# Patient Record
Sex: Female | Born: 1970 | Race: White | Hispanic: No | Marital: Married | Smoking: Former smoker
Health system: Southern US, Community
[De-identification: ages and names within clinical notes are randomized; demographics above are authoritative.]

## PROBLEM LIST (undated history)

## (undated) ENCOUNTER — Ambulatory Visit: Admission: EM | Source: Home / Self Care

## (undated) DIAGNOSIS — F419 Anxiety disorder, unspecified: Secondary | ICD-10-CM

## (undated) DIAGNOSIS — I340 Nonrheumatic mitral (valve) insufficiency: Secondary | ICD-10-CM

## (undated) DIAGNOSIS — T7840XA Allergy, unspecified, initial encounter: Secondary | ICD-10-CM

## (undated) DIAGNOSIS — R079 Chest pain, unspecified: Secondary | ICD-10-CM

## (undated) DIAGNOSIS — G629 Polyneuropathy, unspecified: Secondary | ICD-10-CM

## (undated) DIAGNOSIS — F329 Major depressive disorder, single episode, unspecified: Secondary | ICD-10-CM

## (undated) DIAGNOSIS — I071 Rheumatic tricuspid insufficiency: Secondary | ICD-10-CM

## (undated) DIAGNOSIS — I10 Essential (primary) hypertension: Secondary | ICD-10-CM

## (undated) DIAGNOSIS — J449 Chronic obstructive pulmonary disease, unspecified: Secondary | ICD-10-CM

## (undated) DIAGNOSIS — M1990 Unspecified osteoarthritis, unspecified site: Secondary | ICD-10-CM

## (undated) DIAGNOSIS — E785 Hyperlipidemia, unspecified: Secondary | ICD-10-CM

## (undated) DIAGNOSIS — Z87442 Personal history of urinary calculi: Secondary | ICD-10-CM

## (undated) DIAGNOSIS — N12 Tubulo-interstitial nephritis, not specified as acute or chronic: Secondary | ICD-10-CM

## (undated) DIAGNOSIS — K219 Gastro-esophageal reflux disease without esophagitis: Secondary | ICD-10-CM

## (undated) DIAGNOSIS — J45909 Unspecified asthma, uncomplicated: Secondary | ICD-10-CM

## (undated) DIAGNOSIS — F32A Depression, unspecified: Secondary | ICD-10-CM

## (undated) DIAGNOSIS — M810 Age-related osteoporosis without current pathological fracture: Secondary | ICD-10-CM

## (undated) DIAGNOSIS — F319 Bipolar disorder, unspecified: Secondary | ICD-10-CM

## (undated) DIAGNOSIS — G4700 Insomnia, unspecified: Secondary | ICD-10-CM

## (undated) HISTORY — DX: Unspecified osteoarthritis, unspecified site: M19.90

## (undated) HISTORY — DX: Anxiety disorder, unspecified: F41.9

## (undated) HISTORY — DX: Major depressive disorder, single episode, unspecified: F32.9

## (undated) HISTORY — DX: Chronic obstructive pulmonary disease, unspecified: J44.9

## (undated) HISTORY — PX: CERVICAL FUSION: SHX112

## (undated) HISTORY — PX: SPINE SURGERY: SHX786

## (undated) HISTORY — PX: TUBAL LIGATION: SHX77

## (undated) HISTORY — DX: Age-related osteoporosis without current pathological fracture: M81.0

## (undated) HISTORY — PX: APPENDECTOMY: SHX54

## (undated) HISTORY — DX: Hyperlipidemia, unspecified: E78.5

## (undated) HISTORY — PX: BACK SURGERY: SHX140

## (undated) HISTORY — DX: Polyneuropathy, unspecified: G62.9

## (undated) HISTORY — DX: Depression, unspecified: F32.A

## (undated) HISTORY — DX: Allergy, unspecified, initial encounter: T78.40XA

## (undated) HISTORY — DX: Insomnia, unspecified: G47.00

## (undated) HISTORY — DX: Bipolar disorder, unspecified: F31.9

## (undated) HISTORY — DX: Essential (primary) hypertension: I10

---
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
## 1998-10-28 HISTORY — PX: ABDOMINAL HYSTERECTOMY: SHX81

## 2005-10-21 ENCOUNTER — Emergency Department: Payer: Self-pay | Admitting: Emergency Medicine

## 2006-02-24 ENCOUNTER — Ambulatory Visit: Payer: Self-pay | Admitting: Ophthalmology

## 2007-02-01 ENCOUNTER — Inpatient Hospital Stay: Payer: Self-pay | Admitting: Unknown Physician Specialty

## 2007-12-07 ENCOUNTER — Emergency Department (HOSPITAL_COMMUNITY): Admission: EM | Admit: 2007-12-07 | Discharge: 2007-12-08 | Payer: Self-pay | Admitting: Family Medicine

## 2009-09-27 ENCOUNTER — Emergency Department (HOSPITAL_COMMUNITY): Admission: EM | Admit: 2009-09-27 | Discharge: 2009-09-27 | Payer: Self-pay | Admitting: Emergency Medicine

## 2010-01-16 ENCOUNTER — Ambulatory Visit (HOSPITAL_COMMUNITY): Admission: RE | Admit: 2010-01-16 | Discharge: 2010-01-16 | Payer: Self-pay | Admitting: Neurosurgery

## 2010-01-25 ENCOUNTER — Ambulatory Visit (HOSPITAL_COMMUNITY): Admission: RE | Admit: 2010-01-25 | Discharge: 2010-01-26 | Payer: Self-pay | Admitting: Neurosurgery

## 2010-03-14 ENCOUNTER — Encounter: Payer: Self-pay | Admitting: Neurosurgery

## 2010-03-28 ENCOUNTER — Encounter: Payer: Self-pay | Admitting: Neurosurgery

## 2010-04-27 ENCOUNTER — Encounter: Payer: Self-pay | Admitting: Neurosurgery

## 2010-06-12 ENCOUNTER — Ambulatory Visit (HOSPITAL_COMMUNITY): Admission: RE | Admit: 2010-06-12 | Discharge: 2010-06-12 | Payer: Self-pay | Admitting: Neurosurgery

## 2010-11-18 ENCOUNTER — Encounter: Payer: Self-pay | Admitting: Neurosurgery

## 2011-01-20 LAB — URINALYSIS, ROUTINE W REFLEX MICROSCOPIC
Glucose, UA: NEGATIVE mg/dL
Hgb urine dipstick: NEGATIVE
Ketones, ur: NEGATIVE mg/dL
Protein, ur: NEGATIVE mg/dL
pH: 6 (ref 5.0–8.0)

## 2011-01-20 LAB — BASIC METABOLIC PANEL
BUN: 14 mg/dL (ref 6–23)
CO2: 27 mEq/L (ref 19–32)
Calcium: 9.7 mg/dL (ref 8.4–10.5)
Chloride: 104 mEq/L (ref 96–112)
Creatinine, Ser: 0.87 mg/dL (ref 0.4–1.2)
Glucose, Bld: 84 mg/dL (ref 70–99)
Potassium: 4.3 mEq/L (ref 3.5–5.1)
Sodium: 138 mEq/L (ref 135–145)

## 2011-01-20 LAB — PROTIME-INR: Prothrombin Time: 12.8 seconds (ref 11.6–15.2)

## 2011-01-20 LAB — APTT: aPTT: 27 seconds (ref 24–37)

## 2011-01-20 LAB — CBC
RBC: 4.49 MIL/uL (ref 3.87–5.11)
RDW: 12.9 % (ref 11.5–15.5)
WBC: 8.2 10*3/uL (ref 4.0–10.5)

## 2011-01-20 LAB — SURGICAL PCR SCREEN: MRSA, PCR: NEGATIVE

## 2011-01-28 ENCOUNTER — Other Ambulatory Visit: Payer: Self-pay | Admitting: Neurosurgery

## 2011-01-28 DIAGNOSIS — M542 Cervicalgia: Secondary | ICD-10-CM

## 2011-02-01 ENCOUNTER — Ambulatory Visit
Admission: RE | Admit: 2011-02-01 | Discharge: 2011-02-01 | Disposition: A | Discharge status: Home / Self Care | Payer: Medicaid Other | Source: Ambulatory Visit | Attending: Neurosurgery | Admitting: Neurosurgery

## 2011-02-01 DIAGNOSIS — M542 Cervicalgia: Secondary | ICD-10-CM

## 2011-02-08 ENCOUNTER — Ambulatory Visit (HOSPITAL_COMMUNITY)
Admission: RE | Admit: 2011-02-08 | Discharge: 2011-02-08 | Disposition: A | Discharge status: Home / Self Care | Payer: Medicaid Other | Source: Ambulatory Visit | Attending: Neurosurgery | Admitting: Neurosurgery

## 2011-02-08 ENCOUNTER — Encounter (HOSPITAL_COMMUNITY)
Admission: RE | Admit: 2011-02-08 | Discharge: 2011-02-08 | Disposition: A | Discharge status: Home / Self Care | Payer: Medicaid Other | Source: Ambulatory Visit | Attending: Neurosurgery | Admitting: Neurosurgery

## 2011-02-08 ENCOUNTER — Other Ambulatory Visit (HOSPITAL_COMMUNITY): Payer: Self-pay | Admitting: Neurosurgery

## 2011-02-08 DIAGNOSIS — Z01810 Encounter for preprocedural cardiovascular examination: Secondary | ICD-10-CM | POA: Insufficient documentation

## 2011-02-08 DIAGNOSIS — Z01818 Encounter for other preprocedural examination: Secondary | ICD-10-CM | POA: Insufficient documentation

## 2011-02-08 DIAGNOSIS — M5030 Other cervical disc degeneration, unspecified cervical region: Secondary | ICD-10-CM

## 2011-02-08 DIAGNOSIS — Z01812 Encounter for preprocedural laboratory examination: Secondary | ICD-10-CM | POA: Insufficient documentation

## 2011-02-08 DIAGNOSIS — M479 Spondylosis, unspecified: Secondary | ICD-10-CM

## 2011-02-08 DIAGNOSIS — M542 Cervicalgia: Secondary | ICD-10-CM

## 2011-02-08 LAB — BASIC METABOLIC PANEL
BUN: 9 mg/dL (ref 6–23)
CO2: 28 mEq/L (ref 19–32)
Chloride: 102 mEq/L (ref 96–112)
Creatinine, Ser: 0.85 mg/dL (ref 0.4–1.2)
GFR calc non Af Amer: >60 mL/min (ref >60)
Glucose, Bld: 56 mg/dL — ABNORMAL LOW (ref 70–99)
Potassium: 3.6 mEq/L (ref 3.5–5.1)

## 2011-02-08 LAB — URINALYSIS, ROUTINE W REFLEX MICROSCOPIC
Glucose, UA: NEGATIVE mg/dL
Specific Gravity, Urine: 1.025 (ref 1.005–1.030)
Urobilinogen, UA: 0.2 mg/dL (ref 0.0–1.0)

## 2011-02-08 LAB — CBC
MCH: 30.4 pg (ref 26.0–34.0)
MCHC: 33.3 g/dL (ref 30.0–36.0)
MCV: 91.2 fL (ref 78.0–100.0)
Platelets: 235 10*3/uL (ref 150–400)
RBC: 4.41 MIL/uL (ref 3.87–5.11)

## 2011-02-11 ENCOUNTER — Inpatient Hospital Stay (HOSPITAL_COMMUNITY)
Admission: RE | Admit: 2011-02-11 | Discharge: 2011-02-12 | DRG: 472 | Disposition: A | Discharge status: Home / Self Care | Payer: Medicaid Other | Source: Ambulatory Visit | Attending: Neurosurgery | Admitting: Neurosurgery

## 2011-02-11 ENCOUNTER — Inpatient Hospital Stay (HOSPITAL_COMMUNITY): Payer: Medicaid Other

## 2011-02-11 DIAGNOSIS — Z01812 Encounter for preprocedural laboratory examination: Secondary | ICD-10-CM

## 2011-02-11 DIAGNOSIS — T84498A Other mechanical complication of other internal orthopedic devices, implants and grafts, initial encounter: Secondary | ICD-10-CM | POA: Diagnosis present

## 2011-02-11 DIAGNOSIS — Y831 Surgical operation with implant of artificial internal device as the cause of abnormal reaction of the patient, or of later complication, without mention of misadventure at the time of the procedure: Secondary | ICD-10-CM | POA: Diagnosis present

## 2011-02-11 DIAGNOSIS — I10 Essential (primary) hypertension: Secondary | ICD-10-CM | POA: Diagnosis present

## 2011-02-11 DIAGNOSIS — K219 Gastro-esophageal reflux disease without esophagitis: Secondary | ICD-10-CM | POA: Diagnosis present

## 2011-02-11 DIAGNOSIS — M5000 Cervical disc disorder with myelopathy, unspecified cervical region: Principal | ICD-10-CM | POA: Diagnosis present

## 2011-02-11 DIAGNOSIS — F17200 Nicotine dependence, unspecified, uncomplicated: Secondary | ICD-10-CM | POA: Diagnosis present

## 2011-02-11 DIAGNOSIS — Z472 Encounter for removal of internal fixation device: Secondary | ICD-10-CM

## 2011-02-11 DIAGNOSIS — Y92009 Unspecified place in unspecified non-institutional (private) residence as the place of occurrence of the external cause: Secondary | ICD-10-CM

## 2011-02-15 NOTE — Op Note (Signed)
Misty Robbins, Misty Robbins                ACCOUNT NO.:  [id number]  MEDICAL RECORD NO.:  [id number]           PATIENT TYPE:  I  LOCATION:  3535                         FACILITY:  [facility]  PHYSICIAN:  Clydene Fake, M.D.  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  The patient with radiculopathy also.  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  The patient with radiculopathy also with nonunion C5-6, C6-7.  PROCEDURE:  Removal of plate C5 through C7, exploration of possible nonunion at C5-6 and C6-7, C5 corpectomy with fusion of the C4-5 and C5- 6.  The interbody strut graft at C4-5 and C5-6 with autograft from the same incision, allograft, and microdissection.  Redo anterior cervical decompression, diskectomy, and fusion at C6-7 with interbody cages, C6- 7, autograft from the same incision, allograft.  Placement of a new anterior cervical plate from C4 through C7.  SURGEON:  Clydene Fake, MD  ASSISTANT:  Cristi Loron, MD  ANESTHESIA:  General endotracheal tube anesthesia.  ESTIMATED BLOOD LOSS:  Minimal.  BLOOD GIVEN:  None.  DRAINS:  None.  COMPLICATIONS:  None.  REASON FOR PROCEDURE:  The patient is a 40 year old woman who 14 months ago underwent 2-level ACF for myelopathy and radicular symptoms.  She has had some continued problems with symptoms since worsening recently with neck pain, left arm radicular pain, numbness, and some increase in myelopathy with numbness in her arms and legs.  MRI was done showing new kyphosis at C4-5 with central disk herniation with fragment going caudally behind  the body of C5 presumed by a collapse the C5-6 disk and then some compression even at the posterior C5-6 disk space.  CT showed probable nonunion at C5-6 and C6-7.  The patient was brought in for exploration of prior fusion and decompression and fusion of the C5 corpectomy.  PROCEDURE IN DETAIL:  The patient was brought into the operating room and general anesthesia was induced.  The patient was placed in 10 pounds halter traction, prepped and draped in sterile fashion.  Site of incision injected with 8 mL of 1% lidocaine with epinephrine.  Incision was then made at the site of previous incision in the midline to the anterior border of the sternocleidomastoid muscle on the left side. Next, the incision was taken down to the platysma and hemostasis was obtained with Bovie cauterization.  The platysma was incised with the Bovie and blunt dissection taken through the anterior cervical fascia to the anterior cervical spine.  Prior plate was found and we were able to dissect out the plate and exposed the next disk space higher than C4-5 space.  The C4-5 space was incised with a 15 blade and partial diskectomy done with pituitary rongeurs and then we removed the plate, removed the screws at C5, C6,  and C7 with removal of plates.  We then explored the disk spaces at C5-6.  There was a movement and there was clearly a nonunion at the C5-6 level and at the C6-7 level.  Also, there was movement and a clear nonunion at the C6-7 level.  We were able to with a combination of the Bovie, drill and incise the disk space at C5- 6, and started diskectomy with pituitary rongeurs and curettes.  We mobilized the longus coli muscle at C4 through C7 bilaterally.  Self- retaining retractor was placed, so we could see the C4 through C6 area. Distraction pin was then placed in the C4, another in the C6 interspaces and distracted.  Corpectomy was then done using Leksell rongeurs, osteophyte tool, and __________  bone, it was cleaned from soft tissue, chopped into small pieces, and left __________ for fusion later in the case.  Then, high-speed drill was used to remove the rest of the C5 body and using a __________ trap to keep the autograft there for, again, fusion later in the case.  The diskectomy at C4-5 continued with curettes and 1-2 mm Kerrison punches.  Microscope was brought for microdissection and continued diskectomy into C4-5.  We did foraminotomies, and removed the posterior ligaments exposing the dura and decompressing the canal and __________.  We then continued the corpectomy at C5 removing the posterior aspect of the vertebral body and the hypertrophic ligaments, and also floating fragments going all the way down to the C5-6 disk space, which was removed.  We decompressed the canal and the dura.  As we got to the C5-6 space, there was a significant amount of scar and soft tissue somewhat stuck to the dura, this may have been __________ collapsed bone graft that actually retropulsed slightly.  We carefully dissected this away from the dura and removed it decompressing the central canal at C5-6 level and then we did bilateral foraminotomies making sure the foramen removed at the C5-6 level.  When we were finished, we had good decompression of bilateral foramen at C4-5 and C5-6 level of the central canal.  We used high-speed drill to remove cartilaginous endplate and measured the space from C4 through C6, and then an [employer] PEEK interbody cage was packed with all the autograft bone.  The bone dust from the __________ and very small amounts of DBX putty that was packed into the cage and the cage was then tapped into place and countersunk about a millimeter or so.  It was in good firm position.  The distraction pins were removed.  Hemostasis was obtained with Gelfoam and thrombin.  Attention was then taken to the C6- 7 level where there was a clear nonunion.  We were able to drill  through the scar and the remnants of the bone graft, which were all significantly soft.  We used high-speed drill to again remove cartilaginous endplate and measured height of the disk space to be 5 mm and a 5-mm PEEK cage from [employer] was packed with the autograft, allograft, and bone mixture.  The bone was __________ tapped into position at C6-7 level.  We had good position of both cages and the weight was removed from traction and the cages were again in good firm position.  Possible anterior cervical plate was placed over the anterior cervical spine.  Two screws were placed into C7 and C6 at the prior screw holes and rescue screws were used for these __________ screws were placed into the C4.  These were all  finally tightened down.  A total lateral x-ray was obtained showing good position of the plates, screws, PEEK interbody cages from C4 through C7.  Retractors were removed.  We had good hemostasis.  We irrigated with antibiotic solution.  We still had good hemostasis and the platysma was closed with 3-0 Vicryl interrupted sutures.  Subcutaneous tissue was closed with the same and skin closed with benzoin, Steri-Strips.  Dressing was placed.  The patient was placed in a soft cervical collar, awoken from anesthesia, and transferred to recovery room in stable condition.          ______________________________ Clydene Fake, M.D.    JRH/MEDQ  D:  02/11/2011  T:  02/12/2011  Job:  [id number]  Electronically Signed by Colon Branch M.D. on 02/15/2011 04:44:41 PM

## 2011-07-19 LAB — DIFFERENTIAL
Basophils Absolute: 0
Basophils Relative: 0
Eosinophils Relative: 2
Monocytes Absolute: 0.6
Neutro Abs: 4.3

## 2011-07-19 LAB — CBC
MCHC: 34.2
MCV: 89.8
WBC: 9.2

## 2011-07-19 LAB — COMPREHENSIVE METABOLIC PANEL
AST: 18
Albumin: 3.9
Alkaline Phosphatase: 70
BUN: 7
Chloride: 103
Potassium: 3.5
Total Bilirubin: 0.6

## 2011-07-19 LAB — POCT URINALYSIS DIP (DEVICE)
Bilirubin Urine: NEGATIVE
Glucose, UA: NEGATIVE
Ketones, ur: NEGATIVE
Protein, ur: NEGATIVE
Specific Gravity, Urine: <=1.005
Urobilinogen, UA: 0.2

## 2011-10-29 HISTORY — PX: TONSILLECTOMY: SUR1361

## 2012-04-12 IMAGING — CT CT CERVICAL SPINE W/O CM
3 of 4 series · 11 of 20 positions shown, 13 images · non-contrast
Comparison: Plain films from [REDACTED], most recent
January 23, 2011. Preoperative MRI 01/16/2010.

CLINICAL DATA: Increasing neck pain since surgery December 2009.
Bilateral arm and shoulder pain.  Numbness in left arm.

CT CERVICAL SPINE WITHOUT CONTRAST
TECHNIQUE: Multidetector CT imaging of the cervical spine was
performed. Multiplanar CT image reconstructions were also
generated.

[Series 3: c spine bone · axial · 0.23mm/px · z∈[+100,+240]mm · 5 of 86 slices shown, 7 images]
[im 15/86  soft-tissue]
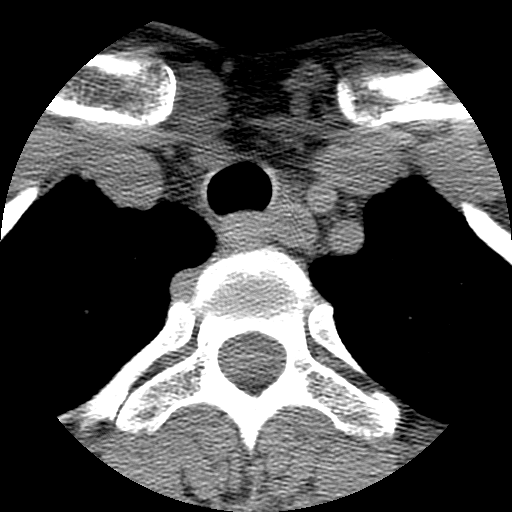
[im 15/86  bone]
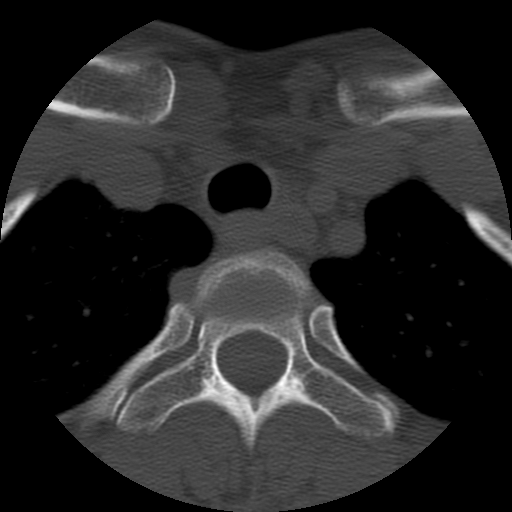
[im 29/86  bone]
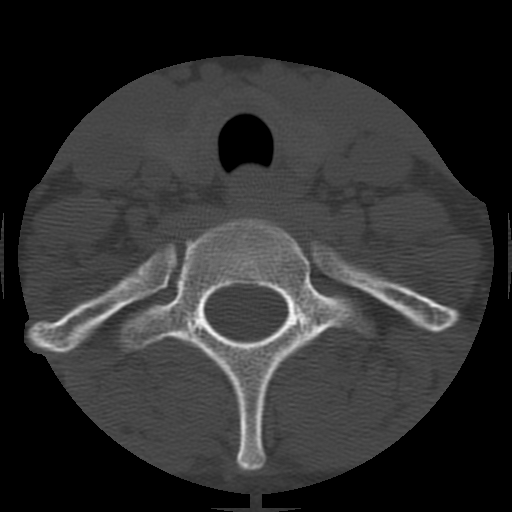
[im 43/86  bone]
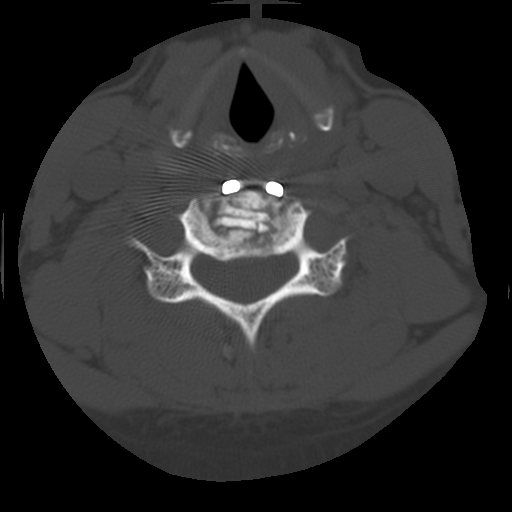
[im 57/86  bone]
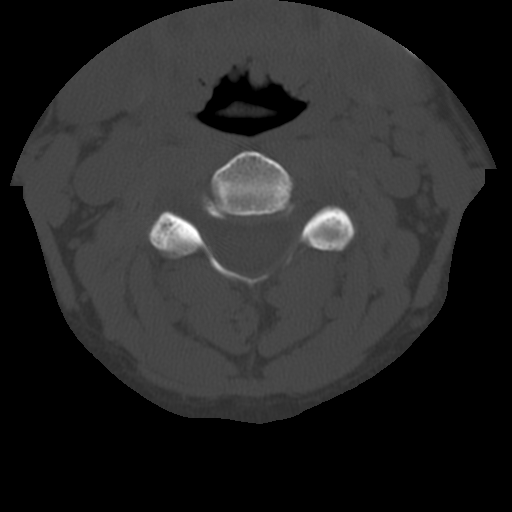
[im 71/86  soft-tissue]
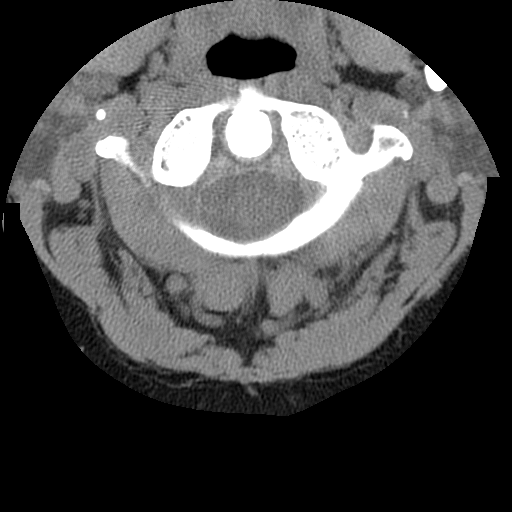
[im 71/86  bone]
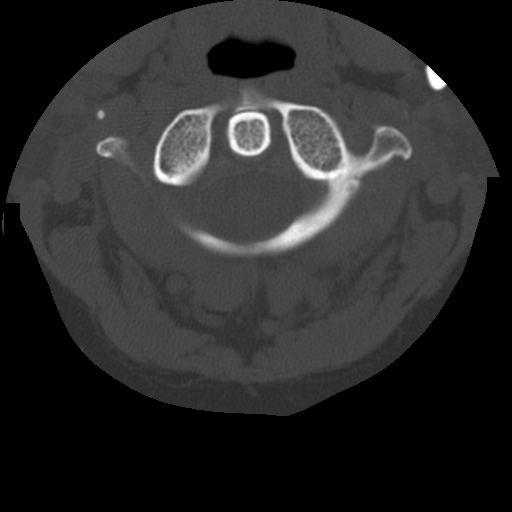

[Series 4: c spine soft · axial · 0.23mm/px · z∈[+98,+243]mm · 5 of 84 slices shown]
[im 14/84  soft-tissue]
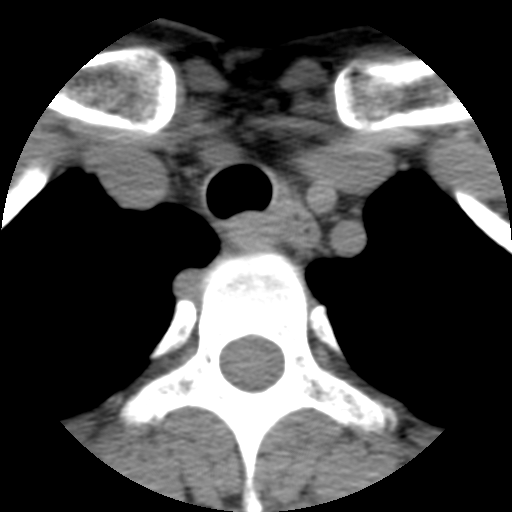
[im 28/84  soft-tissue]
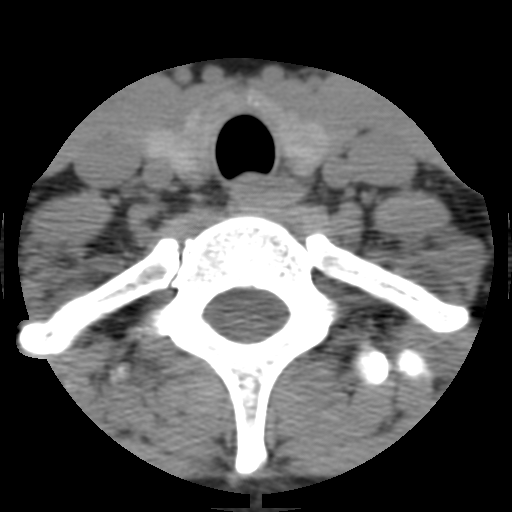
[im 42/84  soft-tissue]
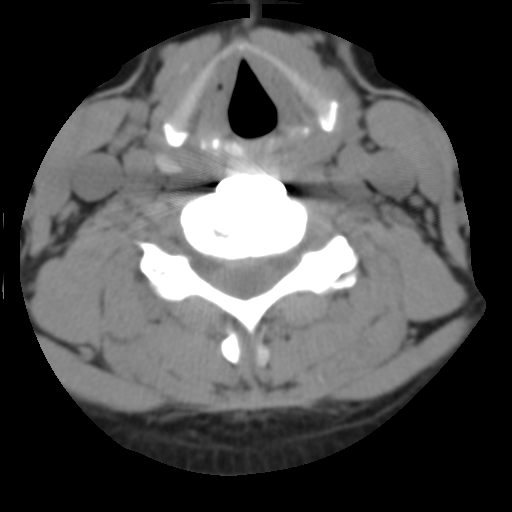
[im 56/84  soft-tissue]
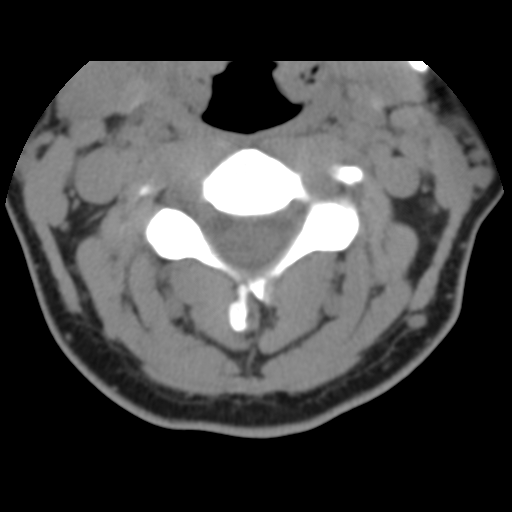
[im 70/84  soft-tissue]
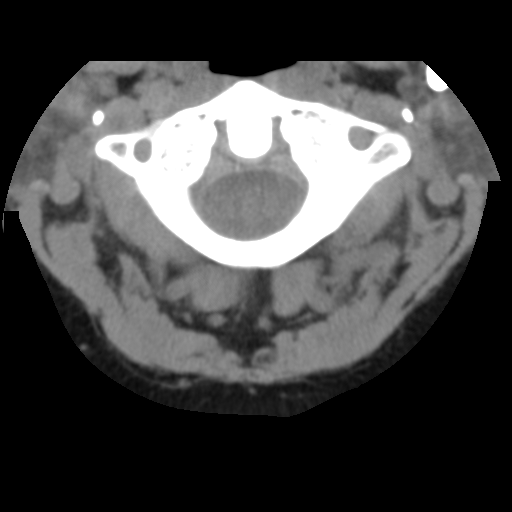

[Series 200: sagittal · sagittal · 0.43mm/px · 1 of 35 slices shown]
[im 18/35  bone]
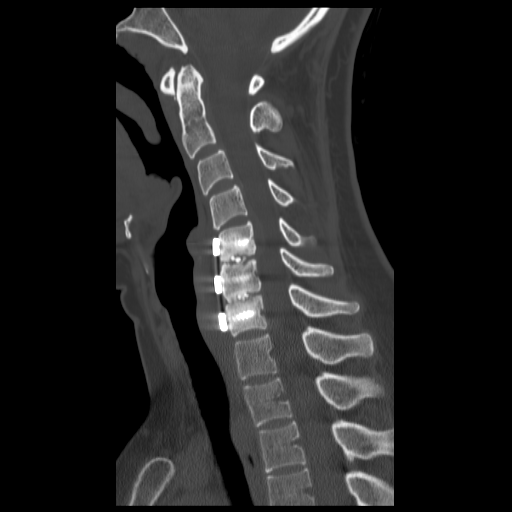

[11 of 20 positions shown; findings below may reference images not displayed]

FINDINGS: Anatomic alignment.  The upper cervical region is in a
position of mild forward flexion.

There has been previous C5-6 and C6-7 anterior cervical discectomy
with interbody fusion.  There is a nonunion at both levels.
Lucency extends through the interspace at both C5-6 and C6-7.
Collapse of allografts noted at both levels with loss of interspace
height is worse at C5-6.  Early loosening of the screws can be seen
at C7 bilaterally.

At C4-5, there is a large central and leftward disc extrusion not
present previously.  This appears to flatten the cord and narrowing
the sagittal diameter of the spinal canal to approximately 6 mm.
There may be left C5 nerve root encroachment.

The C2-3, C3-4, and C7-T1 levels appear unremarkable.

No neck masses.  No vascular calcification.  Airway midline.  Lung
apices clear.
IMPRESSION: Nonunion at C5-6 and C6-7 as described above.

Early screw loosening bilaterally at C7.

New central disc extrusion at C4-5 compresses the cord with canal
diameter of 6 mm.  No visible C5 nerve root encroachment.  This
appears to be a new finding from the preoperative MR 01/16/2010.

## 2012-08-11 ENCOUNTER — Ambulatory Visit: Payer: Self-pay | Admitting: Anesthesiology

## 2012-08-12 ENCOUNTER — Ambulatory Visit: Payer: Self-pay | Admitting: Otolaryngology

## 2012-10-09 ENCOUNTER — Emergency Department: Payer: Self-pay | Admitting: Emergency Medicine

## 2012-10-09 LAB — CBC
HGB: 14.2 g/dL (ref 12.0–16.0)
MCHC: 34.3 g/dL (ref 32.0–36.0)
Platelet: 222 10*3/uL (ref 150–440)
RBC: 4.69 10*6/uL (ref 3.80–5.20)

## 2012-10-09 LAB — URINALYSIS, COMPLETE
Glucose,UR: NEGATIVE mg/dL (ref 0–75)
Hyaline Cast: 4
Protein: NEGATIVE
RBC,UR: 27 /HPF (ref 0–5)
Specific Gravity: 1.023 (ref 1.003–1.030)

## 2012-10-09 LAB — COMPREHENSIVE METABOLIC PANEL
Alkaline Phosphatase: 123 U/L (ref 50–136)
BUN: 6 mg/dL — ABNORMAL LOW (ref 7–18)
Bilirubin,Total: 0.4 mg/dL (ref 0.2–1.0)
Chloride: 107 mmol/L (ref 98–107)
Co2: 27 mmol/L (ref 21–32)
Creatinine: 0.92 mg/dL (ref 0.60–1.30)
EGFR (Non-African Amer.): >60
Osmolality: 281 (ref 275–301)
SGPT (ALT): 16 U/L (ref 12–78)
Sodium: 140 mmol/L (ref 136–145)
Total Protein: 7.2 g/dL (ref 6.4–8.2)

## 2012-10-10 LAB — WBCS, STOOL

## 2012-10-10 LAB — CLOSTRIDIUM DIFFICILE BY PCR

## 2014-05-31 ENCOUNTER — Emergency Department: Payer: Self-pay | Admitting: Emergency Medicine

## 2014-07-03 ENCOUNTER — Emergency Department: Payer: Self-pay | Admitting: Emergency Medicine

## 2014-07-13 ENCOUNTER — Ambulatory Visit: Payer: Self-pay

## 2014-08-20 ENCOUNTER — Ambulatory Visit: Payer: Self-pay | Admitting: Unknown Physician Specialty

## 2014-09-21 ENCOUNTER — Ambulatory Visit: Payer: Self-pay | Admitting: Physician Assistant

## 2014-10-04 ENCOUNTER — Ambulatory Visit: Payer: Self-pay | Admitting: Physician Assistant

## 2014-10-19 ENCOUNTER — Ambulatory Visit: Payer: Self-pay | Admitting: Physician Assistant

## 2014-11-03 ENCOUNTER — Other Ambulatory Visit: Payer: Self-pay | Admitting: Internal Medicine

## 2014-11-03 DIAGNOSIS — R928 Other abnormal and inconclusive findings on diagnostic imaging of breast: Secondary | ICD-10-CM

## 2014-11-09 ENCOUNTER — Inpatient Hospital Stay: Admission: RE | Admit: 2014-11-09 | Payer: Medicaid Other | Source: Ambulatory Visit

## 2014-11-10 ENCOUNTER — Ambulatory Visit
Admission: RE | Admit: 2014-11-10 | Discharge: 2014-11-10 | Disposition: A | Discharge status: Home / Self Care | Payer: Medicaid Other | Source: Ambulatory Visit | Attending: Internal Medicine | Admitting: Internal Medicine

## 2014-11-10 DIAGNOSIS — R928 Other abnormal and inconclusive findings on diagnostic imaging of breast: Secondary | ICD-10-CM

## 2014-11-10 MED ORDER — GADOBENATE DIMEGLUMINE 529 MG/ML IV SOLN
15.0000 mL | Freq: Once | INTRAVENOUS | Status: AC | PRN
  Administered 2014-11-10: 15 mL via INTRAVENOUS

## 2015-10-11 ENCOUNTER — Other Ambulatory Visit: Payer: Self-pay | Admitting: Physician Assistant

## 2015-12-07 ENCOUNTER — Other Ambulatory Visit: Payer: Self-pay | Admitting: Physician Assistant

## 2015-12-07 ENCOUNTER — Other Ambulatory Visit
Admission: RE | Admit: 2015-12-07 | Discharge: 2015-12-07 | Disposition: A | Discharge status: Home / Self Care | Payer: Medicaid Other | Source: Ambulatory Visit | Attending: Physician Assistant | Admitting: Physician Assistant

## 2015-12-07 ENCOUNTER — Ambulatory Visit
Admission: RE | Admit: 2015-12-07 | Discharge: 2015-12-07 | Disposition: A | Discharge status: Home / Self Care | Payer: Medicaid Other | Source: Ambulatory Visit | Attending: Physician Assistant | Admitting: Physician Assistant

## 2015-12-07 DIAGNOSIS — M25561 Pain in right knee: Secondary | ICD-10-CM | POA: Diagnosis present

## 2015-12-07 LAB — COMPREHENSIVE METABOLIC PANEL
ALBUMIN: 3.7 g/dL (ref 3.5–5.0)
ALK PHOS: 101 U/L (ref 38–126)
ALT: 13 U/L — AB (ref 14–54)
ANION GAP: 7 (ref 5–15)
AST: 20 U/L (ref 15–41)
BILIRUBIN TOTAL: 0.4 mg/dL (ref 0.3–1.2)
BUN: 12 mg/dL (ref 6–20)
CALCIUM: 9 mg/dL (ref 8.9–10.3)
CO2: 30 mmol/L (ref 22–32)
CREATININE: 0.89 mg/dL (ref 0.44–1.00)
Chloride: 104 mmol/L (ref 101–111)
GFR calc Af Amer: >60 mL/min (ref >60)
GFR calc non Af Amer: >60 mL/min (ref >60)
GLUCOSE: 110 mg/dL — AB (ref 65–99)
Potassium: 3.8 mmol/L (ref 3.5–5.1)
SODIUM: 141 mmol/L (ref 135–145)
TOTAL PROTEIN: 7.4 g/dL (ref 6.5–8.1)

## 2015-12-07 LAB — SEDIMENTATION RATE: Sed Rate: 12 mm/hr (ref 0–20)

## 2015-12-07 LAB — CBC WITH DIFFERENTIAL/PLATELET
BASOS ABS: 0 10*3/uL (ref 0–0.1)
BASOS PCT: 1 %
EOS ABS: 0.3 10*3/uL (ref 0–0.7)
EOS PCT: 3 %
HCT: 41.4 % (ref 35.0–47.0)
Hemoglobin: 13.9 g/dL (ref 12.0–16.0)
Lymphocytes Relative: 32 %
Lymphs Abs: 3 10*3/uL (ref 1.0–3.6)
MCH: 29.5 pg (ref 26.0–34.0)
MCHC: 33.6 g/dL (ref 32.0–36.0)
MCV: 88 fL (ref 80.0–100.0)
MONO ABS: 0.5 10*3/uL (ref 0.2–0.9)
MONOS PCT: 6 %
NEUTROS ABS: 5.6 10*3/uL (ref 1.4–6.5)
Neutrophils Relative %: 58 %
PLATELETS: 184 10*3/uL (ref 150–440)
RBC: 4.7 MIL/uL (ref 3.80–5.20)
RDW: 13.2 % (ref 11.5–14.5)
WBC: 9.5 10*3/uL (ref 3.6–11.0)

## 2015-12-07 LAB — URIC ACID: Uric Acid, Serum: 4.5 mg/dL (ref 2.3–6.6)

## 2015-12-08 LAB — RHEUMATOID FACTOR

## 2015-12-08 LAB — ANTINUCLEAR ANTIBODIES, IFA: ANTINUCLEAR ANTIBODIES, IFA: NEGATIVE

## 2016-01-23 ENCOUNTER — Other Ambulatory Visit: Payer: Self-pay | Admitting: Physician Assistant

## 2016-01-23 DIAGNOSIS — Z1231 Encounter for screening mammogram for malignant neoplasm of breast: Secondary | ICD-10-CM

## 2016-02-07 ENCOUNTER — Ambulatory Visit
Admission: RE | Admit: 2016-02-07 | Discharge: 2016-02-07 | Disposition: A | Discharge status: Home / Self Care | Payer: Medicaid Other | Source: Ambulatory Visit | Attending: Physician Assistant | Admitting: Physician Assistant

## 2016-02-07 DIAGNOSIS — Z1231 Encounter for screening mammogram for malignant neoplasm of breast: Secondary | ICD-10-CM

## 2016-04-15 ENCOUNTER — Other Ambulatory Visit: Payer: Self-pay | Admitting: Physician Assistant

## 2016-04-15 DIAGNOSIS — Z1231 Encounter for screening mammogram for malignant neoplasm of breast: Secondary | ICD-10-CM

## 2016-05-01 ENCOUNTER — Ambulatory Visit: Payer: Medicaid Other | Attending: Physician Assistant

## 2016-05-15 ENCOUNTER — Other Ambulatory Visit: Payer: Self-pay | Admitting: Physician Assistant

## 2016-05-15 DIAGNOSIS — N6452 Nipple discharge: Secondary | ICD-10-CM

## 2016-05-27 ENCOUNTER — Ambulatory Visit
Admission: RE | Admit: 2016-05-27 | Discharge: 2016-05-27 | Disposition: A | Discharge status: Home / Self Care | Payer: Medicaid Other | Source: Ambulatory Visit | Attending: Physician Assistant | Admitting: Physician Assistant

## 2016-05-27 DIAGNOSIS — N6452 Nipple discharge: Secondary | ICD-10-CM | POA: Diagnosis not present

## 2016-06-05 ENCOUNTER — Ambulatory Visit: Payer: Self-pay | Admitting: General Surgery

## 2016-06-11 ENCOUNTER — Encounter: Payer: Self-pay | Admitting: *Deleted

## 2016-06-18 ENCOUNTER — Ambulatory Visit (INDEPENDENT_AMBULATORY_CARE_PROVIDER_SITE_OTHER): Payer: Medicaid Other | Admitting: General Surgery

## 2016-06-18 ENCOUNTER — Encounter: Payer: Self-pay | Admitting: General Surgery

## 2016-06-18 VITALS — BP 128/82 | HR 72 | Resp 12 | Ht 67.0 in | Wt 151.0 lb

## 2016-06-18 DIAGNOSIS — N6452 Nipple discharge: Secondary | ICD-10-CM | POA: Diagnosis not present

## 2016-06-18 NOTE — Progress Notes (Signed)
Patient ID: Misty Robbins, female   DOB: 06-18-1971, 45 y.o.   MRN: [id number]  Chief Complaint  Patient presents with  . Other    left breast discharge    HPI Misty Robbins is a 45 y.o. female who presents for a breast evaluation. The most recent mammogram was done on 05/27/16 and ultrasound. Patient states she is having left breast discharge white-yellow color. Patient noticed this about two years ago.  Patient does perform regular self breast checks and gets regular mammograms done.   I have reviewed the history of present illness with the patient.  HPI  Past Medical History:  Diagnosis Date  . Bipolar 1 disorder ([place])   . Hyperlipidemia   . Hypertension   . Insomnia     Past Surgical History:  Procedure Laterality Date  . ABDOMINAL HYSTERECTOMY  2000  . CERVICAL FUSION  [id number]  . SKIN GRAFT  1981  . TONSILLECTOMY  2013    Family History  Problem Relation Age of Onset  . Breast cancer Paternal Aunt     104's    Social History Social History  Substance Use Topics  . Smoking status: Current Every Day Smoker    Years: 25.00    Types: Cigarettes  . Smokeless tobacco: Never Used  . Alcohol use No    Allergies  Allergen Reactions  . Hydrocodone Nausea And Vomiting  . Pseudoephedrine Other (See Comments)    Current Outpatient Prescriptions  Medication Sig Dispense Refill  . ARIPiprazole (ABILIFY) 20 MG tablet Take by mouth.    Marland Kitchen buPROPion (WELLBUTRIN XL) 150 MG 24 hr tablet     . calcium carbonate (TUMS - DOSED IN MG ELEMENTAL CALCIUM) 500 MG chewable tablet Chew 1 tablet by mouth daily.    Marland Kitchen estrogens, conjugated, (PREMARIN) 1.25 MG tablet     . gabapentin (NEURONTIN) 300 MG capsule     . hydrochlorothiazide (MICROZIDE) 12.5 MG capsule Take by mouth.    Marland Kitchen HYDROmorphone (DILAUDID) 2 MG tablet Take by mouth.    . linaclotide (LINZESS) 145 MCG CAPS capsule Take 145 mcg by mouth daily before breakfast.    . metoprolol (LOPRESSOR) 50 MG tablet     . oxyCODONE  30 MG 12 hr tablet Take by mouth.    . pantoprazole (PROTONIX) 40 MG tablet     . pravastatin (PRAVACHOL) 40 MG tablet     . tizanidine (ZANAFLEX) 2 MG capsule Take 2 mg by mouth 3 (three) times daily.    . traZODone (DESYREL) 50 MG tablet      No current facility-administered medications for this visit.     Review of Systems Review of Systems  Constitutional: Negative.   Respiratory: Negative.   Cardiovascular: Negative.     Blood pressure 128/82, pulse 72, resp. rate 12, height 5\' 7"  (1.702 m), weight 151 lb (68.5 kg).  Physical Exam Physical Exam  Constitutional: She is oriented to person, place, and time. She appears well-developed and well-nourished.  Eyes: Conjunctivae are normal. No scleral icterus.  Neck: Neck supple.  Cardiovascular: Normal rate, regular rhythm and normal heart sounds.   Pulmonary/Chest: Effort normal and breath sounds normal. Right breast exhibits no inverted nipple, no mass, no nipple discharge, no skin change and no tenderness. Left breast exhibits inverted nipple and nipple discharge ( one drop of yellow discharge). Left breast exhibits no mass, no skin change and no tenderness.  Abdominal: Soft. Bowel sounds are normal. There is no tenderness.  Lymphadenopathy:  She has no cervical adenopathy.  Neurological: She is alert and oriented to person, place, and time.  Skin: Skin is warm and dry.    Data Reviewed Notes , mammogram and ultrasound reviewed-no findings  Assessment       Left breast discharge-likely benign. Pt advised to call if she notes any bloody or watery drainage.  Advised she needs routine yrly mammogram and breast exam Plan    Patient to return as needed.    This information has been scribed by Gaspar Cola CMA.    SANKAR,SEEPLAPUTHUR G 06/20/2016, 8:46 AM

## 2016-06-18 NOTE — Patient Instructions (Signed)
Return as needed

## 2016-12-13 DIAGNOSIS — R05 Cough: Secondary | ICD-10-CM | POA: Diagnosis not present

## 2016-12-13 DIAGNOSIS — E782 Mixed hyperlipidemia: Secondary | ICD-10-CM | POA: Diagnosis not present

## 2016-12-13 DIAGNOSIS — B070 Plantar wart: Secondary | ICD-10-CM | POA: Diagnosis not present

## 2016-12-13 DIAGNOSIS — M5116 Intervertebral disc disorders with radiculopathy, lumbar region: Secondary | ICD-10-CM | POA: Diagnosis not present

## 2016-12-13 DIAGNOSIS — R7301 Impaired fasting glucose: Secondary | ICD-10-CM | POA: Diagnosis not present

## 2016-12-13 DIAGNOSIS — K5901 Slow transit constipation: Secondary | ICD-10-CM | POA: Diagnosis not present

## 2016-12-17 ENCOUNTER — Ambulatory Visit
Admission: RE | Admit: 2016-12-17 | Discharge: 2016-12-17 | Disposition: A | Discharge status: Home / Self Care | Payer: Medicare Other | Source: Ambulatory Visit | Attending: Internal Medicine | Admitting: Internal Medicine

## 2016-12-17 ENCOUNTER — Other Ambulatory Visit: Payer: Self-pay | Admitting: Internal Medicine

## 2016-12-17 DIAGNOSIS — N200 Calculus of kidney: Secondary | ICD-10-CM | POA: Insufficient documentation

## 2016-12-17 DIAGNOSIS — R05 Cough: Secondary | ICD-10-CM | POA: Insufficient documentation

## 2016-12-17 DIAGNOSIS — Z981 Arthrodesis status: Secondary | ICD-10-CM | POA: Diagnosis not present

## 2016-12-17 DIAGNOSIS — M545 Low back pain: Secondary | ICD-10-CM | POA: Diagnosis not present

## 2016-12-17 DIAGNOSIS — M47816 Spondylosis without myelopathy or radiculopathy, lumbar region: Secondary | ICD-10-CM | POA: Insufficient documentation

## 2016-12-17 DIAGNOSIS — R059 Cough, unspecified: Secondary | ICD-10-CM

## 2016-12-20 DIAGNOSIS — H6690 Otitis media, unspecified, unspecified ear: Secondary | ICD-10-CM | POA: Diagnosis not present

## 2016-12-20 DIAGNOSIS — R05 Cough: Secondary | ICD-10-CM | POA: Diagnosis not present

## 2016-12-20 DIAGNOSIS — M5116 Intervertebral disc disorders with radiculopathy, lumbar region: Secondary | ICD-10-CM | POA: Diagnosis not present

## 2016-12-30 DIAGNOSIS — B070 Plantar wart: Secondary | ICD-10-CM | POA: Diagnosis not present

## 2017-01-20 DIAGNOSIS — B070 Plantar wart: Secondary | ICD-10-CM | POA: Diagnosis not present

## 2017-01-23 DIAGNOSIS — H903 Sensorineural hearing loss, bilateral: Secondary | ICD-10-CM | POA: Diagnosis not present

## 2017-02-15 IMAGING — CR DG KNEE COMPLETE 4+V*R*
4 series · 5 of 5 positions shown · non-contrast
Comparison: None.

CLINICAL DATA: Right knee pain for 3 months.  Initial evaluation.

EXAM:
RIGHT KNEE - COMPLETE 4+ VIEW

[knee ap]
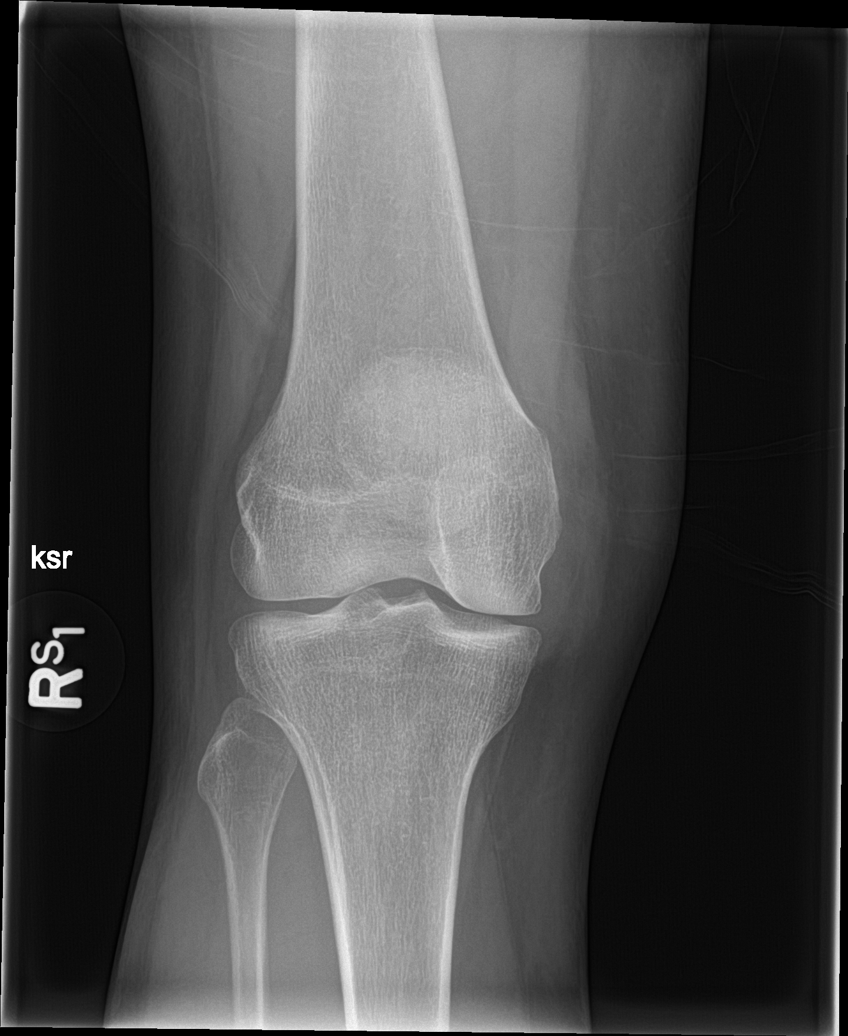

[knee obl (1 of 2)]
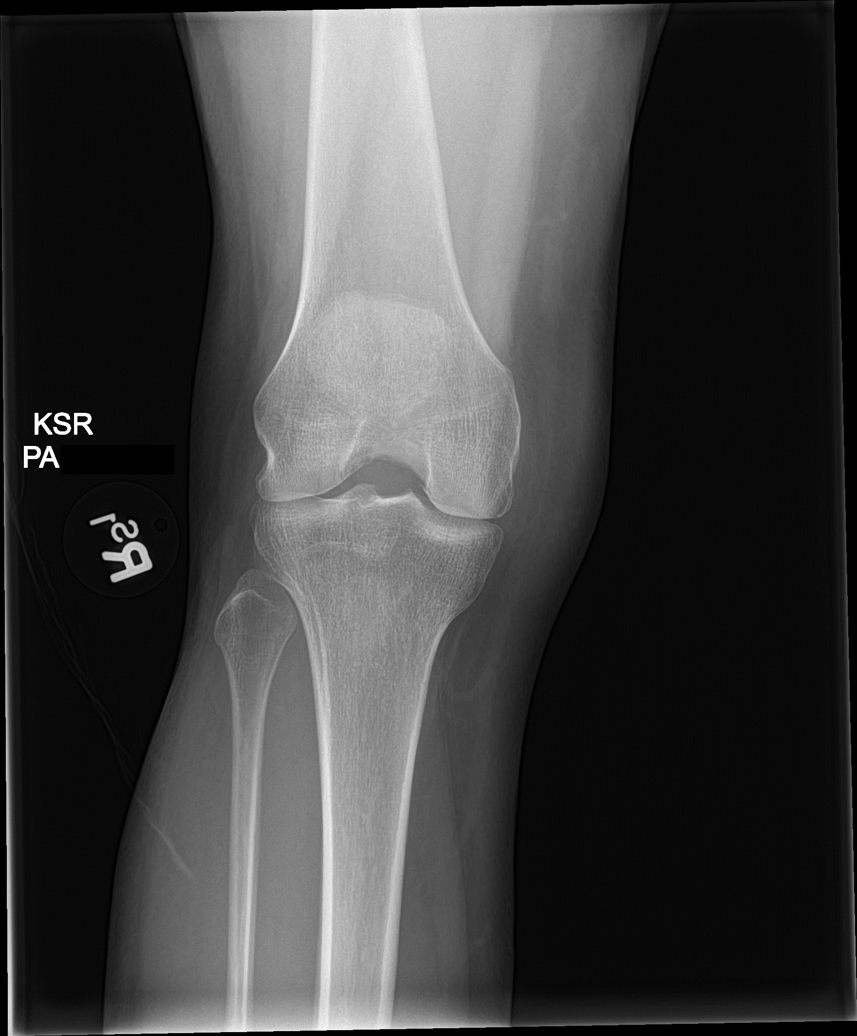

[Series 3: knee obl · 0.14mm/px · 2 of 2 slices shown (2 of 2)]
[im 1/2]
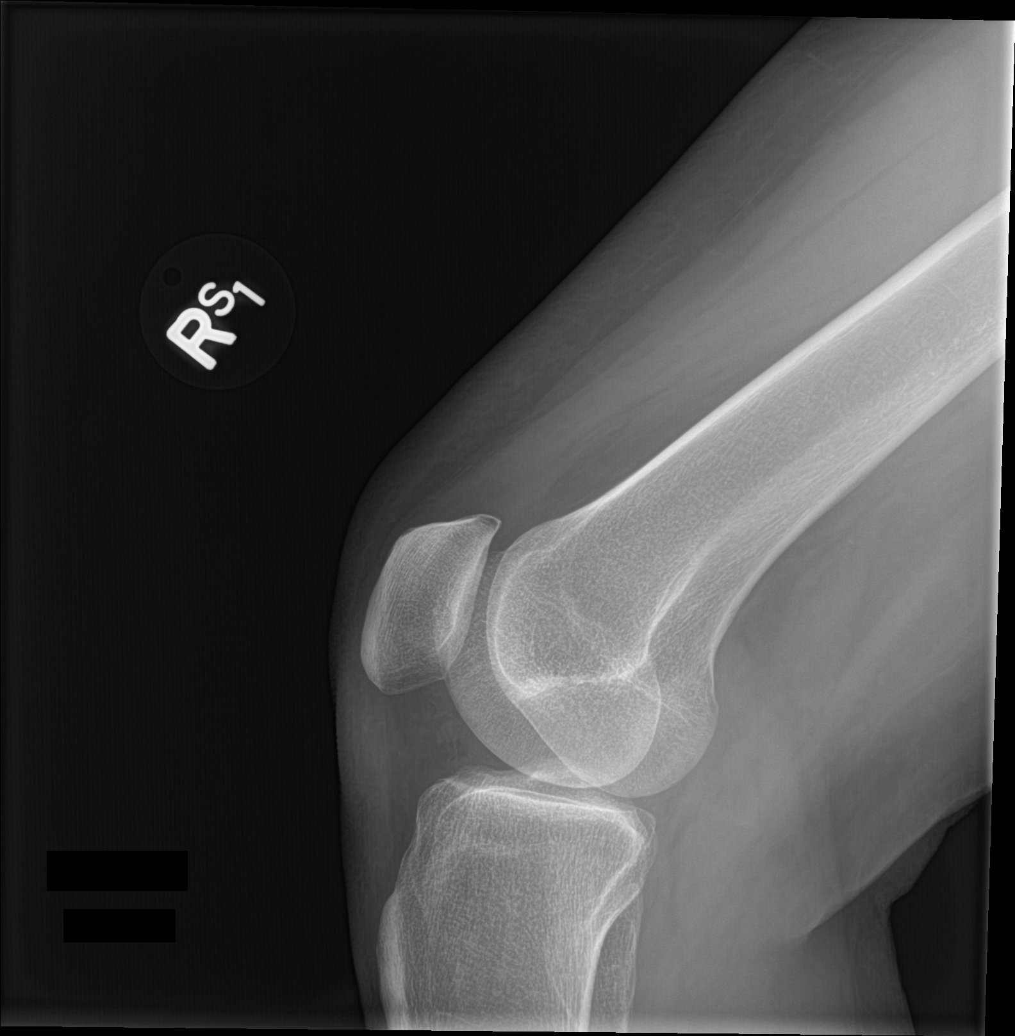
[im 2/2]
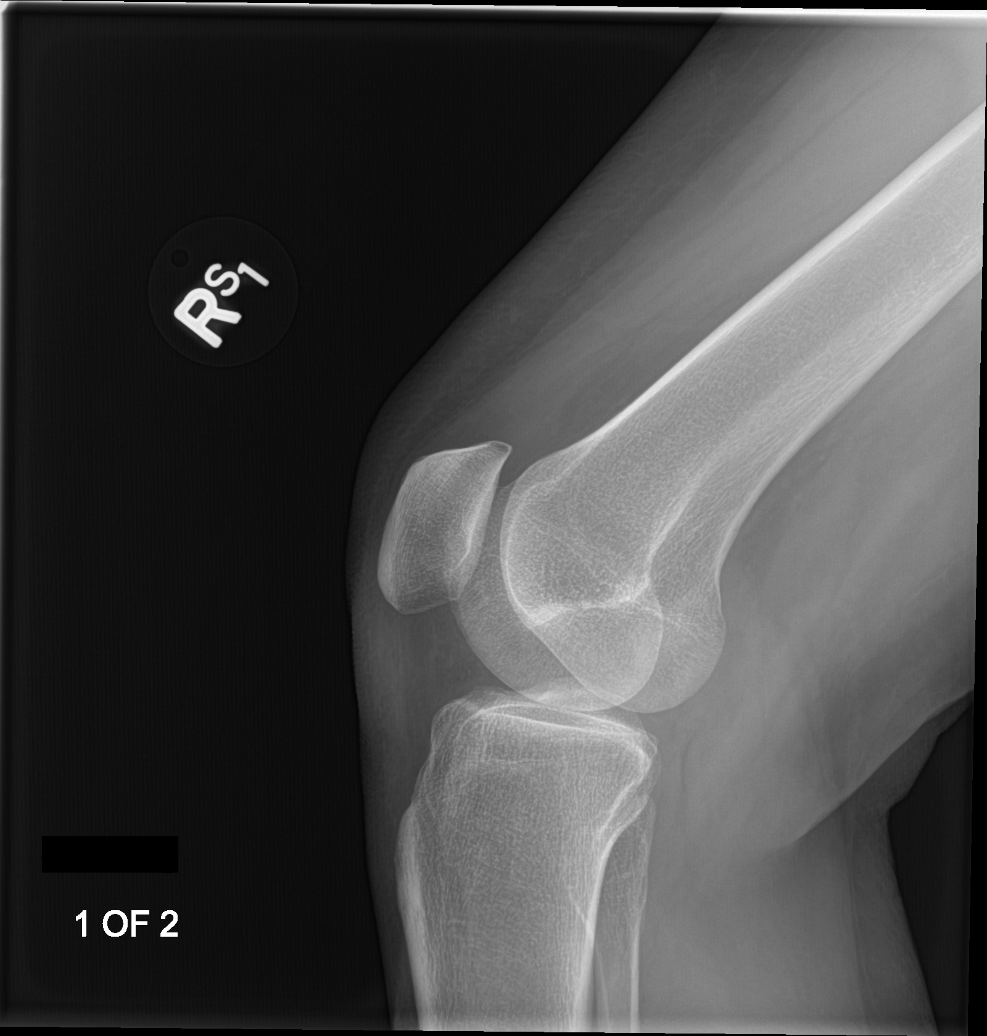

[sunrise]
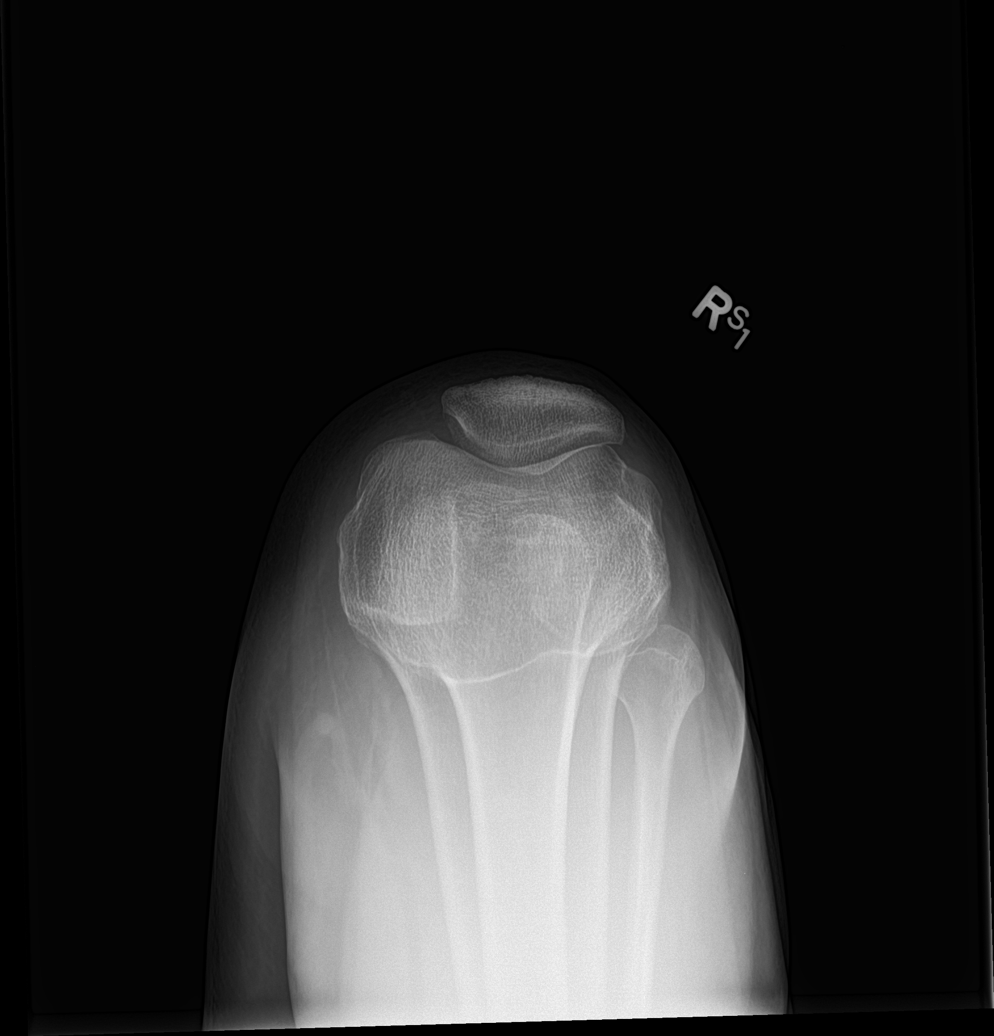

[5 of 5 positions shown; findings below may reference images not displayed]

FINDINGS: No acute bony or joint abnormality identified. No evidence of
fracture or dislocation. Small knee joint effusion cannot be
completely excluded.
IMPRESSION: No acute abnormality. Small knee joint effusion cannot be completely
excluded.

## 2017-04-16 DIAGNOSIS — M5116 Intervertebral disc disorders with radiculopathy, lumbar region: Secondary | ICD-10-CM | POA: Diagnosis not present

## 2017-04-16 DIAGNOSIS — J309 Allergic rhinitis, unspecified: Secondary | ICD-10-CM | POA: Diagnosis not present

## 2017-04-16 DIAGNOSIS — M5430 Sciatica, unspecified side: Secondary | ICD-10-CM | POA: Diagnosis not present

## 2017-04-16 DIAGNOSIS — I10 Essential (primary) hypertension: Secondary | ICD-10-CM | POA: Diagnosis not present

## 2017-04-25 ENCOUNTER — Other Ambulatory Visit: Payer: Self-pay | Admitting: Nurse Practitioner

## 2017-04-25 DIAGNOSIS — M545 Low back pain: Secondary | ICD-10-CM

## 2017-05-02 ENCOUNTER — Ambulatory Visit: Admission: RE | Admit: 2017-05-02 | Payer: Medicare Other | Source: Ambulatory Visit

## 2017-05-09 ENCOUNTER — Ambulatory Visit
Admission: RE | Admit: 2017-05-09 | Discharge: 2017-05-09 | Disposition: A | Discharge status: Home / Self Care | Payer: Medicare Other | Source: Ambulatory Visit | Attending: Nurse Practitioner | Admitting: Nurse Practitioner

## 2017-05-09 DIAGNOSIS — M545 Low back pain: Secondary | ICD-10-CM

## 2017-05-09 DIAGNOSIS — M48061 Spinal stenosis, lumbar region without neurogenic claudication: Secondary | ICD-10-CM | POA: Insufficient documentation

## 2017-05-29 DIAGNOSIS — M5116 Intervertebral disc disorders with radiculopathy, lumbar region: Secondary | ICD-10-CM | POA: Diagnosis not present

## 2017-05-29 DIAGNOSIS — I10 Essential (primary) hypertension: Secondary | ICD-10-CM | POA: Diagnosis not present

## 2017-05-29 DIAGNOSIS — M5430 Sciatica, unspecified side: Secondary | ICD-10-CM | POA: Diagnosis not present

## 2017-05-29 DIAGNOSIS — K219 Gastro-esophageal reflux disease without esophagitis: Secondary | ICD-10-CM | POA: Diagnosis not present

## 2017-06-18 DIAGNOSIS — K219 Gastro-esophageal reflux disease without esophagitis: Secondary | ICD-10-CM | POA: Diagnosis not present

## 2017-06-18 DIAGNOSIS — I10 Essential (primary) hypertension: Secondary | ICD-10-CM | POA: Diagnosis not present

## 2017-06-19 ENCOUNTER — Other Ambulatory Visit: Payer: Self-pay | Admitting: Family Medicine

## 2017-06-19 DIAGNOSIS — R1013 Epigastric pain: Secondary | ICD-10-CM

## 2017-06-23 ENCOUNTER — Other Ambulatory Visit: Payer: Self-pay | Admitting: Internal Medicine

## 2017-06-23 ENCOUNTER — Ambulatory Visit
Admission: RE | Admit: 2017-06-23 | Discharge: 2017-06-23 | Disposition: A | Discharge status: Home / Self Care | Payer: Medicare Other | Source: Ambulatory Visit | Attending: Family Medicine | Admitting: Family Medicine

## 2017-06-23 DIAGNOSIS — R1013 Epigastric pain: Secondary | ICD-10-CM

## 2017-06-23 DIAGNOSIS — K222 Esophageal obstruction: Secondary | ICD-10-CM | POA: Diagnosis not present

## 2017-09-10 DIAGNOSIS — M5416 Radiculopathy, lumbar region: Secondary | ICD-10-CM | POA: Diagnosis not present

## 2017-09-10 DIAGNOSIS — M47816 Spondylosis without myelopathy or radiculopathy, lumbar region: Secondary | ICD-10-CM | POA: Diagnosis not present

## 2017-09-10 DIAGNOSIS — M48061 Spinal stenosis, lumbar region without neurogenic claudication: Secondary | ICD-10-CM | POA: Diagnosis not present

## 2017-09-10 DIAGNOSIS — I10 Essential (primary) hypertension: Secondary | ICD-10-CM | POA: Diagnosis not present

## 2017-09-10 DIAGNOSIS — M545 Low back pain: Secondary | ICD-10-CM | POA: Diagnosis not present

## 2017-09-23 DIAGNOSIS — M48061 Spinal stenosis, lumbar region without neurogenic claudication: Secondary | ICD-10-CM | POA: Diagnosis not present

## 2017-09-23 DIAGNOSIS — M47816 Spondylosis without myelopathy or radiculopathy, lumbar region: Secondary | ICD-10-CM | POA: Diagnosis not present

## 2017-09-23 DIAGNOSIS — M5416 Radiculopathy, lumbar region: Secondary | ICD-10-CM | POA: Diagnosis not present

## 2017-09-23 DIAGNOSIS — M545 Low back pain: Secondary | ICD-10-CM | POA: Diagnosis not present

## 2017-09-23 DIAGNOSIS — I10 Essential (primary) hypertension: Secondary | ICD-10-CM | POA: Diagnosis not present

## 2017-10-30 ENCOUNTER — Other Ambulatory Visit: Payer: Self-pay

## 2017-10-30 MED ORDER — DEXLANSOPRAZOLE 60 MG PO CPDR: 60.0000 mg | DELAYED_RELEASE_CAPSULE | Freq: Every day | ORAL | 3 refills | Status: DC

## 2017-11-05 ENCOUNTER — Other Ambulatory Visit: Payer: Self-pay

## 2017-11-05 MED ORDER — CETIRIZINE HCL 10 MG PO TABS: 10.0000 mg | ORAL_TABLET | Freq: Every day | ORAL | 3 refills | Status: DC

## 2017-11-05 MED ORDER — METOPROLOL TARTRATE 50 MG PO TABS: 50.0000 mg | ORAL_TABLET | Freq: Two times a day (BID) | ORAL | 3 refills | Status: DC

## 2017-11-06 ENCOUNTER — Other Ambulatory Visit: Payer: Self-pay

## 2017-11-06 MED ORDER — METOPROLOL TARTRATE 50 MG PO TABS: 50.0000 mg | ORAL_TABLET | Freq: Two times a day (BID) | ORAL | 3 refills | Status: DC

## 2017-11-06 MED ORDER — DEXLANSOPRAZOLE 60 MG PO CPDR: 60.0000 mg | DELAYED_RELEASE_CAPSULE | Freq: Every day | ORAL | 3 refills | Status: DC

## 2017-11-06 MED ORDER — CETIRIZINE HCL 10 MG PO TABS: 10.0000 mg | ORAL_TABLET | Freq: Every day | ORAL | 3 refills | Status: DC

## 2017-12-05 ENCOUNTER — Other Ambulatory Visit: Payer: Self-pay

## 2017-12-05 MED ORDER — TRAZODONE HCL 100 MG PO TABS: 100.0000 mg | ORAL_TABLET | Freq: Every day | ORAL | 2 refills | Status: DC

## 2017-12-05 MED ORDER — ESTROGENS CONJUGATED 1.25 MG PO TABS: 1.2500 mg | ORAL_TABLET | Freq: Every day | ORAL | 3 refills | Status: DC

## 2018-01-05 ENCOUNTER — Other Ambulatory Visit: Payer: Self-pay

## 2018-01-05 MED ORDER — PREDNISONE 10 MG (21) PO TBPK: ORAL_TABLET | ORAL | 0 refills | Status: DC

## 2018-01-05 NOTE — Telephone Encounter (Signed)
Pt called that having flare back pain  As per dr Humphrey Rolls send prednisone and advised pt need follow up appt

## 2018-01-06 ENCOUNTER — Other Ambulatory Visit: Payer: Self-pay | Admitting: Internal Medicine

## 2018-01-30 ENCOUNTER — Other Ambulatory Visit: Payer: Self-pay | Admitting: Internal Medicine

## 2018-01-30 MED ORDER — PRAVASTATIN SODIUM 40 MG PO TABS: 40.0000 mg | ORAL_TABLET | Freq: Every day | ORAL | 2 refills | Status: DC

## 2018-02-17 ENCOUNTER — Encounter: Payer: Self-pay | Admitting: Internal Medicine

## 2018-02-17 ENCOUNTER — Ambulatory Visit (INDEPENDENT_AMBULATORY_CARE_PROVIDER_SITE_OTHER): Payer: Medicare Other | Admitting: Internal Medicine

## 2018-02-17 VITALS — BP 122/64 | HR 65 | Resp 16 | Ht 67.0 in | Wt 136.6 lb

## 2018-02-17 DIAGNOSIS — K219 Gastro-esophageal reflux disease without esophagitis: Secondary | ICD-10-CM

## 2018-02-17 DIAGNOSIS — L814 Other melanin hyperpigmentation: Secondary | ICD-10-CM | POA: Diagnosis not present

## 2018-02-17 DIAGNOSIS — M150 Primary generalized (osteo)arthritis: Secondary | ICD-10-CM | POA: Diagnosis not present

## 2018-02-17 DIAGNOSIS — M159 Polyosteoarthritis, unspecified: Secondary | ICD-10-CM

## 2018-02-17 DIAGNOSIS — R1319 Other dysphagia: Secondary | ICD-10-CM | POA: Diagnosis not present

## 2018-02-17 DIAGNOSIS — E119 Type 2 diabetes mellitus without complications: Secondary | ICD-10-CM

## 2018-02-17 DIAGNOSIS — N393 Stress incontinence (female) (male): Secondary | ICD-10-CM

## 2018-02-17 LAB — POCT GLYCOSYLATED HEMOGLOBIN (HGB A1C): Hemoglobin A1C: 5.4

## 2018-02-17 MED ORDER — RANITIDINE HCL 150 MG PO TABS
150.0000 mg | ORAL_TABLET | Freq: Two times a day (BID) | ORAL | 12 refills | Status: DC
Start: 2018-02-17 — End: 2019-04-06

## 2018-02-17 NOTE — Progress Notes (Signed)
[facility] [place], [place] [address]  Internal MEDICINE  Office Visit Note  Patient Name: Misty Robbins  [id number]  [id number]  Date of Service: 02/17/2018  Chief Complaint  Patient presents with  . dark spots    would like referral to dermatologist for dark spots on the face  and gi doctor having difficulty swallowing food. also having issues with urination  . Hypertension  . Hyperlipidemia  . Depression  . Anxiety    HPI  Pt is here for routine follow up.  Multiple cmplaints including heartburn, difficulty swallowing. She is on Dexilant and takes about 8 tums every day. Ongoing back pain. has to take steroids about 5-6 times per year, Steroid induced hyperglycemia.c/o stress incontinence. She is unable to control her urine. ?? prolapse    Current Medication: Outpatient Encounter Medications as of 02/17/2018  Medication Sig Note  . calcium carbonate (CALCIUM 600) 600 MG TABS tablet Take 800 mg by mouth 2 (two) times daily with a meal.   . Cetirizine HCl 10 MG CAPS Take by mouth.   . dexlansoprazole (DEXILANT) 60 MG capsule Take 1 capsule (60 mg total) by mouth daily.   Marland Kitchen estrogens, conjugated, (PREMARIN) 1.25 MG tablet Take 1 tablet (1.25 mg total) by mouth daily.   Marland Kitchen etodolac (LODINE) 500 MG tablet Take 500 mg by mouth 2 (two) times daily.   . metoprolol tartrate (LOPRESSOR) 50 MG tablet Take 1 tablet (50 mg total) by mouth 2 (two) times daily.   . metoprolol tartrate (LOPRESSOR) 50 MG tablet TAKE 1 TABLET(50 MG) BY MOUTH TWICE DAILY   . niacin 250 MG tablet Take 250 mg by mouth at bedtime.   . pravastatin (PRAVACHOL) 40 MG tablet Take 1 tablet (40 mg total) by mouth daily.   . traZODone (DESYREL) 100 MG tablet Take 1 tablet (100 mg total) by mouth at bedtime.   . pantoprazole (PROTONIX) 40 MG tablet  06/18/2016: Received from: [facility]  . ranitidine (RANITIDINE 150 MAX STRENGTH) 150 MG tablet Take 1 tablet (150 mg total) by  mouth 2 (two) times daily.   . tizanidine (ZANAFLEX) 2 MG capsule Take 2 mg by mouth 3 (three) times daily.   . [DISCONTINUED] ARIPiprazole (ABILIFY) 20 MG tablet Take by mouth. 06/18/2016: Received from: [place]: Take 20 mg by mouth once daily.  . [DISCONTINUED] buPROPion (WELLBUTRIN XL) 150 MG 24 hr tablet  06/18/2016: Received from: [facility]  . [DISCONTINUED] calcium carbonate (TUMS - DOSED IN MG ELEMENTAL CALCIUM) 500 MG chewable tablet Chew 1 tablet by mouth daily.   . [DISCONTINUED] cetirizine (ZYRTEC) 10 MG tablet Take 1 tablet (10 mg total) by mouth daily. (Patient not taking: Reported on 02/17/2018)   . [DISCONTINUED] gabapentin (NEURONTIN) 300 MG capsule  06/18/2016: Received from: [facility]  . [DISCONTINUED] hydrochlorothiazide (MICROZIDE) 12.5 MG capsule Take by mouth. 06/18/2016: Received from: [place]: Take 12.5 mg by mouth once daily.  . [DISCONTINUED] HYDROmorphone (DILAUDID) 2 MG tablet Take by mouth. 06/18/2016: Received from: [place]: Take 2 mg by mouth every 4 (four) hours as needed for Pain.  . [DISCONTINUED] linaclotide (LINZESS) 145 MCG CAPS capsule Take 145 mcg by mouth daily before breakfast.   . [DISCONTINUED] oxyCODONE 30 MG 12 hr tablet Take by mouth. 06/18/2016: Received from: [place]: Take 30 mg by mouth every 12 (twelve) hours.  . [  DISCONTINUED] predniSONE (STERAPRED UNI-PAK 21 TAB) 10 MG (21) TBPK tablet Take as directed for 6 days    No facility-administered encounter medications on file as of 02/17/2018.     Surgical History: Past Surgical History:  Procedure Laterality Date  . ABDOMINAL HYSTERECTOMY  2000  . CERVICAL FUSION  [id number]  . SKIN GRAFT  1981  . TONSILLECTOMY  2013    Medical History: Past Medical History:  Diagnosis Date  . Allergy   . Anxiety   . Arthritis   . Bipolar  1 disorder ([place])   . Depression   . Hyperlipidemia   . Hypertension   . Insomnia   . Osteoporosis   . Peripheral neuropathy     Family History: Family History  Problem Relation Age of Onset  . Breast cancer Paternal Aunt        25's  . Heart disease Mother   . Hypertension Mother   . Stroke Mother   . Stroke Father   . Cancer Father   . Heart disease Father   . Diabetes Sister   . Diabetes Brother     Social History   Socioeconomic History  . Marital status: Married    Spouse name: Not on file  . Number of children: Not on file  . Years of education: Not on file  . Highest education level: Not on file  Occupational History  . Not on file  Social Needs  . Financial resource strain: Not on file  . Food insecurity:    Worry: Not on file    Inability: Not on file  . Transportation needs:    Medical: Not on file    Non-medical: Not on file  Tobacco Use  . Smoking status: Current Every Day Smoker    Years: 25.00    Types: Cigarettes  . Smokeless tobacco: Never Used  Substance and Sexual Activity  . Alcohol use: No  . Drug use: No  . Sexual activity: Not on file  Lifestyle  . Physical activity:    Days per week: Not on file    Minutes per session: Not on file  . Stress: Not on file  Relationships  . Social connections:    Talks on phone: Not on file    Gets together: Not on file    Attends religious service: Not on file    Active member of club or organization: Not on file    Attends meetings of clubs or organizations: Not on file    Relationship status: Not on file  . Intimate partner violence:    Fear of current or ex partner: Not on file    Emotionally abused: Not on file    Physically abused: Not on file    Forced sexual activity: Not on file  Other Topics Concern  . Not on file  Social History Narrative  . Not on file   Review of Systems  Constitutional: Negative for chills, diaphoresis and fatigue.  HENT: Negative for ear pain, postnasal drip  and sinus pressure.   Eyes: Negative for photophobia, discharge, redness, itching and visual disturbance.  Respiratory: Negative for cough, shortness of breath and wheezing.   Cardiovascular: Negative for chest pain, palpitations and leg swelling.  Gastrointestinal: Negative for abdominal pain, constipation, diarrhea and vomiting.       Heartburn, swallowing difficulty with solids   Genitourinary: Negative for dysuria and flank pain.  Musculoskeletal: Positive for arthralgias, back pain and joint swelling. Negative for gait problem and neck pain.  Skin: Negative for color change.  Allergic/Immunologic: Negative for environmental allergies and food allergies.  Neurological: Negative for dizziness and headaches.  Hematological: Does not bruise/bleed easily.  Psychiatric/Behavioral: Negative for agitation, behavioral problems (depression) and hallucinations.   Vital Signs: BP 122/64   Pulse 65   Resp 16   Ht 5\' 7"  (1.702 m)   Wt 136 lb 9.6 oz (62 kg)   SpO2 99%   BMI 21.39 kg/m   Physical Exam  Constitutional: She is oriented to person, place, and time. She appears well-developed and well-nourished.  HENT:  Head: Normocephalic and atraumatic.  Scar on the neck   Eyes: Pupils are equal, round, and reactive to light. EOM are normal.  Neck: Normal range of motion. Neck supple.  Cardiovascular: Normal rate.  Pulmonary/Chest: Effort normal.  Abdominal: Soft.  Musculoskeletal: She exhibits tenderness.  Neurological: She is oriented to person, place, and time.  Skin: Skin is warm. Rash noted.  Psychiatric: She has a normal mood and affect. Her behavior is normal.   Assessment/Plan: 1. Diabetes mellitus without complication ([place]) - POCT HgB A1C. Steroids induced   2. Age spots - Ambulatory referral to Dermatology  3. Other dysphagia - DG Esophagus; Future - Ambulatory referral to Gastroenterology  4. GERD without esophagitis - DG UGI W/O KUB; Future. Continue Dexilant 60 mg  qd - Ambulatory referral to Gastroenterology - Add ranitidine (RANITIDINE 150 MAX STRENGTH) 150 MG tablet; Take 1 tablet (150 mg total) by mouth 2 (two) times daily.  Dispense: 60 tablet; Refill: 12  5. Stress incontinence (female) (female) - Ambulatory referral to Obstetrics / Gynecology for possible UV prolapse  6. Primary osteoarthritis involving multiple joints - etodolac (LODINE) 500 MG tablet; Take 500 mg by mouth 2 (two) times daily. Pt was instructed to take Tylenol to avoid gastritis   General Counseling: Emaley verbalizes understanding of the findings of todays visit and agrees with plan of treatment. I have discussed any further diagnostic evaluation that may be needed or ordered today. We also reviewed her medications today. she has been encouraged to call the office with any questions or concerns that should arise related to todays visit.   Orders Placed This Encounter  Procedures  . DG UGI W/O KUB  . DG Esophagus  . Ambulatory referral to Dermatology  . Ambulatory referral to Obstetrics / Gynecology  . Ambulatory referral to Gastroenterology  . POCT HgB A1C    Meds ordered this encounter  Medications  . ranitidine (RANITIDINE 150 MAX STRENGTH) 150 MG tablet    Sig: Take 1 tablet (150 mg total) by mouth 2 (two) times daily.    Dispense:  60 tablet    Refill:  12    Time spent:25  Minutes   Dr Lavera Guise Internal medicine

## 2018-02-24 ENCOUNTER — Other Ambulatory Visit: Payer: Self-pay | Admitting: Internal Medicine

## 2018-03-04 ENCOUNTER — Other Ambulatory Visit: Payer: Self-pay | Admitting: Internal Medicine

## 2018-03-04 ENCOUNTER — Ambulatory Visit
Admission: RE | Admit: 2018-03-04 | Discharge: 2018-03-04 | Disposition: A | Discharge status: Home / Self Care | Payer: Medicare Other | Source: Ambulatory Visit | Attending: Internal Medicine | Admitting: Internal Medicine

## 2018-03-04 ENCOUNTER — Other Ambulatory Visit: Payer: Self-pay

## 2018-03-04 DIAGNOSIS — K219 Gastro-esophageal reflux disease without esophagitis: Secondary | ICD-10-CM

## 2018-03-04 DIAGNOSIS — R1319 Other dysphagia: Secondary | ICD-10-CM | POA: Insufficient documentation

## 2018-03-04 DIAGNOSIS — K210 Gastro-esophageal reflux disease with esophagitis: Secondary | ICD-10-CM | POA: Insufficient documentation

## 2018-03-04 MED ORDER — CETIRIZINE HCL 10 MG PO CAPS: ORAL_CAPSULE | ORAL | 5 refills | Status: DC

## 2018-03-04 NOTE — Progress Notes (Signed)
GI

## 2018-03-05 NOTE — Progress Notes (Signed)
Pt was notified and has a GI appt set up.

## 2018-03-06 ENCOUNTER — Other Ambulatory Visit: Payer: Self-pay

## 2018-03-06 MED ORDER — TRAZODONE HCL 100 MG PO TABS: 100.0000 mg | ORAL_TABLET | Freq: Every day | ORAL | 2 refills | Status: DC

## 2018-03-10 DIAGNOSIS — N949 Unspecified condition associated with female genital organs and menstrual cycle: Secondary | ICD-10-CM | POA: Diagnosis not present

## 2018-03-10 DIAGNOSIS — R3989 Other symptoms and signs involving the genitourinary system: Secondary | ICD-10-CM | POA: Diagnosis not present

## 2018-03-19 DIAGNOSIS — L578 Other skin changes due to chronic exposure to nonionizing radiation: Secondary | ICD-10-CM | POA: Diagnosis not present

## 2018-03-19 DIAGNOSIS — L821 Other seborrheic keratosis: Secondary | ICD-10-CM | POA: Diagnosis not present

## 2018-03-19 DIAGNOSIS — L812 Freckles: Secondary | ICD-10-CM | POA: Diagnosis not present

## 2018-03-19 DIAGNOSIS — L820 Inflamed seborrheic keratosis: Secondary | ICD-10-CM | POA: Diagnosis not present

## 2018-03-19 DIAGNOSIS — B078 Other viral warts: Secondary | ICD-10-CM | POA: Diagnosis not present

## 2018-03-31 ENCOUNTER — Other Ambulatory Visit: Payer: Self-pay | Admitting: Nurse Practitioner

## 2018-03-31 MED ORDER — ESTROGENS CONJUGATED 1.25 MG PO TABS: 1.2500 mg | ORAL_TABLET | Freq: Every day | ORAL | 3 refills | Status: DC

## 2018-04-01 DIAGNOSIS — N3281 Overactive bladder: Secondary | ICD-10-CM | POA: Diagnosis not present

## 2018-04-01 DIAGNOSIS — N949 Unspecified condition associated with female genital organs and menstrual cycle: Secondary | ICD-10-CM | POA: Diagnosis not present

## 2018-04-06 ENCOUNTER — Encounter: Payer: Self-pay | Admitting: Gastroenterology

## 2018-04-06 ENCOUNTER — Encounter (INDEPENDENT_AMBULATORY_CARE_PROVIDER_SITE_OTHER): Payer: Self-pay

## 2018-04-06 ENCOUNTER — Other Ambulatory Visit: Payer: Self-pay

## 2018-04-06 ENCOUNTER — Ambulatory Visit (INDEPENDENT_AMBULATORY_CARE_PROVIDER_SITE_OTHER): Payer: Medicare Other | Admitting: Gastroenterology

## 2018-04-06 VITALS — BP 124/70 | HR 52 | Ht 67.0 in | Wt 134.0 lb

## 2018-04-06 DIAGNOSIS — R634 Abnormal weight loss: Secondary | ICD-10-CM | POA: Diagnosis not present

## 2018-04-06 DIAGNOSIS — K219 Gastro-esophageal reflux disease without esophagitis: Secondary | ICD-10-CM | POA: Diagnosis not present

## 2018-04-06 DIAGNOSIS — R1033 Periumbilical pain: Secondary | ICD-10-CM | POA: Diagnosis not present

## 2018-04-06 DIAGNOSIS — R194 Change in bowel habit: Secondary | ICD-10-CM

## 2018-04-06 NOTE — Progress Notes (Signed)
Gastroenterology Consultation  Referring Provider:     Lavera Guise, MD Primary Care Physician:  Lavera Guise, MD Primary Gastroenterologist:  Dr. Allen Norris     Reason for Consultation:     Abdominal pain and GERD        HPI:   Misty Robbins is a 47 y.o. y/o female referred for consultation & management of abdominal pain and GERD by Dr. Humphrey Rolls, Timoteo Gaul, MD.  This patient comes in today with a report of abdominal pain.  The patient states that her weight has also gone down by approximate 30 pounds of the last year.  She does have a history of having C. difficile colitis and reports that she has not had normal bowel movement since then.  She also had an upper GI series that did not show any strictures or narrowing but did show severe reflux.  The patient has been on Dexilant and also supplements it with a daily Zantac.  Despite taking the Dexilant and Zantac she still has lower abdominal pain and states that she also has food getting stuck in her esophagus that has to be chased down with more food and she also has burning in her throat and reflux symptoms in the morning and after she eats.  The patient states that she props her bed up at night so she is not laying flat.  The patient reports that her mother has a history of polyps and there is multiple different types of cancers throughout her family.  The patient denies any black stools or bloody stools.  Past Medical History:  Diagnosis Date  . Allergy   . Anxiety   . Arthritis   . Bipolar 1 disorder ([place])   . Depression   . Hyperlipidemia   . Hypertension   . Insomnia   . Osteoporosis   . Peripheral neuropathy     Past Surgical History:  Procedure Laterality Date  . ABDOMINAL HYSTERECTOMY  2000  . CERVICAL FUSION  [id number]  . SKIN GRAFT  1981  . TONSILLECTOMY  2013    Prior to Admission medications   Medication Sig Start Date End Date Taking? Authorizing Provider  Calcium Carbonate-Vitamin D (CALCIUM HIGH POTENCY/VITAMIN D)  600-200 MG-UNIT TABS Take by mouth.   Yes [provider]  Cetirizine HCl 10 MG CAPS Take 1 tablet (10MG ) by mouth daily 03/04/18  Yes Boscia, Heather E, NP  dexlansoprazole (DEXILANT) 60 MG capsule Take 1 capsule (60 mg total) by mouth daily. 11/06/17  Yes Boscia, Greer Ee, NP  estrogens, conjugated, (PREMARIN) 1.25 MG tablet Take 1 tablet (1.25 mg total) by mouth daily. 03/31/18  Yes Boscia, Greer Ee, NP  etodolac (LODINE) 500 MG tablet Take 500 mg by mouth 2 (two) times daily.   Yes [provider]  metoprolol tartrate (LOPRESSOR) 50 MG tablet TAKE 1 TABLET(50 MG) BY MOUTH TWICE DAILY 01/08/18  Yes Lavera Guise, MD  niacin 250 MG tablet Take 250 mg by mouth at bedtime.   Yes [provider]  pravastatin (PRAVACHOL) 40 MG tablet Take 1 tablet (40 mg total) by mouth daily. 01/30/18  Yes Lavera Guise, MD  ranitidine (RANITIDINE 150 MAX STRENGTH) 150 MG tablet Take 1 tablet (150 mg total) by mouth 2 (two) times daily. 02/17/18  Yes Lavera Guise, MD  traZODone (DESYREL) 100 MG tablet Take 1 tablet (100 mg total) by mouth at bedtime. 03/06/18  Yes Boscia, Greer Ee, NP  ARIPiprazole (ABILIFY) 20 MG tablet Take  by mouth.    [provider]  busPIRone (BUSPAR) 7.5 MG tablet Take by mouth. 08/13/14   [provider]  calcium carbonate (CALCIUM 600) 600 MG TABS tablet Take 800 mg by mouth 2 (two) times daily with a meal.    [provider]  diazepam (VALIUM) 2 MG tablet Take by mouth.    [provider]  hydrochlorothiazide (MICROZIDE) 12.5 MG capsule Take by mouth.    [provider]  HYDROmorphone (DILAUDID) 2 MG tablet Take by mouth.    [provider]  HYDROmorphone HCl (EXALGO) 12 MG T24A SR tablet Take by mouth.    [provider]  ibuprofen (ADVIL,MOTRIN) 800 MG tablet Take by mouth.    [provider]  metoprolol tartrate (LOPRESSOR) 50 MG tablet Take 1 tablet (50 mg total) by mouth 2 (two) times daily.  11/06/17   Ronnell Freshwater, NP  metoprolol tartrate (LOPRESSOR) 50 MG tablet TAKE 1 TABLET(50 MG) BY MOUTH TWICE DAILY 02/24/18   Lavera Guise, MD  ondansetron [facility]) 4 MG tablet Take by mouth. 12/19/15   [provider]  oxyCODONE (OXY IR/ROXICODONE) 5 MG immediate release tablet Take by mouth. 12/12/14   [provider]  oxyCODONE 30 MG 12 hr tablet Take by mouth.    [provider]  pantoprazole (PROTONIX) 40 MG tablet  05/20/14   [provider]  tizanidine (ZANAFLEX) 2 MG capsule Take 2 mg by mouth 3 (three) times daily.    [provider]    Family History  Problem Relation Age of Onset  . Breast cancer Paternal Aunt        56's  . Heart disease Mother   . Hypertension Mother   . Stroke Mother   . Stroke Father   . Cancer Father   . Heart disease Father   . Diabetes Sister   . Diabetes Brother      Social History   Tobacco Use  . Smoking status: Current Every Day Smoker    Years: 25.00    Types: Cigarettes  . Smokeless tobacco: Never Used  Substance Use Topics  . Alcohol use: No  . Drug use: No    Allergies as of 04/06/2018 - Review Complete 04/06/2018  Allergen Reaction Noted  . Caffeine  07/06/2014  . Hydrocodone Nausea And Vomiting 07/06/2014  . Pseudoephedrine Other (See Comments) 07/06/2014    Review of Systems:    All systems reviewed and negative except where noted in HPI.   Physical Exam:  BP 124/70   Pulse (!) 52   Ht 5\' 7"  (1.702 m)   Wt 134 lb (60.8 kg)   BMI 20.99 kg/m  No LMP recorded. Patient has had a hysterectomy. Psych:  Alert and cooperative. Normal mood and affect. General:   Alert,  Well-developed, well-nourished, pleasant and cooperative in NAD Head:  Normocephalic and atraumatic. Eyes:  Sclera clear, no icterus.   Conjunctiva pink. Ears:  Normal auditory acuity. Nose:  No deformity, discharge, or lesions. Mouth:  No deformity or lesions,oropharynx pink & moist. Neck:  Supple; no masses  or thyromegaly. Lungs:  Respirations even and unlabored.  Clear throughout to auscultation.   No wheezes, crackles, or rhonchi. No acute distress. Heart:  Regular rate and rhythm; no murmurs, clicks, rubs, or gallops. Abdomen:  Normal bowel sounds.  No bruits.  Soft, mild tenderness in the periumbilical area and non-distended without masses, hepatosplenomegaly or hernias noted.  No guarding or rebound tenderness.  Negative Carnett sign.  Rectal:  Deferred.  Msk:  Symmetrical without gross deformities.  Good, equal movement & strength bilaterally. Pulses:  Normal pulses noted. Extremities:  No clubbing or edema.  No cyanosis. Neurologic:  Alert and oriented x3;  grossly normal neurologically. Skin:  Intact without significant lesions or rashes.  No jaundice. Lymph Nodes:  No significant cervical adenopathy. Psych:  Alert and cooperative. Normal mood and affect.  Imaging Studies: No results found.  Assessment and Plan:   CHANTEE CERINO is a 47 y.o. y/o female who comes in today with abdominal pain in the lower abdomen with a history of reflux and dysphasia.  The patient has been having diarrhea for over a year.  She also reports that she has had some weight loss of 30 pounds in the last year.  The patient will be set up for an EGD and colonoscopy to look for source of the dysphasia and her diarrhea with weight loss.  The patient has also been told that if everything is negative she may need to undergo pH studies to see if she is actually having reflux as a cause of her symptoms.  The patient has been explained the plan and agrees with it.  Lucilla Lame, MD. Marval Regal   Note: This dictation was prepared with Dragon dictation along with smaller phrase technology. Any transcriptional errors that result from this process are unintentional.

## 2018-04-08 ENCOUNTER — Other Ambulatory Visit: Payer: Self-pay

## 2018-04-08 DIAGNOSIS — R194 Change in bowel habit: Secondary | ICD-10-CM

## 2018-04-08 DIAGNOSIS — R634 Abnormal weight loss: Secondary | ICD-10-CM

## 2018-04-08 DIAGNOSIS — K219 Gastro-esophageal reflux disease without esophagitis: Secondary | ICD-10-CM

## 2018-04-10 NOTE — Discharge Instructions (Signed)
General Anesthesia, Adult, Care After °These instructions provide you with information about caring for yourself after your procedure. Your health care provider may also give you more specific instructions. Your treatment has been planned according to current medical practices, but problems sometimes occur. Call your health care provider if you have any problems or questions after your procedure. °What can I expect after the procedure? °After the procedure, it is common to have: °· Vomiting. °· A sore throat. °· Mental slowness. ° °It is common to feel: °· Nauseous. °· Cold or shivery. °· Sleepy. °· Tired. °· Sore or achy, even in parts of your body where you did not have surgery. ° °Follow these instructions at home: °For at least 24 hours after the procedure: °· Do not: °? Participate in activities where you could fall or become injured. °? Drive. °? Use heavy machinery. °? Drink alcohol. °? Take sleeping pills or medicines that cause drowsiness. °? Make important decisions or sign legal documents. °? Take care of children on your own. °· Rest. °Eating and drinking °· If you vomit, drink water, juice, or soup when you can drink without vomiting. °· Drink enough fluid to keep your urine clear or pale yellow. °· Make sure you have little or no nausea before eating solid foods. °· Follow the diet recommended by your health care provider. °General instructions °· Have a responsible adult stay with you until you are awake and alert. °· Return to your normal activities as told by your health care provider. Ask your health care provider what activities are safe for you. °· Take over-the-counter and prescription medicines only as told by your health care provider. °· If you smoke, do not smoke without supervision. °· Keep all follow-up visits as told by your health care provider. This is important. °Contact a health care provider if: °· You continue to have nausea or vomiting at home, and medicines are not helpful. °· You  cannot drink fluids or start eating again. °· You cannot urinate after 8-12 hours. °· You develop a skin rash. °· You have fever. °· You have increasing redness at the site of your procedure. °Get help right away if: °· You have difficulty breathing. °· You have chest pain. °· You have unexpected bleeding. °· You feel that you are having a life-threatening or urgent problem. °This information is not intended to replace advice given to you by your health care provider. Make sure you discuss any questions you have with your health care provider. °Document Released: 01/20/2001 Document Revised: 03/18/2016 Document Reviewed: 09/28/2015 °Elsevier Interactive Patient Education © 2018 [employer] ° °

## 2018-04-13 ENCOUNTER — Ambulatory Visit
Admission: RE | Admit: 2018-04-13 | Discharge: 2018-04-13 | Disposition: A | Discharge status: Home / Self Care | Payer: Medicare Other | Source: Ambulatory Visit | Attending: Gastroenterology | Admitting: Gastroenterology

## 2018-04-13 ENCOUNTER — Ambulatory Visit: Payer: Medicare Other | Admitting: Anesthesiology

## 2018-04-13 ENCOUNTER — Encounter
Admission: RE | Disposition: A | Discharge status: Home / Self Care | Payer: Self-pay | Source: Ambulatory Visit | Attending: Gastroenterology

## 2018-04-13 DIAGNOSIS — G629 Polyneuropathy, unspecified: Secondary | ICD-10-CM | POA: Insufficient documentation

## 2018-04-13 DIAGNOSIS — R634 Abnormal weight loss: Secondary | ICD-10-CM

## 2018-04-13 DIAGNOSIS — R197 Diarrhea, unspecified: Secondary | ICD-10-CM | POA: Diagnosis not present

## 2018-04-13 DIAGNOSIS — D120 Benign neoplasm of cecum: Secondary | ICD-10-CM

## 2018-04-13 DIAGNOSIS — K2950 Unspecified chronic gastritis without bleeding: Secondary | ICD-10-CM

## 2018-04-13 DIAGNOSIS — I081 Rheumatic disorders of both mitral and tricuspid valves: Secondary | ICD-10-CM | POA: Insufficient documentation

## 2018-04-13 DIAGNOSIS — G4700 Insomnia, unspecified: Secondary | ICD-10-CM | POA: Diagnosis not present

## 2018-04-13 DIAGNOSIS — K2930 Chronic superficial gastritis without bleeding: Secondary | ICD-10-CM | POA: Diagnosis not present

## 2018-04-13 DIAGNOSIS — K219 Gastro-esophageal reflux disease without esophagitis: Secondary | ICD-10-CM | POA: Diagnosis not present

## 2018-04-13 DIAGNOSIS — E785 Hyperlipidemia, unspecified: Secondary | ICD-10-CM | POA: Insufficient documentation

## 2018-04-13 DIAGNOSIS — K529 Noninfective gastroenteritis and colitis, unspecified: Secondary | ICD-10-CM | POA: Insufficient documentation

## 2018-04-13 DIAGNOSIS — M810 Age-related osteoporosis without current pathological fracture: Secondary | ICD-10-CM | POA: Diagnosis not present

## 2018-04-13 DIAGNOSIS — Z8249 Family history of ischemic heart disease and other diseases of the circulatory system: Secondary | ICD-10-CM | POA: Insufficient documentation

## 2018-04-13 DIAGNOSIS — F17210 Nicotine dependence, cigarettes, uncomplicated: Secondary | ICD-10-CM | POA: Insufficient documentation

## 2018-04-13 DIAGNOSIS — K2970 Gastritis, unspecified, without bleeding: Secondary | ICD-10-CM | POA: Diagnosis not present

## 2018-04-13 DIAGNOSIS — K210 Gastro-esophageal reflux disease with esophagitis: Secondary | ICD-10-CM | POA: Diagnosis not present

## 2018-04-13 DIAGNOSIS — M1990 Unspecified osteoarthritis, unspecified site: Secondary | ICD-10-CM | POA: Diagnosis not present

## 2018-04-13 DIAGNOSIS — F419 Anxiety disorder, unspecified: Secondary | ICD-10-CM | POA: Diagnosis not present

## 2018-04-13 DIAGNOSIS — I10 Essential (primary) hypertension: Secondary | ICD-10-CM | POA: Insufficient documentation

## 2018-04-13 DIAGNOSIS — Z981 Arthrodesis status: Secondary | ICD-10-CM | POA: Diagnosis not present

## 2018-04-13 DIAGNOSIS — J45909 Unspecified asthma, uncomplicated: Secondary | ICD-10-CM | POA: Diagnosis not present

## 2018-04-13 DIAGNOSIS — Z79899 Other long term (current) drug therapy: Secondary | ICD-10-CM | POA: Diagnosis not present

## 2018-04-13 DIAGNOSIS — F319 Bipolar disorder, unspecified: Secondary | ICD-10-CM | POA: Insufficient documentation

## 2018-04-13 HISTORY — PX: ESOPHAGOGASTRODUODENOSCOPY (EGD) WITH PROPOFOL: SHX5813

## 2018-04-13 HISTORY — PX: POLYPECTOMY: SHX5525

## 2018-04-13 HISTORY — DX: Unspecified asthma, uncomplicated: J45.909

## 2018-04-13 HISTORY — DX: Rheumatic tricuspid insufficiency: I07.1

## 2018-04-13 HISTORY — DX: Nonrheumatic mitral (valve) insufficiency: I34.0

## 2018-04-13 HISTORY — PX: COLONOSCOPY WITH PROPOFOL: SHX5780

## 2018-04-13 HISTORY — DX: Gastro-esophageal reflux disease without esophagitis: K21.9

## 2018-04-13 HISTORY — DX: Chest pain, unspecified: R07.9

## 2018-04-13 SURGERY — COLONOSCOPY WITH PROPOFOL
Anesthesia: General | Site: Throat | Wound class: Contaminated

## 2018-04-13 MED ORDER — LACTATED RINGERS IV SOLN
10.0000 mL/h | INTRAVENOUS | Status: DC
  Administered 2018-04-13: 09:00:00 via INTRAVENOUS
  Administered 2018-04-13: 10 mL/h via INTRAVENOUS
  Administered 2018-04-13: 08:00:00 via INTRAVENOUS

## 2018-04-13 MED ORDER — LIDOCAINE HCL (CARDIAC) PF 100 MG/5ML IV SOSY
PREFILLED_SYRINGE | INTRAVENOUS | Status: DC | PRN
  Administered 2018-04-13: 100 mg via INTRAVENOUS

## 2018-04-13 MED ORDER — ONDANSETRON HCL 4 MG/2ML IJ SOLN: 4.0000 mg | Freq: Once | INTRAMUSCULAR | Status: DC | PRN

## 2018-04-13 MED ORDER — STERILE WATER FOR IRRIGATION IR SOLN
Status: DC | PRN
  Administered 2018-04-13: .5 mL

## 2018-04-13 MED ORDER — GLYCOPYRROLATE 0.2 MG/ML IJ SOLN
INTRAMUSCULAR | Status: DC | PRN
  Administered 2018-04-13: 0.2 mg via INTRAVENOUS

## 2018-04-13 MED ORDER — PROPOFOL 10 MG/ML IV BOLUS
INTRAVENOUS | Status: DC | PRN
  Administered 2018-04-13: 40 mg via INTRAVENOUS
  Administered 2018-04-13: 80 mg via INTRAVENOUS
  Administered 2018-04-13: 40 mg via INTRAVENOUS
  Administered 2018-04-13: 30 mg via INTRAVENOUS
  Administered 2018-04-13 (×2): 20 mg via INTRAVENOUS
  Administered 2018-04-13: 40 mg via INTRAVENOUS
  Administered 2018-04-13: 30 mg via INTRAVENOUS

## 2018-04-13 SURGICAL SUPPLY — 9 items
BLOCK BITE 60FR ADLT L/F GRN (MISCELLANEOUS) ×5 IMPLANT
CANISTER SUCT 1200ML W/VALVE (MISCELLANEOUS) ×5 IMPLANT
FORCEPS BIOP RAD 4 LRG CAP 4 (CUTTING FORCEPS) ×5 IMPLANT
GOWN CVR UNV OPN BCK APRN NK (MISCELLANEOUS) ×6 IMPLANT
GOWN ISOL THUMB LOOP REG UNIV (MISCELLANEOUS) ×4
KIT ENDO PROCEDURE OLY (KITS) ×5 IMPLANT
SNARE SHORT THROW 13M SML OVAL (MISCELLANEOUS) ×5 IMPLANT
TRAP ETRAP POLY (MISCELLANEOUS) ×5 IMPLANT
WATER STERILE IRR 250ML POUR (IV SOLUTION) ×5 IMPLANT

## 2018-04-13 NOTE — Anesthesia Preprocedure Evaluation (Signed)
Anesthesia Evaluation  Patient identified by MRN, date of birth, ID band Patient awake    Reviewed: Allergy & Precautions, NPO status , Patient's Chart, lab work & pertinent test results  History of Anesthesia Complications Negative for: history of anesthetic complications  Airway Mallampati: II  TM Distance: >3 FB Neck ROM: Full    Dental  (+)    Pulmonary asthma , Current Smoker (1/2 ppd),    Pulmonary exam normal breath sounds clear to auscultation       Cardiovascular hypertension, Normal cardiovascular exam Rhythm:Regular Rate:Normal     Neuro/Psych PSYCHIATRIC DISORDERS Anxiety Depression Bipolar Disorder  Neuromuscular disease (peripheral neuropathy)    GI/Hepatic GERD  ,  Endo/Other  negative endocrine ROS  Renal/GU negative Renal ROS     Musculoskeletal  (+) Arthritis , Osteoarthritis,    Abdominal   Peds  Hematology negative hematology ROS (+)   Anesthesia Other Findings   Reproductive/Obstetrics                             Anesthesia Physical Anesthesia Plan  ASA: II  Anesthesia Plan: General   Post-op Pain Management:    Induction: Intravenous  PONV Risk Score and Plan: 2 and Propofol infusion and TIVA  Airway Management Planned: Natural Airway  Additional Equipment:   Intra-op Plan:   Post-operative Plan:   Informed Consent: I have reviewed the patients History and Physical, chart, labs and discussed the procedure including the risks, benefits and alternatives for the proposed anesthesia with the patient or authorized representative who has indicated his/her understanding and acceptance.     Plan Discussed with: CRNA  Anesthesia Plan Comments:         Anesthesia Quick Evaluation

## 2018-04-13 NOTE — Anesthesia Postprocedure Evaluation (Signed)
Anesthesia Post Note  Patient: Misty Robbins  Procedure(s) Performed: COLONOSCOPY WITH PROPOFOL (N/A Rectum) ESOPHAGOGASTRODUODENOSCOPY (EGD) WITH PROPOFOL (N/A Throat) POLYPECTOMY (Rectum)  Patient location during evaluation: PACU Anesthesia Type: General Level of consciousness: awake and alert, oriented and patient cooperative Pain management: pain level controlled Vital Signs Assessment: post-procedure vital signs reviewed and stable Respiratory status: spontaneous breathing, nonlabored ventilation and respiratory function stable Cardiovascular status: blood pressure returned to baseline and stable Postop Assessment: adequate PO intake Anesthetic complications: no    Darrin Nipper

## 2018-04-13 NOTE — Anesthesia Procedure Notes (Signed)
Procedure Name: MAC Date/Time: 04/13/2018 8:03 AM Performed by: Lind Guest, CRNA Pre-anesthesia Checklist: Patient identified, Emergency Drugs available, Suction available, Patient being monitored and Timeout performed Patient Re-evaluated:Patient Re-evaluated prior to induction Oxygen Delivery Method: Nasal cannula

## 2018-04-13 NOTE — Op Note (Signed)
[facility] Gastroenterology Patient Name: Misty Robbins Procedure Date: 04/13/2018 7:37 AM MRN: [id number] Account #: [id number] Date of Birth: [birth year] Admit Type: Outpatient Age: 47 Room: [facility] OR ROOM 01 Gender: Female Note Status: Finalized Procedure:            Upper GI endoscopy Indications:          Epigastric abdominal pain, Weight loss Providers:            Lucilla Lame MD, MD Referring MD:         Lavera Guise, MD (Referring MD) Medicines:            Propofol per Anesthesia Complications:        No immediate complications. Procedure:            Pre-Anesthesia Assessment:                       - Prior to the procedure, a History and Physical was                        performed, and patient medications and allergies were                        reviewed. The patient's tolerance of previous                        anesthesia was also reviewed. The risks and benefits of                        the procedure and the sedation options and risks were                        discussed with the patient. All questions were                        answered, and informed consent was obtained. Prior                        Anticoagulants: The patient has taken no previous                        anticoagulant or antiplatelet agents. ASA Grade                        Assessment: II - A patient with mild systemic disease.                        After reviewing the risks and benefits, the patient was                        deemed in satisfactory condition to undergo the                        procedure.                       After obtaining informed consent, the endoscope was                        passed under direct vision. Throughout the procedure,  the patient's blood pressure, pulse, and oxygen                        saturations were monitored continuously. The was                        introduced through the mouth, and advanced to the             second part of duodenum. The upper GI endoscopy was                        accomplished without difficulty. The patient tolerated                        the procedure well. Findings:      The examined esophagus was normal.      Localized mild inflammation characterized by erythema was found in the       gastric antrum. Biopsies were taken with a cold forceps for histology.      The examined duodenum was normal. Impression:           - Normal esophagus.                       - Gastritis. Biopsied.                       - Normal examined duodenum. Recommendation:       - Discharge patient to home.                       - Resume previous diet.                       - Continue present medications.                       - Await pathology results. Procedure Code(s):    --- Professional ---                       [phone], Esophagogastroduodenoscopy, flexible, transoral;                        with biopsy, single or multiple Diagnosis Code(s):    --- Professional ---                       R63.4, Abnormal weight loss                       R10.13, Epigastric pain                       K29.70, Gastritis, unspecified, without bleeding CPT copyright 2017 American Medical Association. All rights reserved. The codes documented in this report are preliminary and upon coder review may  be revised to meet current compliance requirements. Lucilla Lame MD, MD 04/13/2018 8:17:27 AM This report has been signed electronically. Number of Addenda: 0 Note Initiated On: 04/13/2018 7:37 AM      [facility]

## 2018-04-13 NOTE — Op Note (Signed)
[facility] Gastroenterology Patient Name: Misty Robbins Procedure Date: 04/13/2018 7:54 AM MRN: [id number] Account #: [id number] Date of Birth: 06-09-1971 Admit Type: Outpatient Age: 47 Room: [facility] OR ROOM 01 Gender: Female Note Status: Finalized Procedure:            Colonoscopy Indications:          Chronic diarrhea, Weight loss Providers:            Lucilla Lame MD, MD Medicines:            Propofol per Anesthesia Complications:        No immediate complications. Procedure:            Pre-Anesthesia Assessment:                       - Prior to the procedure, a History and Physical was                        performed, and patient medications and allergies were                        reviewed. The patient's tolerance of previous                        anesthesia was also reviewed. The risks and benefits of                        the procedure and the sedation options and risks were                        discussed with the patient. All questions were                        answered, and informed consent was obtained. Prior                        Anticoagulants: The patient has taken no previous                        anticoagulant or antiplatelet agents. ASA Grade                        Assessment: II - A patient with mild systemic disease.                        After reviewing the risks and benefits, the patient was                        deemed in satisfactory condition to undergo the                        procedure.                       After obtaining informed consent, the colonoscope was                        passed under direct vision. Throughout the procedure,                        the patient's blood pressure, pulse,  and oxygen                        saturations were monitored continuously. The was                        introduced through the anus and advanced to the the                        terminal ileum. The colonoscopy was performed without                       difficulty. The patient tolerated the procedure well.                        The quality of the bowel preparation was fair. Findings:      The perianal and digital rectal examinations were normal.      A 7 mm polyp was found in the cecum. The polyp was sessile. The polyp       was removed with a cold snare. Resection and retrieval were complete.      The terminal ileum appeared normal. Biopsies were taken with a cold       forceps for histology.      Biopsies were taken with a cold forceps in the terminal ileum for       histology. Impression:           - Preparation of the colon was fair.                       - One 7 mm polyp in the cecum, removed with a cold                        snare. Resected and retrieved.                       - The examined portion of the ileum was normal.                        Biopsied.                       - Biopsies were taken with a cold forceps for histology                        in the terminal ileum. Recommendation:       - Discharge patient to home.                       - Resume previous diet.                       - Continue present medications.                       - Await pathology results.                       - Repeat colonoscopy in 5 years if polyp adenoma and 10                        years if hyperplastic Procedure Code(s):    ---  Professional ---                       [id number], Colonoscopy, flexible; with removal of tumor(s),                        polyp(s), or other lesion(s) by snare technique                       [id number], 59, Colonoscopy, flexible; with biopsy, single                        or multiple Diagnosis Code(s):    --- Professional ---                       K52.9, Noninfective gastroenteritis and colitis,                        unspecified                       R63.4, Abnormal weight loss                       D12.0, Benign neoplasm of cecum CPT copyright 2017 American Medical Association. All rights  reserved. The codes documented in this report are preliminary and upon coder review may  be revised to meet current compliance requirements. Lucilla Lame MD, MD 04/13/2018 8:34:48 AM This report has been signed electronically. Number of Addenda: 0 Note Initiated On: 04/13/2018 7:54 AM Scope Withdrawal Time: 0 hours 7 minutes 23 seconds  Total Procedure Duration: 0 hours 9 minutes 26 seconds       [facility]

## 2018-04-13 NOTE — H&P (Signed)
Misty Lame, MD [address] [place],  [address] Phone:[phone] Fax : [phone]  Primary Care Physician:  Lavera Guise, MD Primary Gastroenterologist:  Dr. Allen Norris  Pre-Procedure History & Physical: HPI:  Misty Robbins is a 47 y.o. female is here for an endoscopy and colonoscopy.   Past Medical History:  Diagnosis Date  . Allergy   . Anxiety   . Arthritis   . Asthma   . Bipolar 1 disorder ([place])   . Chest pain   . Depression   . GERD (gastroesophageal reflux disease)   . Hyperlipidemia   . Hypertension   . Insomnia   . Mitral valve regurgitation   . Osteoporosis   . Peripheral neuropathy   . Tricuspid valve regurgitation     Past Surgical History:  Procedure Laterality Date  . ABDOMINAL HYSTERECTOMY  2000  . CERVICAL FUSION  [id number]   c2-7  . SKIN GRAFT  1981  . TONSILLECTOMY  2013    Prior to Admission medications   Medication Sig Start Date End Date Taking? Authorizing Provider  albuterol (PROVENTIL HFA;VENTOLIN HFA) 108 (90 Base) MCG/ACT inhaler Inhale into the lungs every 6 (six) hours as needed for wheezing or shortness of breath.   Yes [provider]  Calcium Carbonate-Vitamin D (CALCIUM HIGH POTENCY/VITAMIN D) 600-200 MG-UNIT TABS Take by mouth.   Yes [provider]  Cetirizine HCl 10 MG CAPS Take 1 tablet (10MG ) by mouth daily 03/04/18  Yes Boscia, Heather E, NP  dexlansoprazole (DEXILANT) 60 MG capsule Take 1 capsule (60 mg total) by mouth daily. 11/06/17  Yes Boscia, Greer Ee, NP  estrogens, conjugated, (PREMARIN) 1.25 MG tablet Take 1 tablet (1.25 mg total) by mouth daily. 03/31/18  Yes Boscia, Greer Ee, NP  etodolac (LODINE) 500 MG tablet Take 500 mg by mouth 2 (two) times daily.   Yes [provider]  metoprolol tartrate (LOPRESSOR) 50 MG tablet TAKE 1 TABLET(50 MG) BY MOUTH TWICE DAILY 01/08/18  Yes Lavera Guise, MD  metoprolol tartrate (LOPRESSOR) 50 MG tablet TAKE 1 TABLET(50 MG) BY MOUTH TWICE  DAILY 02/24/18  Yes Lavera Guise, MD  niacin 250 MG tablet Take 250 mg by mouth at bedtime.   Yes [provider]  oxybutynin (DITROPAN-XL) 10 MG 24 hr tablet Take 10 mg by mouth at bedtime.   Yes [provider]  pravastatin (PRAVACHOL) 40 MG tablet Take 1 tablet (40 mg total) by mouth daily. 01/30/18  Yes Lavera Guise, MD  ranitidine (RANITIDINE 150 MAX STRENGTH) 150 MG tablet Take 1 tablet (150 mg total) by mouth 2 (two) times daily. 02/17/18  Yes Lavera Guise, MD  traZODone (DESYREL) 100 MG tablet Take 1 tablet (100 mg total) by mouth at bedtime. 03/06/18  Yes Boscia, Greer Ee, NP  HYDROmorphone (DILAUDID) 2 MG tablet Take by mouth.    [provider]  HYDROmorphone HCl (EXALGO) 12 MG T24A SR tablet Take by mouth.    [provider]  ibuprofen (ADVIL,MOTRIN) 800 MG tablet Take by mouth.    [provider]  oxyCODONE (OXY IR/ROXICODONE) 5 MG immediate release tablet Take by mouth. 12/12/14   [provider]  oxyCODONE 30 MG 12 hr tablet Take by mouth.    [provider]    Allergies as of 04/08/2018 - Review Complete 04/06/2018  Allergen Reaction Noted  . Caffeine  07/06/2014  . Hydrocodone Nausea And Vomiting 07/06/2014  . Pseudoephedrine Other (See Comments) 07/06/2014  Family History  Problem Relation Age of Onset  . Breast cancer Paternal Aunt        86's  . Heart disease Mother   . Hypertension Mother   . Stroke Mother   . Stroke Father   . Cancer Father   . Heart disease Father   . Diabetes Sister   . Diabetes Brother     Social History   Socioeconomic History  . Marital status: Married    Spouse name: Not on file  . Number of children: Not on file  . Years of education: Not on file  . Highest education level: Not on file  Occupational History  . Not on file  Social Needs  . Financial resource strain: Not on file  . Food insecurity:    Worry: Not on file    Inability: Not on file  . Transportation  needs:    Medical: Not on file    Non-medical: Not on file  Tobacco Use  . Smoking status: Current Every Day Smoker    Packs/day: 0.50    Years: 25.00    Pack years: 12.50    Types: Cigarettes  . Smokeless tobacco: Never Used  Substance and Sexual Activity  . Alcohol use: No  . Drug use: No  . Sexual activity: Not on file  Lifestyle  . Physical activity:    Days per week: Not on file    Minutes per session: Not on file  . Stress: Not on file  Relationships  . Social connections:    Talks on phone: Not on file    Gets together: Not on file    Attends religious service: Not on file    Active member of club or organization: Not on file    Attends meetings of clubs or organizations: Not on file    Relationship status: Not on file  . Intimate partner violence:    Fear of current or ex partner: Not on file    Emotionally abused: Not on file    Physically abused: Not on file    Forced sexual activity: Not on file  Other Topics Concern  . Not on file  Social History Narrative  . Not on file    Review of Systems: See HPI, otherwise negative ROS  Physical Exam: BP 121/80   Pulse (!) 52   Temp 97.9 F (36.6 C) (Temporal)   Wt 133 lb (60.3 kg)   SpO2 100%   BMI 20.83 kg/m  General:   Alert,  pleasant and cooperative in NAD Head:  Normocephalic and atraumatic. Neck:  Supple; no masses or thyromegaly. Lungs:  Clear throughout to auscultation.    Heart:  Regular rate and rhythm. Abdomen:  Soft, nontender and nondistended. Normal bowel sounds, without guarding, and without rebound.   Neurologic:  Alert and  oriented x4;  grossly normal neurologically.  Impression/Plan: [name] is here for an endoscopy and colonoscopy to be performed for dysphagia and diarrhea with weight loss.  Risks, benefits, limitations, and alternatives regarding  endoscopy and colonoscopy have been reviewed with the patient.  Questions have been answered.  All parties agreeable.   Misty Lame, MD  04/13/2018, 7:50 AM

## 2018-04-13 NOTE — Transfer of Care (Signed)
Immediate Anesthesia Transfer of Care Note  Patient: Misty Robbins  Procedure(s) Performed: COLONOSCOPY WITH PROPOFOL (N/A Rectum) ESOPHAGOGASTRODUODENOSCOPY (EGD) WITH PROPOFOL (N/A Throat) POLYPECTOMY (Rectum)  Patient Location: PACU  Anesthesia Type: General  Level of Consciousness: awake, alert  and patient cooperative  Airway and Oxygen Therapy: Patient Spontanous Breathing and Patient connected to supplemental oxygen  Post-op Assessment: Post-op Vital signs reviewed, Patient's Cardiovascular Status Stable, Respiratory Function Stable, Patent Airway and No signs of Nausea or vomiting  Post-op Vital Signs: Reviewed and stable  Complications: No apparent anesthesia complications

## 2018-04-14 ENCOUNTER — Encounter: Payer: Self-pay | Admitting: Gastroenterology

## 2018-04-15 ENCOUNTER — Encounter: Payer: Self-pay | Admitting: Gastroenterology

## 2018-04-20 ENCOUNTER — Encounter: Payer: Self-pay | Admitting: Gastroenterology

## 2018-04-20 DIAGNOSIS — L821 Other seborrheic keratosis: Secondary | ICD-10-CM | POA: Diagnosis not present

## 2018-04-20 DIAGNOSIS — B078 Other viral warts: Secondary | ICD-10-CM | POA: Diagnosis not present

## 2018-04-20 DIAGNOSIS — Z872 Personal history of diseases of the skin and subcutaneous tissue: Secondary | ICD-10-CM | POA: Diagnosis not present

## 2018-04-23 DIAGNOSIS — N3281 Overactive bladder: Secondary | ICD-10-CM | POA: Diagnosis not present

## 2018-05-06 DIAGNOSIS — H5203 Hypermetropia, bilateral: Secondary | ICD-10-CM | POA: Diagnosis not present

## 2018-05-07 DIAGNOSIS — H5213 Myopia, bilateral: Secondary | ICD-10-CM | POA: Diagnosis not present

## 2018-05-13 NOTE — H&P (Signed)
Also will perform bilateral salpingectomy for cancer prophylaxis

## 2018-05-13 NOTE — H&P (Signed)
Misty Robbins a 47 y.o.femalehere for Urinary Complaints (stress incontinence, and dyspareunia .consult from Dr Cruzita Lederer for urinary incontinence. . She states that she has had 3 yr h/o of urinary leakage with cough sneeze . Sometimes cant control her leakage . Has tried Kegel exercises now more recently  ,  She also c/o significant urgency and has to get to the bathroom right away 66months . Was tried on anti spasm before when she had just SUI sx . 6 months of new onset dyspareunia . She is s/p aRSO as a separate procedure  And LSH + LSO and appendectomy 2008( indication pelvic pain and dyspareunia ) , both at [facility] .Post op pelvic pain improved , but dyspareunia did not. She take premarin 1.25 mg .   Since being on Oxybutynin 10 mg OAB symptoms have imroved dramatically . Less SUI as well . She is performing Kegel exercises  Which is helping . Long standing deep thrust dyspareunia   Past Medical History:  has a past medical history of Depression, Dermoid cyst (1999, 2008), GERD (gastroesophageal reflux disease), Heart disease, Hyperlipidemia, and Hypertension.  Past Surgical History:  has a past surgical history that includes Tonsillectomy; Appendectomy; Tubal ligation; Hysterectomy (2008); and neck surgery (2010, 2011). Family History: family history includes Heart disease in her mother; Lung cancer in her father; Stroke in her father. Social History:  reports that she has been smoking cigarettes.  She has been smoking about 0.30 packs per day. She has never used smokeless tobacco. She reports that she drinks alcohol. She reports that she does not use drugs. OB/GYN History:          OB History    Gravida  3   Para  3   Term  2   Preterm  1   AB      Living  2     SAB      TAB      Ectopic      Molar      Multiple      Live Births  1          Allergies: is allergic to caffeine; hydrocodone; and pseudoephedrine. Medications:  Current Outpatient Medications:   .  calcium carbonate-vitamin D3 (CALTRATE 600+D) 600 mg(1,500mg ) -200 unit tablet, Take 1 tablet by mouth 2 (two) times daily with meals., Disp: , Rfl:  .  cetirizine (ZYRTEC) 10 MG tablet, , Disp: , Rfl: 5 .  DEXILANT 60 mg DR capsule, , Disp: , Rfl:  .  etodolac (LODINE) 500 MG tablet, , Disp: , Rfl:  .  metoprolol succinate (TOPROL-XL) 50 MG XL tablet, , Disp: , Rfl:  .  niacin 250 MG tablet, Take by mouth, Disp: , Rfl:  .  oxybutynin (DITROPAN-XL) 10 MG XL tablet, Take 1 tablet (10 mg total) by mouth once daily, Disp: 30 tablet, Rfl: 11 .  pravastatin (PRAVACHOL) 40 MG tablet, Take 40 mg by mouth nightly, Disp: , Rfl:  .  PREMARIN 1.25 mg tablet, , Disp: , Rfl: 3 .  ranitidine (ZANTAC) 150 MG tablet, , Disp: , Rfl:  .  traZODone (DESYREL) 50 MG tablet, , Disp: , Rfl: 0 .  ARIPiprazole (ABILIFY) 20 MG tablet, Take 20 mg by mouth once daily., Disp: , Rfl:   Review of Systems: General:                      No fatigue or weight loss Eyes:  No vision changes Ears:                            No hearing difficulty Respiratory:                No cough or shortness of breath Pulmonary:                  No asthma or shortness of breath Cardiovascular:           No chest pain, palpitations, dyspnea on exertion Gastrointestinal:          No abdominal bloating, chronic diarrhea, constipations, masses, pain or hematochezia Genitourinary:             No hematuria, dysuria, abnormal vaginal discharge, pelvic pain, Menometrorrhagia, + dyspareunia  Lymphatic:                   No swollen lymph nodes Musculoskeletal:         No muscle weakness Neurologic:                  No extremity weakness, syncope, seizure disorder Psychiatric:                  No history of depression, delusions or suicidal/homicidal ideation    Exam:      Vitals:   05/14/2018  0957  BP: 143/83  Pulse: 56    Body mass index is 20.74 kg/m.  WDWN white/  female in NAD   Lungs: CTA  CV  : RRR without murmur    Neck:  no thyromegaly Abdomen: soft , no mass, normal active bowel sounds,  non-tender, no rebound tenderness Pelvic: tanner stage 5 ,  External genitalia: vulva /labia no lesions Urethra: no prolapse Vagina: normal physiologic d/c adequate room for cervical trachelectomy  Cervix: no lesions, no cervical motion tenderness   Uterus:absent Adnexa: no mass,  non-tender   Rectovaginal:   Impression:   The primary encounter diagnosis was Dyspareunia, female. Diagnoses of Stress incontinence in female and OAB (overactive bladder) were also pertinent to this visit.  oab + SUI improved with meds and kegel exercises .  Dyspareunia has been long standing . Possible cervical etiology . Possible additional adhesions   Plan:   Offered DX L/S and possible LOA and cervical trachelectomy  She is aware that her pain may not improve post op      Misty Rembert Samuel [place], MD

## 2018-05-13 NOTE — H&P (Deleted)
[name] is a 47 y.o. 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 Follow up colpo 6/19 was negative   cxbx shows CIN1 recently  Pt also c/o falling tissue  For the last several months . + sexually active No difficulty with urination and no splinting with BM     Past Medical History:  has a past medical history of Abnormal cytology, Arthritis, Hypercholesterolemia, Hypertension, Kidney stones, and Seasonal allergies.  Past Surgical History:  has a past surgical history that includes Right eye surgery (Right); Tubal ligation; Right total knee arthroplasty (11/22/2004); Cervical biopsy w/ loop electrode excision (12/23/14); and Left total knee arthroplasty using computer-generated navigation (11/22/2015). Family History: family history includes Allergies (age of onset: 45) in her father; Glaucoma in her father; High blood pressure (Hypertension) in her brother, father, mother, and sister; Osteoarthritis in her mother; Prostate cancer in her father; Skin cancer in her father. Social History:  reports that she has never smoked. She has never used smokeless tobacco. She reports that she does not drink alcohol or use drugs. OB/GYN History:          OB History    Gravida  2   Para  2   Term  2   Preterm      AB      Living  2     SAB      TAB      Ectopic      Molar      Multiple      Live Births  2          Allergies: is allergic to ace inhibitors; cholestyramine; simvastatin; and statins-hmg-coa reductase inhibitors. Medications:  Current Outpatient Medications:  .  amLODIPine (NORVASC) 10 MG tablet, Take 1 tablet (10 mg total) by mouth once daily., Disp: 30 tablet, Rfl: 11 .  APPLE CIDER VINEGAR ORAL, Take 480 mg by mouth, Disp: , Rfl:  .  aspirin 81 MG EC tablet, Take 81 mg by mouth daily., Disp: , Rfl:  .  cholecalciferol (VITAMIN D3) 2,000 unit  capsule, Take 2,000 Units by mouth daily., Disp: , Rfl:  .  DOCOSAHEXANOIC ACID/EPA (FISH OIL ORAL), Take 1,000 mg by mouth., Disp: , Rfl:  .  krill-om3-dha-epa-om6-lip-astx (KRILL OIL, OMEGA 3 AND 6,) 1000-130(40-80) mg Cap, Take by mouth, Disp: , Rfl:  .  losartan-hydrochlorothiazide (HYZAAR) 100-25 mg tablet, Take 1 tablet by mouth once daily, Disp: 90 tablet, Rfl: 3 .  lysine 500 mg Tab, Take by mouth., Disp: , Rfl:  .  magnesium 250 mg Tab, Take by mouth., Disp: , Rfl:   Review of Systems: General:                      No fatigue or weight loss Eyes:                           No vision changes Ears:                            No hearing difficulty Respiratory:                No cough or shortness of breath Pulmonary:  No asthma or shortness of breath Cardiovascular:           No chest pain, palpitations, dyspnea on exertion Gastrointestinal:          No abdominal bloating, chronic diarrhea, constipations, masses, pain or hematochezia Genitourinary:             No hematuria, dysuria, abnormal vaginal discharge, pelvic pain, Menometrorrhagia Lymphatic:                   No swollen lymph nodes Musculoskeletal:         No muscle weakness Neurologic:                  No extremity weakness, syncope, seizure disorder Psychiatric:                  No history of depression, delusions or suicidal/homicidal ideation    Exam:      Vitals:   03/04/18 1022  BP: (!) 130/95  Pulse: 92    Body mass index is 33.64 kg/m.  WDWN white/  female in NAD   Lungs: CTA  CV : RRR without murmur   Neck:  no thyromegaly Abdomen: soft , no mass, normal active bowel sounds,  non-tender, no rebound tenderness Pelvic: tanner stage 5 ,  External genitalia: vulva /labia no lesions Urethra: no prolapse Vagina: 3 rd degree cystocele . No rectocele , no uterine descensus Cervix: no lesions, no cervical motion tenderness   Uterus: normal size shape and contour, non-tender Adnexa: no  mass,  non-tender   Rectovaginal:   Impression:   The primary encounter diagnosis CIN1 and a  diagnosis of Cystocele, midline was also pertinent to this visit.  Symptomatic , 3 rd degree cystocele   Plan:   Spoke to her about the pathophysiology of POP . Treatment options include pessary and surgical intervention ie .Marland Kitchen TVH and anterior repair . She is aware of the potential for recurrence with heavy lifting . After full discussion she elects to proceed with surgery .   shen is aware of the possibility of dyspareunia and urinary incontinence with the proposed surgery    Misty Sauger, MD

## 2018-05-18 ENCOUNTER — Other Ambulatory Visit: Payer: Self-pay

## 2018-05-18 ENCOUNTER — Encounter
Admission: RE | Admit: 2018-05-18 | Discharge: 2018-05-18 | Disposition: A | Discharge status: Home / Self Care | Payer: Medicare Other | Source: Ambulatory Visit | Attending: Obstetrics and Gynecology | Admitting: Obstetrics and Gynecology

## 2018-05-18 DIAGNOSIS — Z01810 Encounter for preprocedural cardiovascular examination: Secondary | ICD-10-CM | POA: Diagnosis not present

## 2018-05-18 DIAGNOSIS — R9431 Abnormal electrocardiogram [ECG] [EKG]: Secondary | ICD-10-CM | POA: Insufficient documentation

## 2018-05-18 DIAGNOSIS — Z01812 Encounter for preprocedural laboratory examination: Secondary | ICD-10-CM | POA: Insufficient documentation

## 2018-05-18 DIAGNOSIS — I10 Essential (primary) hypertension: Secondary | ICD-10-CM | POA: Diagnosis not present

## 2018-05-18 HISTORY — DX: Personal history of urinary calculi: Z87.442

## 2018-05-18 LAB — BASIC METABOLIC PANEL
ANION GAP: 6 (ref 5–15)
BUN: 10 mg/dL (ref 6–20)
CO2: 29 mmol/L (ref 22–32)
Calcium: 8.7 mg/dL — ABNORMAL LOW (ref 8.9–10.3)
Chloride: 107 mmol/L (ref 98–111)
Creatinine, Ser: 0.77 mg/dL (ref 0.44–1.00)
GLUCOSE: 79 mg/dL (ref 70–99)
POTASSIUM: 4.2 mmol/L (ref 3.5–5.1)
Sodium: 142 mmol/L (ref 135–145)

## 2018-05-18 LAB — CBC
HEMATOCRIT: 39.5 % (ref 35.0–47.0)
HEMOGLOBIN: 13.4 g/dL (ref 12.0–16.0)
MCH: 31.1 pg (ref 26.0–34.0)
MCHC: 33.9 g/dL (ref 32.0–36.0)
MCV: 91.6 fL (ref 80.0–100.0)
Platelets: 177 10*3/uL (ref 150–440)
RBC: 4.31 MIL/uL (ref 3.80–5.20)
RDW: 13.4 % (ref 11.5–14.5)
WBC: 7.6 10*3/uL (ref 3.6–11.0)

## 2018-05-18 LAB — TYPE AND SCREEN
ABO/RH(D): B POS
Antibody Screen: NEGATIVE

## 2018-05-18 NOTE — Patient Instructions (Signed)
Your procedure is scheduled on: Monday, May 25, 2018 Report to Day Surgery on the 2nd floor of the [facility]. To find out your arrival time, please call [phone] between 1PM - 3PM on: Friday, May 22, 2018  REMEMBER: Instructions that are not followed completely may result in serious medical risk, up to and including death; or upon the discretion of your surgeon and anesthesiologist your surgery may need to be rescheduled.  Do not eat food after midnight the night before your procedure.  No gum chewing, lozengers or hard candies.  You may however, drink CLEAR liquids up to 2 hours before you are scheduled to arrive for your surgery. Do not drink anything within 2 hours of the start of your surgery.  Clear liquids include: - water  - apple juice without pulp - clear gatorade - black coffee or tea (Do NOT add anything to the coffee or tea) Do NOT drink anything that is not on this list.  No Alcohol for 24 hours before or after surgery.  No Smoking including e-cigarettes for 24 hours prior to surgery.  No chewable tobacco products for at least 6 hours prior to surgery.  No nicotine patches on the day of surgery.  On the morning of surgery brush your teeth with toothpaste and water, you may rinse your mouth with mouthwash if you wish. Do not swallow any toothpaste or mouthwash.  Notify your doctor if there is any change in your medical condition (cold, fever, infection).  Do not wear jewelry, make-up, hairpins, clips or nail polish.  Do not wear lotions, powders, or perfumes. You may wear deodorant.  Do not shave 48 hours prior to surgery.   Contacts and dentures may not be worn into surgery.  Do not bring valuables to the hospital, including drivers license, insurance or credit cards.  [facility] is not responsible for any belongings or valuables.   TAKE THESE MEDICATIONS THE MORNING OF SURGERY:  1.  Albuterol inhaler 2.  Metoprolol 3.  ranitidine  Use CHG Soap  as directed on instruction sheet.  Use inhalers on the day of surgery and bring to the hospital.  NOW!  Stop Anti-inflammatories (NSAIDS) such as (LODINE), Advil, Aleve, Ibuprofen, Motrin, Naproxen, Naprosyn and Aspirin based products such as Excedrin, Goodys Powder, BC Powder. (May take Tylenol or Acetaminophen if needed.)  NOW!  Stop ANY OVER THE COUNTER supplements until after surgery.  Wear comfortable clothing (specific to your surgery type) to the hospital.  Plan for stool softeners for home use.  If you are being discharged the day of surgery, you will not be allowed to drive home. You will need a responsible adult to drive you home and stay with you that night.   If you are taking public transportation, you will need to have a responsible adult with you. Please confirm with your physician that it is acceptable to use public transportation.   Please call [phone] if you have any questions about these instructions.

## 2018-05-18 NOTE — Pre-Procedure Instructions (Signed)
Incentive spirometer given with written and verbal instructions; acknowledged understanding.

## 2018-05-25 ENCOUNTER — Encounter: Payer: Self-pay | Admitting: *Deleted

## 2018-05-25 ENCOUNTER — Ambulatory Visit: Payer: Medicare Other | Admitting: Certified Registered Nurse Anesthetist

## 2018-05-25 ENCOUNTER — Encounter
Admission: AD | Disposition: A | Discharge status: Home / Self Care | Payer: Self-pay | Source: Ambulatory Visit | Attending: Obstetrics and Gynecology

## 2018-05-25 ENCOUNTER — Other Ambulatory Visit: Payer: Self-pay

## 2018-05-25 ENCOUNTER — Observation Stay
Admission: AD | Admit: 2018-05-25 | Discharge: 2018-05-26 | Disposition: A | Discharge status: Home / Self Care | Payer: Medicare Other | Source: Ambulatory Visit | Attending: Obstetrics and Gynecology | Admitting: Obstetrics and Gynecology

## 2018-05-25 DIAGNOSIS — Z9079 Acquired absence of other genital organ(s): Secondary | ICD-10-CM | POA: Diagnosis not present

## 2018-05-25 DIAGNOSIS — F17210 Nicotine dependence, cigarettes, uncomplicated: Secondary | ICD-10-CM | POA: Insufficient documentation

## 2018-05-25 DIAGNOSIS — N879 Dysplasia of cervix uteri, unspecified: Secondary | ICD-10-CM | POA: Diagnosis not present

## 2018-05-25 DIAGNOSIS — E785 Hyperlipidemia, unspecified: Secondary | ICD-10-CM | POA: Diagnosis not present

## 2018-05-25 DIAGNOSIS — Z7982 Long term (current) use of aspirin: Secondary | ICD-10-CM | POA: Insufficient documentation

## 2018-05-25 DIAGNOSIS — G8929 Other chronic pain: Secondary | ICD-10-CM | POA: Diagnosis not present

## 2018-05-25 DIAGNOSIS — J45909 Unspecified asthma, uncomplicated: Secondary | ICD-10-CM | POA: Insufficient documentation

## 2018-05-25 DIAGNOSIS — N888 Other specified noninflammatory disorders of cervix uteri: Secondary | ICD-10-CM | POA: Insufficient documentation

## 2018-05-25 DIAGNOSIS — F329 Major depressive disorder, single episode, unspecified: Secondary | ICD-10-CM | POA: Insufficient documentation

## 2018-05-25 DIAGNOSIS — N3281 Overactive bladder: Secondary | ICD-10-CM | POA: Insufficient documentation

## 2018-05-25 DIAGNOSIS — Z90710 Acquired absence of both cervix and uterus: Secondary | ICD-10-CM | POA: Diagnosis not present

## 2018-05-25 DIAGNOSIS — N941 Unspecified dyspareunia: Secondary | ICD-10-CM | POA: Diagnosis not present

## 2018-05-25 DIAGNOSIS — I10 Essential (primary) hypertension: Secondary | ICD-10-CM | POA: Insufficient documentation

## 2018-05-25 DIAGNOSIS — Z90722 Acquired absence of ovaries, bilateral: Secondary | ICD-10-CM | POA: Diagnosis not present

## 2018-05-25 DIAGNOSIS — N736 Female pelvic peritoneal adhesions (postinfective): Secondary | ICD-10-CM | POA: Insufficient documentation

## 2018-05-25 DIAGNOSIS — N393 Stress incontinence (female) (male): Secondary | ICD-10-CM | POA: Insufficient documentation

## 2018-05-25 DIAGNOSIS — K219 Gastro-esophageal reflux disease without esophagitis: Secondary | ICD-10-CM | POA: Insufficient documentation

## 2018-05-25 DIAGNOSIS — Z96653 Presence of artificial knee joint, bilateral: Secondary | ICD-10-CM | POA: Diagnosis not present

## 2018-05-25 DIAGNOSIS — N803 Endometriosis of pelvic peritoneum: Secondary | ICD-10-CM | POA: Insufficient documentation

## 2018-05-25 DIAGNOSIS — Z9889 Other specified postprocedural states: Secondary | ICD-10-CM

## 2018-05-25 DIAGNOSIS — F419 Anxiety disorder, unspecified: Secondary | ICD-10-CM | POA: Diagnosis not present

## 2018-05-25 DIAGNOSIS — N9489 Other specified conditions associated with female genital organs and menstrual cycle: Secondary | ICD-10-CM | POA: Diagnosis not present

## 2018-05-25 DIAGNOSIS — R102 Pelvic and perineal pain: Secondary | ICD-10-CM | POA: Diagnosis not present

## 2018-05-25 DIAGNOSIS — Z79899 Other long term (current) drug therapy: Secondary | ICD-10-CM | POA: Insufficient documentation

## 2018-05-25 HISTORY — PX: LAPAROSCOPY: SHX197

## 2018-05-25 HISTORY — PX: TRACHELECTOMY: SHX6586

## 2018-05-25 HISTORY — PX: LYSIS OF ADHESION: SHX5961

## 2018-05-25 LAB — ABO/RH: ABO/RH(D): B POS

## 2018-05-25 SURGERY — LAPAROSCOPY, DIAGNOSTIC
Anesthesia: General

## 2018-05-25 MED ORDER — ONDANSETRON HCL 4 MG/2ML IJ SOLN: 4.0000 mg | Freq: Four times a day (QID) | INTRAMUSCULAR | Status: DC | PRN

## 2018-05-25 MED ORDER — CEFAZOLIN SODIUM-DEXTROSE 2-4 GM/100ML-% IV SOLN
2.0000 g | Freq: Once | INTRAVENOUS | Status: AC
  Administered 2018-05-25: 2 g via INTRAVENOUS

## 2018-05-25 MED ORDER — PROPOFOL 10 MG/ML IV BOLUS
INTRAVENOUS | Status: DC | PRN
  Administered 2018-05-25: 150 mg via INTRAVENOUS

## 2018-05-25 MED ORDER — LACTATED RINGERS IV SOLN: INTRAVENOUS | Status: DC

## 2018-05-25 MED ORDER — DEXAMETHASONE SODIUM PHOSPHATE 10 MG/ML IJ SOLN
INTRAMUSCULAR | Status: DC | PRN
  Administered 2018-05-25: 10 mg via INTRAVENOUS

## 2018-05-25 MED ORDER — FENTANYL CITRATE (PF) 100 MCG/2ML IJ SOLN
INTRAMUSCULAR | Status: AC
  Filled 2018-05-25: qty 2

## 2018-05-25 MED ORDER — FENTANYL CITRATE (PF) 100 MCG/2ML IJ SOLN
INTRAMUSCULAR | Status: AC
  Administered 2018-05-25: 50 ug via INTRAVENOUS
  Filled 2018-05-25: qty 2

## 2018-05-25 MED ORDER — PRAVASTATIN SODIUM 40 MG PO TABS
40.0000 mg | ORAL_TABLET | Freq: Once | ORAL | Status: AC
  Administered 2018-05-25: 40 mg via ORAL
  Filled 2018-05-25: qty 1

## 2018-05-25 MED ORDER — ONDANSETRON HCL 4 MG PO TABS: 4.0000 mg | ORAL_TABLET | Freq: Four times a day (QID) | ORAL | Status: DC | PRN

## 2018-05-25 MED ORDER — MIDAZOLAM HCL 2 MG/2ML IJ SOLN
INTRAMUSCULAR | Status: AC
  Filled 2018-05-25: qty 2

## 2018-05-25 MED ORDER — LIDOCAINE-EPINEPHRINE (PF) 1 %-1:200000 IJ SOLN
INTRAMUSCULAR | Status: AC
  Filled 2018-05-25: qty 30

## 2018-05-25 MED ORDER — ACETAMINOPHEN NICU IV SYRINGE 10 MG/ML
INTRAVENOUS | Status: AC
  Filled 2018-05-25: qty 1

## 2018-05-25 MED ORDER — KETOROLAC TROMETHAMINE 30 MG/ML IJ SOLN
INTRAMUSCULAR | Status: DC | PRN
  Administered 2018-05-25: 30 mg via INTRAVENOUS

## 2018-05-25 MED ORDER — METOPROLOL TARTRATE 50 MG PO TABS
50.0000 mg | ORAL_TABLET | Freq: Once | ORAL | Status: AC
  Administered 2018-05-25: 50 mg via ORAL
  Filled 2018-05-25: qty 1

## 2018-05-25 MED ORDER — GLYCOPYRROLATE 0.2 MG/ML IJ SOLN
INTRAMUSCULAR | Status: DC | PRN
  Administered 2018-05-25: 0.2 mg via INTRAVENOUS

## 2018-05-25 MED ORDER — ROCURONIUM BROMIDE 100 MG/10ML IV SOLN
INTRAVENOUS | Status: AC
  Filled 2018-05-25: qty 1

## 2018-05-25 MED ORDER — VASOPRESSIN 20 UNIT/ML IV SOLN
INTRAVENOUS | Status: AC
  Filled 2018-05-25: qty 1

## 2018-05-25 MED ORDER — SUGAMMADEX SODIUM 200 MG/2ML IV SOLN
INTRAVENOUS | Status: DC | PRN
  Administered 2018-05-25: 150 mg via INTRAVENOUS

## 2018-05-25 MED ORDER — MIDAZOLAM HCL 2 MG/2ML IJ SOLN
INTRAMUSCULAR | Status: DC | PRN
  Administered 2018-05-25: 2 mg via INTRAVENOUS

## 2018-05-25 MED ORDER — ONDANSETRON HCL 4 MG/2ML IJ SOLN
INTRAMUSCULAR | Status: DC | PRN
  Administered 2018-05-25: 4 mg via INTRAVENOUS

## 2018-05-25 MED ORDER — PROMETHAZINE HCL 25 MG/ML IJ SOLN: 6.2500 mg | INTRAMUSCULAR | Status: DC | PRN

## 2018-05-25 MED ORDER — BUPIVACAINE HCL (PF) 0.5 % IJ SOLN
INTRAMUSCULAR | Status: AC
  Filled 2018-05-25: qty 30

## 2018-05-25 MED ORDER — MORPHINE SULFATE (PF) 2 MG/ML IV SOLN
1.0000 mg | INTRAVENOUS | Status: DC | PRN
  Administered 2018-05-25 – 2018-05-26 (×5): 2 mg via INTRAVENOUS
  Filled 2018-05-25 (×5): qty 1

## 2018-05-25 MED ORDER — LACTATED RINGERS IV SOLN
INTRAVENOUS | Status: DC | PRN
  Administered 2018-05-25 (×2): via INTRAVENOUS

## 2018-05-25 MED ORDER — FAMOTIDINE 20 MG PO TABS
20.0000 mg | ORAL_TABLET | Freq: Once | ORAL | Status: AC
  Administered 2018-05-25: 20 mg via ORAL
  Filled 2018-05-25: qty 1

## 2018-05-25 MED ORDER — OXYCODONE-ACETAMINOPHEN 5-325 MG PO TABS
ORAL_TABLET | ORAL | Status: AC
  Filled 2018-05-25: qty 2

## 2018-05-25 MED ORDER — LIDOCAINE HCL (PF) 2 % IJ SOLN
INTRAMUSCULAR | Status: AC
  Filled 2018-05-25: qty 10

## 2018-05-25 MED ORDER — FENTANYL CITRATE (PF) 100 MCG/2ML IJ SOLN
25.0000 ug | INTRAMUSCULAR | Status: DC | PRN
  Administered 2018-05-25 (×2): 50 ug via INTRAVENOUS

## 2018-05-25 MED ORDER — CALCIUM CARBONATE ANTACID 500 MG PO CHEW: 2.0000 | CHEWABLE_TABLET | Freq: Four times a day (QID) | ORAL | Status: DC | PRN

## 2018-05-25 MED ORDER — PROPOFOL 10 MG/ML IV BOLUS
INTRAVENOUS | Status: AC
  Filled 2018-05-25: qty 20

## 2018-05-25 MED ORDER — MEPERIDINE HCL 50 MG/ML IJ SOLN: 6.2500 mg | INTRAMUSCULAR | Status: DC | PRN

## 2018-05-25 MED ORDER — OXYCODONE-ACETAMINOPHEN 5-325 MG PO TABS
2.0000 | ORAL_TABLET | ORAL | Status: DC | PRN
  Administered 2018-05-25 – 2018-05-26 (×4): 2 via ORAL
  Filled 2018-05-25 (×4): qty 2

## 2018-05-25 MED ORDER — SODIUM CHLORIDE 0.9 % IJ SOLN
INTRAMUSCULAR | Status: AC
  Filled 2018-05-25: qty 50

## 2018-05-25 MED ORDER — LIDOCAINE HCL (CARDIAC) PF 100 MG/5ML IV SOSY
PREFILLED_SYRINGE | INTRAVENOUS | Status: DC | PRN
  Administered 2018-05-25: 80 mg via INTRAVENOUS

## 2018-05-25 MED ORDER — METHYLENE BLUE 0.5 % INJ SOLN
INTRAVENOUS | Status: DC | PRN
  Administered 2018-05-25: 5 mL via SUBMUCOSAL

## 2018-05-25 MED ORDER — DEXAMETHASONE SODIUM PHOSPHATE 10 MG/ML IJ SOLN
INTRAMUSCULAR | Status: AC
  Filled 2018-05-25: qty 1

## 2018-05-25 MED ORDER — ROCURONIUM BROMIDE 100 MG/10ML IV SOLN
INTRAVENOUS | Status: DC | PRN
  Administered 2018-05-25: 45 mg via INTRAVENOUS
  Administered 2018-05-25: 5 mg via INTRAVENOUS

## 2018-05-25 MED ORDER — ESTROGENS, CONJUGATED 0.625 MG/GM VA CREA
TOPICAL_CREAM | VAGINAL | Status: AC
  Filled 2018-05-25: qty 30

## 2018-05-25 MED ORDER — ESTROGENS CONJUGATED 1.25 MG PO TABS
1.2500 mg | ORAL_TABLET | Freq: Once | ORAL | Status: AC
  Administered 2018-05-25: 1.25 mg via ORAL
  Filled 2018-05-25: qty 1

## 2018-05-25 MED ORDER — EPHEDRINE SULFATE 50 MG/ML IJ SOLN
INTRAMUSCULAR | Status: DC | PRN
  Administered 2018-05-25 (×3): 5 mg via INTRAVENOUS

## 2018-05-25 MED ORDER — ACETAMINOPHEN 10 MG/ML IV SOLN
INTRAVENOUS | Status: DC | PRN
  Administered 2018-05-25: 1000 mg via INTRAVENOUS

## 2018-05-25 MED ORDER — METHYLENE BLUE 0.5 % INJ SOLN
INTRAVENOUS | Status: AC
  Filled 2018-05-25: qty 10

## 2018-05-25 MED ORDER — CEFAZOLIN SODIUM-DEXTROSE 2-4 GM/100ML-% IV SOLN
INTRAVENOUS | Status: AC
  Filled 2018-05-25: qty 100

## 2018-05-25 MED ORDER — FENTANYL CITRATE (PF) 100 MCG/2ML IJ SOLN
INTRAMUSCULAR | Status: DC | PRN
  Administered 2018-05-25 (×2): 50 ug via INTRAVENOUS

## 2018-05-25 MED ORDER — LIDOCAINE HCL (PF) 1 % IJ SOLN
INTRAMUSCULAR | Status: AC
  Filled 2018-05-25: qty 30

## 2018-05-25 MED ORDER — SUGAMMADEX SODIUM 200 MG/2ML IV SOLN
INTRAVENOUS | Status: AC
  Filled 2018-05-25: qty 2

## 2018-05-25 MED ORDER — LORATADINE 10 MG PO TABS
10.0000 mg | ORAL_TABLET | Freq: Once | ORAL | Status: AC
  Administered 2018-05-25: 10 mg via ORAL
  Filled 2018-05-25: qty 1

## 2018-05-25 MED ORDER — PNEUMOCOCCAL VAC POLYVALENT 25 MCG/0.5ML IJ INJ
0.5000 mL | INJECTION | INTRAMUSCULAR | Status: DC
  Filled 2018-05-25: qty 0.5

## 2018-05-25 MED ORDER — SUCCINYLCHOLINE CHLORIDE 20 MG/ML IJ SOLN
INTRAMUSCULAR | Status: AC
  Filled 2018-05-25: qty 1

## 2018-05-25 MED ORDER — ONDANSETRON HCL 4 MG/2ML IJ SOLN
INTRAMUSCULAR | Status: AC
  Filled 2018-05-25: qty 2

## 2018-05-25 SURGICAL SUPPLY — 59 items
BAG URINE DRAINAGE (UROLOGICAL SUPPLIES) ×2 IMPLANT
BLADE SURG SZ10 CARB STEEL (BLADE) ×2 IMPLANT
BLADE SURG SZ11 CARB STEEL (BLADE) ×2 IMPLANT
BNDG GAUZE 4.5X4.1 6PLY STRL (MISCELLANEOUS) ×2 IMPLANT
CANISTER SUCT 1200ML W/VALVE (MISCELLANEOUS) ×2 IMPLANT
CATH FOLEY 2WAY  5CC 16FR (CATHETERS) ×1
CATH ROBINSON RED A/P 16FR (CATHETERS) ×2 IMPLANT
CATH URTH 16FR FL 2W BLN LF (CATHETERS) ×1 IMPLANT
CHLORAPREP W/TINT 26ML (MISCELLANEOUS) ×2 IMPLANT
DERMABOND ADVANCED (GAUZE/BANDAGES/DRESSINGS) ×1
DERMABOND ADVANCED .7 DNX12 (GAUZE/BANDAGES/DRESSINGS) ×1 IMPLANT
DRAPE PERI LITHO V/GYN (MISCELLANEOUS) ×2 IMPLANT
DRAPE SHEET LG 3/4 BI-LAMINATE (DRAPES) ×2 IMPLANT
DRAPE SURG 17X11 SM STRL (DRAPES) ×2 IMPLANT
DRAPE UNDER BUTTOCK W/FLU (DRAPES) ×2 IMPLANT
DRSG TEGADERM 2-3/8X2-3/4 SM (GAUZE/BANDAGES/DRESSINGS) ×6 IMPLANT
ELECT REM PT RETURN 9FT ADLT (ELECTROSURGICAL) ×2
ELECTRODE REM PT RTRN 9FT ADLT (ELECTROSURGICAL) ×1 IMPLANT
GLOVE BIO SURGEON STRL SZ8 (GLOVE) ×2 IMPLANT
GOWN STRL REUS W/ TWL LRG LVL3 (GOWN DISPOSABLE) ×3 IMPLANT
GOWN STRL REUS W/ TWL XL LVL3 (GOWN DISPOSABLE) ×1 IMPLANT
GOWN STRL REUS W/TWL LRG LVL3 (GOWN DISPOSABLE) ×3
GOWN STRL REUS W/TWL XL LVL3 (GOWN DISPOSABLE) ×1
GRASPER SUT TROCAR 14GX15 (MISCELLANEOUS) ×4 IMPLANT
IRRIGATION STRYKERFLOW (MISCELLANEOUS) ×1 IMPLANT
IRRIGATOR STRYKERFLOW (MISCELLANEOUS) ×2
IV NS 1000ML (IV SOLUTION) ×1
IV NS 1000ML BAXH (IV SOLUTION) ×1 IMPLANT
KIT PINK PAD W/HEAD ARE REST (MISCELLANEOUS) ×2
KIT PINK PAD W/HEAD ARM REST (MISCELLANEOUS) ×1 IMPLANT
KIT TURNOVER CYSTO (KITS) ×2 IMPLANT
LABEL OR SOLS (LABEL) ×2 IMPLANT
NDL SAFETY ECLIPSE 18X1.5 (NEEDLE) ×1 IMPLANT
NEEDLE HYPO 18GX1.5 SHARP (NEEDLE) ×1
NEEDLE HYPO 22GX1.5 SAFETY (NEEDLE) ×2 IMPLANT
NS IRRIG 500ML POUR BTL (IV SOLUTION) ×2 IMPLANT
PACK BASIN MINOR ARMC (MISCELLANEOUS) ×2 IMPLANT
PACK GYN LAPAROSCOPIC (MISCELLANEOUS) ×2 IMPLANT
PAD OB MATERNITY 4.3X12.25 (PERSONAL CARE ITEMS) ×2 IMPLANT
PAD PREP 24X41 OB/GYN DISP (PERSONAL CARE ITEMS) ×2 IMPLANT
SCISSORS METZENBAUM CVD 33 (INSTRUMENTS) ×2 IMPLANT
SHEARS HARMONIC ACE PLUS 36CM (ENDOMECHANICALS) IMPLANT
SLEEVE ENDOPATH XCEL 5M (ENDOMECHANICALS) ×2 IMPLANT
SPONGE GAUZE 2X2 8PLY STRL LF (GAUZE/BANDAGES/DRESSINGS) ×4 IMPLANT
SPONGE XRAY 4X4 16PLY STRL (MISCELLANEOUS) ×2 IMPLANT
STRIP CLOSURE SKIN 1/2X4 (GAUZE/BANDAGES/DRESSINGS) ×2 IMPLANT
SUT VIC AB 0 CT1 27 (SUTURE) ×2
SUT VIC AB 0 CT1 27XCR 8 STRN (SUTURE) ×2 IMPLANT
SUT VIC AB 0 CT1 36 (SUTURE) ×2 IMPLANT
SUT VIC AB 2-0 UR6 27 (SUTURE) ×2 IMPLANT
SUT VIC AB 4-0 SH 27 (SUTURE) ×2
SUT VIC AB 4-0 SH 27XANBCTRL (SUTURE) ×2 IMPLANT
SWABSTK COMLB BENZOIN TINCTURE (MISCELLANEOUS) ×2 IMPLANT
SYR 10ML LL (SYRINGE) ×2 IMPLANT
SYR 30ML LL (SYRINGE) ×2 IMPLANT
SYR CONTROL 10ML (SYRINGE) ×2 IMPLANT
TROCAR ENDO BLADELESS 11MM (ENDOMECHANICALS) ×2 IMPLANT
TROCAR XCEL NON-BLD 5MMX100MML (ENDOMECHANICALS) ×2 IMPLANT
TUBING INSUFFLATION (TUBING) ×2 IMPLANT

## 2018-05-25 NOTE — Progress Notes (Signed)
Ready for dx laparoscopy and cervical trachelectomy  loabs reviewed . NPO . Proceed

## 2018-05-25 NOTE — Anesthesia Preprocedure Evaluation (Signed)
Anesthesia Evaluation  Patient identified by MRN, date of birth, ID band Patient awake    Reviewed: Allergy & Precautions, NPO status , Patient's Chart, lab work & pertinent test results  History of Anesthesia Complications Negative for: history of anesthetic complications  Airway Mallampati: II  TM Distance: >3 FB Neck ROM: Full    Dental  (+) Poor Dentition   Pulmonary asthma , Current Smoker,    breath sounds clear to auscultation- rhonchi (-) wheezing      Cardiovascular Exercise Tolerance: Good hypertension, Pt. on medications (-) CAD, (-) Past MI, (-) Cardiac Stents and (-) CABG  Rhythm:Regular Rate:Normal - Systolic murmurs and - Diastolic murmurs    Neuro/Psych PSYCHIATRIC DISORDERS Anxiety Depression Bipolar Disorder negative neurological ROS     GI/Hepatic Neg liver ROS, GERD  ,  Endo/Other  negative endocrine ROSneg diabetes  Renal/GU negative Renal ROS     Musculoskeletal  (+) Arthritis ,   Abdominal (+) - obese,   Peds  Hematology negative hematology ROS (+)   Anesthesia Other Findings Past Medical History: No date: Allergy No date: Anxiety No date: Arthritis No date: Asthma No date: Bipolar 1 disorder ([facility]) No date: Chest pain No date: Depression No date: GERD (gastroesophageal reflux disease) No date: History of kidney stones No date: Hyperlipidemia No date: Hypertension No date: Insomnia No date: Mitral valve regurgitation No date: Osteoporosis No date: Peripheral neuropathy No date: Tricuspid valve regurgitation   Reproductive/Obstetrics                             Anesthesia Physical Anesthesia Plan  ASA: II  Anesthesia Plan: General   Post-op Pain Management:    Induction: Intravenous  PONV Risk Score and Plan: 1 and Ondansetron, Dexamethasone and Midazolam  Airway Management Planned: Oral ETT  Additional Equipment:   Intra-op Plan:    Post-operative Plan: Extubation in OR  Informed Consent: I have reviewed the patients History and Physical, chart, labs and discussed the procedure including the risks, benefits and alternatives for the proposed anesthesia with the patient or authorized representative who has indicated his/her understanding and acceptance.   Dental advisory given  Plan Discussed with: CRNA and Anesthesiologist  Anesthesia Plan Comments:         Anesthesia Quick Evaluation

## 2018-05-25 NOTE — Anesthesia Procedure Notes (Signed)
Procedure Name: LMA Insertion Date/Time: 05/25/2018 11:23 AM Performed by: Christain Sacramento, RN Pre-anesthesia Checklist: Patient identified, Patient being monitored, Timeout performed, Emergency Drugs available and Suction available Patient Re-evaluated:Patient Re-evaluated prior to induction Oxygen Delivery Method: Circle system utilized Preoxygenation: Pre-oxygenation with 100% oxygen Induction Type: IV induction Ventilation: Mask ventilation without difficulty LMA: LMA inserted Laryngoscope Size: Mac and 3 Grade View: Grade I Tube type: Oral Tube size: 7.0 mm Number of attempts: 1 Airway Equipment and Method: Stylet Placement Confirmation: positive ETCO2,  breath sounds checked- equal and bilateral and ETT inserted through vocal cords under direct vision Secured at: 21 cm Tube secured with: Tape Dental Injury: Teeth and Oropharynx as per pre-operative assessment

## 2018-05-25 NOTE — Op Note (Signed)
Misty Robbins, Misty Robbins MEDICAL RECORD FI:[id number] ACCOUNT [id number] DATE OF BIRTH:[birth year] FACILITY: [facility] LOCATION: [facility] PHYSICIAN:Harold Moncus Josefine Class, MD  OPERATIVE REPORT  DATE OF PROCEDURE:  05/25/2018  PREOPERATIVE DIAGNOSIS:   1.  Chronic pelvic pain. 2.  Dyspareunia.  POSTOPERATIVE DIAGNOSIS: 1.  Chronic pelvic pain. 2.  Dyspareunia. 3.  Endometriosis. 4.  Pelvic adhesions.  PROCEDURE: 1.  Laparoscopic assisted trachelectomy. 2.  Lysis of adhesions. 3.  Excision of peritoneal endometriosis. 4.  Retrograde filling of the bladder.  SURGEON:   Boykin Nearing, MD  FIRST ASSISTANTLeafy Ro.  ANESTHESIA:  General endotracheal anesthesia.  INDICATIONS:  A 47 year old gravida 3, para 3 patient, status post laparoscopic supracervical hysterectomy approximately 10 years ago.  She has also had bilateral salpingo-oophorectomy.  Approximately six months ago, the patient started having  dyspareunia and chronic pelvic pain.  DESCRIPTION OF PROCEDURE:  After adequate general endotracheal anesthesia, the patient was placed in dorsal supine position, legs in the [place]u stirrups.  The patient's abdomen, perineum and vagina were prepped and draped in normal sterile fashion.   Timeout was performed.  The patient did receive 2 grams IV Ancef prior to commencement of the case.  Straight catheterization of the bladder yielded 75 mL of urine.  Attention was directed to the patient's abdomen where a 5 mm infraumbilical incision was  made after injecting with 0.5% Marcaine.  The laparoscope was advanced into the abdominal cavity under direct visualization.  Once gaining access to the abdominal cavity.  The patient's abdomen was insufflated with carbon dioxide.  Adhesions noted at  the infraumbilical site with omentum draping down from the anterior abdominal wall to the posterior aspect of the patient's pelvis.  A second port site was placed 2 cm medial to the left anterior  iliac spine and under direct visualization.  The trocar  was advanced.  Similar procedure was repeated on the patient's right side.  Again 3 cm medial to the right anterior iliac spine and a 5 mm trocar was advanced under direct visualization.  Initial impression revealed again the omental adhesions draping  down the left bowel and serosal adhesions to the left sidewall.  There were several small blister vesicular nodules that were noted in the posterior cul-de-sac.  Harmonic scalpel was brought up and the peritoneum was excised around these vesicles and  will be sent to pathology for pathologic review.  Bilateral ureteral peristalsis was identified.  The omental adhesion was removed with the Harmonic scalpel as well as the left sidewall adhesions.    Attention was then directed vaginally and the bladder was redrained with additional 300 mL of urine.  Two thyroid tenaculums were placed on the cervix.  Cervix was then circumferentially injected with 0.5% lidocaine with 1:100,000 epinephrine.  A direct  posterior colpotomy incision was made.  Upon entry into the posterior cul-de-sac, the long billed speculum was placed.  The uterosacral ligaments were then bilaterally clamped, transected, suture ligated with 0 Vicryl suture.  The anterior cervix was  circumferentially incised with the Bovie.  Heaney clamps were then placed close to the cervical stump and ultimately all pedicles were tied with 0 Vicryl suture and the cervix was delivered.  The bladder was retrograde filled with 200 mL of water with  methylene blue.  There was no evidence of bladder injury.  All pedicles were hemostatic and the vaginal cuff was closed with a running 0 Vicryl suture.  Attention was then directed back to the abdominal cavity and laparoscopic evaluation revealed a  normal closed vaginal cuff.  Kleppingers were used to control a small amount of oozing on one aspect of the cuff.  Pressure was lowered to 7 mmHg and again good  hemostasis was noted.  The patient's abdomen was deflated and all incisions were closed with  interrupted 4-0 Vicryl suture and Dermabond placed at the skin level.  COMPLICATIONS:  There were no complications.  ESTIMATED BLOOD LOSS:  75 mL.  URINE OUTPUT:  400.    INTRAOPERATIVE FLUIDS:  1000 mL.  The patient did receive 30 mg of intravenous Toradol at the end of the case.    The patient was taken to recovery room in good condition.  AN/NUANCE  D:05/25/2018 T:05/25/2018 JOB:[id number]

## 2018-05-25 NOTE — Transfer of Care (Signed)
Immediate Anesthesia Transfer of Care Note  Patient: Misty Robbins  Procedure(s) Performed: LAPAROSCOPY DIAGNOSTIC (N/A ) LYSIS OF ADHESION (N/A ) TRACHELECTOMY (N/A )  Patient Location: PACU  Anesthesia Type:General  Level of Consciousness: sedated  Airway & Oxygen Therapy: Patient Spontanous Breathing and Patient connected to face mask oxygen  Post-op Assessment: Report given to RN and Post -op Vital signs reviewed and stable  Post vital signs: Reviewed and stable  Last Vitals:  Vitals Value Taken Time  BP    Temp    Pulse 64 05/25/2018  1:28 PM  Resp 15 05/25/2018  1:28 PM  SpO2 98 % 05/25/2018  1:28 PM  Vitals shown include unvalidated device data.  Last Pain:  Vitals:   05/25/18 0933  TempSrc: Oral  PainSc: 3          Complications: No apparent anesthesia complications

## 2018-05-25 NOTE — Anesthesia Post-op Follow-up Note (Signed)
Anesthesia QCDR form completed.        

## 2018-05-25 NOTE — Brief Op Note (Signed)
05/25/2018  1:15 PM  PATIENT:  Misty Robbins  47 y.o. female  PRE-OPERATIVE DIAGNOSIS:  Dyspareunia  POST-OPERATIVE DIAGNOSIS:  Dyspareunia Pelvic endometriosis, pelvic adhesions PROCEDURE:  Procedure(s): LAPAROSCOPY DIAGNOSTIC (N/A) LYSIS OF ADHESION (N/A) TRACHELECTOMY (N/A) Excision of pelvic endometriosis SURGEON:  Surgeon(s) and Role:    * Jenise Iannelli, [name], MD - Primary  PHYSICIAN ASSISTANT: Leafy Ro , B  MD  ASSISTANTS: none   ANESTHESIA:   general  EBL:  75 mL   BLOOD ADMINISTERED:none  DRAINS: none   LOCAL MEDICATIONS USED:  LIDOCAINE  and Amount: 8 ml  SPECIMEN:  Source of Specimen:  peritoneum excision of endometriosis cervcal stump DISPOSITION OF SPECIMEN:  PATHOLOGY  COUNTS:  YES  TOURNIQUET:  * No tourniquets in log *  DICTATION: .Other Dictation: Dictation Number verbal  PLAN OF CARE: Admit for overnight observation  PATIENT DISPOSITION:  PACU - hemodynamically stable.   Delay start of Pharmacological VTE agent (>24hrs) due to surgical blood loss or risk of bleeding: not applicable

## 2018-05-26 DIAGNOSIS — N736 Female pelvic peritoneal adhesions (postinfective): Secondary | ICD-10-CM | POA: Diagnosis not present

## 2018-05-26 DIAGNOSIS — N879 Dysplasia of cervix uteri, unspecified: Secondary | ICD-10-CM | POA: Diagnosis not present

## 2018-05-26 DIAGNOSIS — R102 Pelvic and perineal pain: Secondary | ICD-10-CM | POA: Diagnosis not present

## 2018-05-26 DIAGNOSIS — J45909 Unspecified asthma, uncomplicated: Secondary | ICD-10-CM | POA: Diagnosis not present

## 2018-05-26 DIAGNOSIS — E785 Hyperlipidemia, unspecified: Secondary | ICD-10-CM | POA: Diagnosis not present

## 2018-05-26 DIAGNOSIS — Z79899 Other long term (current) drug therapy: Secondary | ICD-10-CM | POA: Diagnosis not present

## 2018-05-26 DIAGNOSIS — I10 Essential (primary) hypertension: Secondary | ICD-10-CM | POA: Diagnosis not present

## 2018-05-26 DIAGNOSIS — G8929 Other chronic pain: Secondary | ICD-10-CM | POA: Diagnosis not present

## 2018-05-26 DIAGNOSIS — Z96653 Presence of artificial knee joint, bilateral: Secondary | ICD-10-CM | POA: Diagnosis not present

## 2018-05-26 DIAGNOSIS — N888 Other specified noninflammatory disorders of cervix uteri: Secondary | ICD-10-CM | POA: Diagnosis not present

## 2018-05-26 DIAGNOSIS — K219 Gastro-esophageal reflux disease without esophagitis: Secondary | ICD-10-CM | POA: Diagnosis not present

## 2018-05-26 DIAGNOSIS — Z7982 Long term (current) use of aspirin: Secondary | ICD-10-CM | POA: Diagnosis not present

## 2018-05-26 DIAGNOSIS — N3281 Overactive bladder: Secondary | ICD-10-CM | POA: Diagnosis not present

## 2018-05-26 LAB — SURGICAL PATHOLOGY

## 2018-05-26 MED ORDER — ONDANSETRON HCL 4 MG PO TABS: 4.0000 mg | ORAL_TABLET | Freq: Four times a day (QID) | ORAL | 0 refills | Status: DC | PRN

## 2018-05-26 MED ORDER — OXYCODONE HCL 5 MG PO TABS: 5.0000 mg | ORAL_TABLET | ORAL | 0 refills | Status: DC | PRN

## 2018-05-26 NOTE — Progress Notes (Signed)
Patient discharged home. Patient verbalized understanding of new medications, incision care after surgery, and when to follow up. Patient is stable and ready for discharge. Auxillary to wheel patient out with family.

## 2018-05-26 NOTE — Plan of Care (Signed)
Dr.Schermerhorn was notified of vital signs earlier (low temps and BP's); tolerating small amounts of regular diet; good po fluids; voiding; pain controlled with po percocet and IV morphine; ambulating independently; uses incentive spirometry; son at bedside; patient plans for discharge today.

## 2018-05-26 NOTE — Discharge Summary (Signed)
Physician Discharge Summary  Patient ID: Misty Robbins MRN: [id number] DOB/AGE: [birth year] 47 y.o.  Admit date: 05/25/2018 Discharge date: 05/26/2018  Admission Diagnoses:pelvic pain , dyspareunia  Discharge Diagnoses:  Active Problems:   Postoperative state   Discharged Condition: good  Hospital Course: pt underwent an uncomplicated laparoscopic cervical trachelectomy  And peritoneal biopsies for presumed endometriosis  Consults: None  Significant Diagnostic Studies: none  Treatments: surgery: as above  Discharge Exam: Blood pressure 138/79, pulse (!) 52, temperature (!) 97.5 F (36.4 C), temperature source Oral, resp. rate 18, height 5\' 7"  (1.702 m), weight 59.9 kg (132 lb), SpO2 100 %. General appearance: alert and cooperative Resp: clear to auscultation bilaterally Cardio: regular rate and rhythm, S1, S2 normal, no murmur, click, rub or gallop GI: soft, non-tender; bowel sounds normal; no masses,  no organomegaly  Disposition: Discharge disposition: 01-Home or Self Care       Discharge Instructions    Call MD for:  difficulty breathing, headache or visual disturbances   Complete by:  As directed    Call MD for:  extreme fatigue   Complete by:  As directed    Call MD for:  hives   Complete by:  As directed    Call MD for:  persistant dizziness or light-headedness   Complete by:  As directed    Call MD for:  persistant nausea and vomiting   Complete by:  As directed    Call MD for:  redness, tenderness, or signs of infection (pain, swelling, redness, odor or green/yellow discharge around incision site)   Complete by:  As directed    Call MD for:  severe uncontrolled pain   Complete by:  As directed    Call MD for:  temperature >100.4   Complete by:  As directed    Diet - low sodium heart healthy   Complete by:  As directed    Increase activity slowly   Complete by:  As directed      Allergies as of 05/26/2018      Reactions   Caffeine    "exaggerated  stimulant effect"   Hydrocodone Nausea And Vomiting   PROJECTILE VOMITING   Pseudoephedrine Other (See Comments)   HALLUCINATIONS      Medication List    TAKE these medications   albuterol 108 (90 Base) MCG/ACT inhaler Commonly known as:  PROVENTIL HFA;VENTOLIN HFA Inhale into the lungs every 6 (six) hours as needed for wheezing or shortness of breath.   CALCIUM 600+D 600-800 MG-UNIT Tabs Generic drug:  Calcium Carb-Cholecalciferol Take 1 tablet by mouth daily.   Cetirizine HCl 10 MG Caps Take 1 tablet (10MG ) by mouth daily What changed:    how much to take  how to take this  when to take this  additional instructions   dexlansoprazole 60 MG capsule Commonly known as:  DEXILANT Take 1 capsule (60 mg total) by mouth daily. What changed:  when to take this   estrogens (conjugated) 1.25 MG tablet Commonly known as:  PREMARIN Take 1 tablet (1.25 mg total) by mouth daily.   etodolac 500 MG tablet Commonly known as:  LODINE Take 500 mg by mouth 2 (two) times daily as needed (for pain/inflammation.).   ibuprofen 200 MG tablet Commonly known as:  ADVIL,MOTRIN Take 800 mg by mouth every 8 (eight) hours as needed (for pain.).   metoprolol tartrate 50 MG tablet Commonly known as:  LOPRESSOR TAKE 1 TABLET(50 MG) BY MOUTH TWICE DAILY   niacin 250 MG  tablet Take 250 mg by mouth daily.   ondansetron 4 MG tablet Commonly known as:  ZOFRAN Take 1 tablet (4 mg total) by mouth every 6 (six) hours as needed for nausea.   oxybutynin 10 MG 24 hr tablet Commonly known as:  DITROPAN-XL Take 10 mg by mouth daily.   oxyCODONE 5 MG immediate release tablet Commonly known as:  ROXICODONE Take 1-2 tablets (5-10 mg total) by mouth every 4 (four) hours as needed for moderate pain.   pravastatin 40 MG tablet Commonly known as:  PRAVACHOL Take 1 tablet (40 mg total) by mouth daily.   ranitidine 150 MG tablet Commonly known as:  RANITIDINE 150 MAX STRENGTH Take 1 tablet (150 mg  total) by mouth 2 (two) times daily.   traZODone 100 MG tablet Commonly known as:  DESYREL Take 1 tablet (100 mg total) by mouth at bedtime.      Follow-up Information    Misty Robbins, Misty Her, MD Follow up in 2 week(s).   Specialty:  Obstetrics and Gynecology Contact information: [address] [place] [address] [phone]           Signed: Gwen Robbins Misty Robbins 05/26/2018, 8:39 AM

## 2018-05-26 NOTE — Anesthesia Postprocedure Evaluation (Signed)
Anesthesia Post Note  Patient: Haja H Mignogna  Procedure(s) Performed: LAPAROSCOPY DIAGNOSTIC (N/A ) LYSIS OF ADHESION (N/A ) TRACHELECTOMY (N/A )  Patient location during evaluation: PACU Anesthesia Type: General Level of consciousness: awake and alert and oriented Pain management: pain level controlled Vital Signs Assessment: post-procedure vital signs reviewed and stable Respiratory status: spontaneous breathing, nonlabored ventilation and respiratory function stable Cardiovascular status: blood pressure returned to baseline and stable Postop Assessment: no signs of nausea or vomiting Anesthetic complications: no     Last Vitals:  Vitals:   05/25/18 2347 05/26/18 0257  BP: 130/79 (!) 143/80  Pulse: (!) 51 66  Resp: 18 18  Temp: 36.6 C 36.5 C  SpO2: 100% 100%    Last Pain:  Vitals:   05/26/18 0708  TempSrc:   PainSc: 8                  Zelphia Glover

## 2018-05-26 NOTE — Progress Notes (Signed)
Dr. Ouida Sills notified patient has taken maximum dose of Tylenol for 24 hours so PRN Percocet no longer available to give at this time. Patient is rating pain 8/10 but no medication available to give. No new order received, Dr. Ouida Sills to come see patient shortly.

## 2018-05-29 ENCOUNTER — Other Ambulatory Visit: Payer: Self-pay

## 2018-05-29 MED ORDER — TRAZODONE HCL 100 MG PO TABS: 100.0000 mg | ORAL_TABLET | Freq: Every day | ORAL | 2 refills | Status: DC

## 2018-06-09 DIAGNOSIS — R300 Dysuria: Secondary | ICD-10-CM | POA: Diagnosis not present

## 2018-06-09 DIAGNOSIS — N319 Neuromuscular dysfunction of bladder, unspecified: Secondary | ICD-10-CM | POA: Diagnosis not present

## 2018-06-09 DIAGNOSIS — H524 Presbyopia: Secondary | ICD-10-CM | POA: Diagnosis not present

## 2018-06-11 DIAGNOSIS — B078 Other viral warts: Secondary | ICD-10-CM | POA: Diagnosis not present

## 2018-06-23 ENCOUNTER — Other Ambulatory Visit: Payer: Self-pay

## 2018-06-23 DIAGNOSIS — M159 Polyosteoarthritis, unspecified: Secondary | ICD-10-CM

## 2018-06-23 DIAGNOSIS — M150 Primary generalized (osteo)arthritis: Principal | ICD-10-CM

## 2018-06-23 MED ORDER — TRAZODONE HCL 100 MG PO TABS: 100.0000 mg | ORAL_TABLET | Freq: Every day | ORAL | 2 refills | Status: DC

## 2018-06-23 MED ORDER — DEXLANSOPRAZOLE 60 MG PO CPDR: 60.0000 mg | DELAYED_RELEASE_CAPSULE | Freq: Every day | ORAL | 3 refills | Status: DC

## 2018-06-23 MED ORDER — METOPROLOL TARTRATE 50 MG PO TABS: ORAL_TABLET | ORAL | 3 refills | Status: DC

## 2018-06-23 MED ORDER — ETODOLAC 500 MG PO TABS: ORAL_TABLET | ORAL | 0 refills | Status: DC

## 2018-06-24 ENCOUNTER — Encounter: Payer: Self-pay | Admitting: Adult Health

## 2018-06-24 DIAGNOSIS — N3281 Overactive bladder: Secondary | ICD-10-CM | POA: Diagnosis not present

## 2018-07-07 ENCOUNTER — Encounter: Payer: Self-pay | Admitting: Internal Medicine

## 2018-07-23 ENCOUNTER — Other Ambulatory Visit: Payer: Self-pay

## 2018-07-23 MED ORDER — ESTROGENS CONJUGATED 1.25 MG PO TABS: 1.2500 mg | ORAL_TABLET | Freq: Every day | ORAL | 0 refills | Status: DC

## 2018-08-07 ENCOUNTER — Encounter: Payer: Self-pay | Admitting: Adult Health

## 2018-08-07 ENCOUNTER — Ambulatory Visit (INDEPENDENT_AMBULATORY_CARE_PROVIDER_SITE_OTHER): Payer: Medicare Other | Admitting: Adult Health

## 2018-08-07 VITALS — BP 104/62 | HR 52 | Temp 97.9°F | Resp 16 | Ht 67.0 in | Wt 138.0 lb

## 2018-08-07 DIAGNOSIS — H6690 Otitis media, unspecified, unspecified ear: Secondary | ICD-10-CM | POA: Diagnosis not present

## 2018-08-07 DIAGNOSIS — K219 Gastro-esophageal reflux disease without esophagitis: Secondary | ICD-10-CM

## 2018-08-07 MED ORDER — AMOXICILLIN-POT CLAVULANATE 875-125 MG PO TABS
1.0000 | ORAL_TABLET | Freq: Two times a day (BID) | ORAL | 0 refills | Status: DC
Start: 2018-08-07 — End: 2019-03-12

## 2018-08-07 NOTE — Progress Notes (Signed)
[facility] [place],  [id number]  Internal MEDICINE  Office Visit Note  Patient Name: Misty Robbins  [id number]  [id number]  Date of Service: 08/07/2018  Chief Complaint  Patient presents with  . Ear Pain    left ear stabbing pain started three days ago     HPI Pt is here for a sick visit. She reports 3 days of left ear pain that she describes as a stabbing pain. She reports the pain moves into her face, and around her head.  Denies fever or chills.  She has been taking aleve and ibuprofen.  She also tried OTC Ear ache relief drops.     Current Medication:  Outpatient Encounter Medications as of 08/07/2018  Medication Sig  . albuterol (PROVENTIL HFA;VENTOLIN HFA) 108 (90 Base) MCG/ACT inhaler Inhale into the lungs every 6 (six) hours as needed for wheezing or shortness of breath.  . Calcium Carb-Cholecalciferol (CALCIUM 600+D) 600-800 MG-UNIT TABS Take 1 tablet by mouth daily.  . Cetirizine HCl 10 MG CAPS Take 1 tablet (10MG ) by mouth daily (Patient taking differently: Take 10 mg by mouth at bedtime. )  . dexlansoprazole (DEXILANT) 60 MG capsule Take 1 capsule (60 mg total) by mouth daily.  Marland Kitchen estrogens, conjugated, (PREMARIN) 1.25 MG tablet Take 1 tablet (1.25 mg total) by mouth daily.  Marland Kitchen etodolac (LODINE) 500 MG tablet Take one tablet by mouth twice daily as needed for pain  . ibuprofen (ADVIL,MOTRIN) 200 MG tablet Take 800 mg by mouth every 8 (eight) hours as needed (for pain.).  Marland Kitchen metoprolol tartrate (LOPRESSOR) 50 MG tablet TAKE 1 TABLET(50 MG) BY MOUTH TWICE DAILY  . niacin 250 MG tablet Take 250 mg by mouth daily.   . ondansetron (ZOFRAN) 4 MG tablet Take 1 tablet (4 mg total) by mouth every 6 (six) hours as needed for nausea.  Marland Kitchen oxybutynin (DITROPAN-XL) 10 MG 24 hr tablet Take 10 mg by mouth daily.   Marland Kitchen oxyCODONE (ROXICODONE) 5 MG immediate release tablet Take 1-2 tablets (5-10 mg total) by mouth every 4 (four) hours as needed for moderate  pain.  . pravastatin (PRAVACHOL) 40 MG tablet Take 1 tablet (40 mg total) by mouth daily.  . ranitidine (RANITIDINE 150 MAX STRENGTH) 150 MG tablet Take 1 tablet (150 mg total) by mouth 2 (two) times daily.  . traZODone (DESYREL) 100 MG tablet Take 1 tablet (100 mg total) by mouth at bedtime.  Marland Kitchen amoxicillin-clavulanate (AUGMENTIN) 875-125 MG tablet Take 1 tablet by mouth 2 (two) times daily.   No facility-administered encounter medications on file as of 08/07/2018.       Medical History: Past Medical History:  Diagnosis Date  . Allergy   . Anxiety   . Arthritis   . Asthma   . Bipolar 1 disorder ([place])   . Chest pain   . Depression   . GERD (gastroesophageal reflux disease)   . History of kidney stones   . Hyperlipidemia   . Hypertension   . Insomnia   . Mitral valve regurgitation   . Osteoporosis   . Peripheral neuropathy   . Tricuspid valve regurgitation      Vital Signs: BP 104/62   Pulse (!) 52   Temp 97.9 F (36.6 C)   Resp 16   Ht 5\' 7"  (1.702 m)   Wt 138 lb (62.6 kg)   SpO2 99%   BMI 21.61 kg/m    Review of Systems  Constitutional: Negative for chills, fatigue, fever and  unexpected weight change.  HENT: Positive for ear pain and hearing loss. Negative for congestion, rhinorrhea, sneezing and sore throat.   Eyes: Negative for photophobia, pain and redness.  Respiratory: Negative for cough, chest tightness and shortness of breath.   Cardiovascular: Negative for chest pain and palpitations.  Gastrointestinal: Negative for abdominal pain, constipation, diarrhea, nausea and vomiting.  Endocrine: Negative.   Genitourinary: Negative for dysuria and frequency.  Musculoskeletal: Negative for arthralgias, back pain, joint swelling and neck pain.  Skin: Negative for rash.  Allergic/Immunologic: Negative.   Neurological: Negative for tremors and numbness.  Hematological: Negative for adenopathy. Does not bruise/bleed easily.  Psychiatric/Behavioral: Negative for  behavioral problems and sleep disturbance. The patient is not nervous/anxious.     Physical Exam  Constitutional: She is oriented to person, place, and time. She appears well-developed and well-nourished. No distress.  HENT:  Head: Normocephalic and atraumatic.  Right Ear: Hearing, tympanic membrane, external ear and ear canal normal.  Left Ear: External ear normal. There is swelling and tenderness. Tympanic membrane is bulging. A middle ear effusion is present.  Mouth/Throat: Oropharynx is clear and moist. No oropharyngeal exudate.  Eyes: Pupils are equal, round, and reactive to light. EOM are normal.  Neck: Normal range of motion. Neck supple. No JVD present. No tracheal deviation present. No thyromegaly present.  Cardiovascular: Normal rate, regular rhythm and normal heart sounds. Exam reveals no gallop and no friction rub.  No murmur heard. Pulmonary/Chest: Effort normal and breath sounds normal. No respiratory distress. She has no wheezes. She has no rales. She exhibits no tenderness.  Abdominal: Soft. There is no tenderness. There is no guarding.  Musculoskeletal: Normal range of motion.  Lymphadenopathy:    She has no cervical adenopathy.  Neurological: She is alert and oriented to person, place, and time. No cranial nerve deficit.  Skin: Skin is warm and dry. She is not diaphoretic.  Psychiatric: She has a normal mood and affect. Her behavior is normal. Judgment and thought content normal.  Nursing note and vitals reviewed.  Assessment/Plan: 1. Acute otitis media, unspecified otitis media type Will give course of augmentin, instructed patient to return to clinic if no better in 7 days or if fever develops. - amoxicillin-clavulanate (AUGMENTIN) 875-125 MG tablet; Take 1 tablet by mouth 2 (two) times daily.  Dispense: 20 tablet; Refill: 0  2. Gastroesophageal reflux disease without esophagitis Stable, continue current therapy.  General Counseling: Misty Robbins verbalizes understanding  of the findings of todays visit and agrees with plan of treatment. I have discussed any further diagnostic evaluation that may be needed or ordered today. We also reviewed her medications today. she has been encouraged to call the office with any questions or concerns that should arise related to todays visit.   No orders of the defined types were placed in this encounter.   Meds ordered this encounter  Medications  . amoxicillin-clavulanate (AUGMENTIN) 875-125 MG tablet    Sig: Take 1 tablet by mouth 2 (two) times daily.    Dispense:  20 tablet    Refill:  0    Time spent: 25 Minutes  This patient was seen by Orson Gear AGNP-C in Collaboration with Dr Lavera Guise as a part of collaborative care agreement.  Kendell Bane AGNP-C Internal Medicine

## 2018-08-07 NOTE — Patient Instructions (Signed)
Otitis Media, Adult Otitis media is redness, soreness, and puffiness (swelling) in the space just behind your eardrum (middle ear). It may be caused by allergies or infection. It often happens along with a cold. Follow these instructions at home:  Take your medicine as told. Finish it even if you start to feel better.  Only take over-the-counter or prescription medicines for pain, discomfort, or fever as told by your doctor.  Follow up with your doctor as told. Contact a doctor if:  You have otitis media only in one ear, or bleeding from your nose, or both.  You notice a lump on your neck.  You are not getting better in 3-5 days.  You feel worse instead of better. Get help right away if:  You have pain that is not helped with medicine.  You have puffiness, redness, or pain around your ear.  You get a stiff neck.  You cannot move part of your face (paralysis).  You notice that the bone behind your ear hurts when you touch it. This information is not intended to replace advice given to you by your health care provider. Make sure you discuss any questions you have with your health care provider. Document Released: 04/01/2008 Document Revised: 03/21/2016 Document Reviewed: 05/11/2013 Elsevier Interactive Patient Education  2017 [employer].  

## 2018-08-22 ENCOUNTER — Other Ambulatory Visit: Payer: Self-pay | Admitting: Internal Medicine

## 2018-09-21 ENCOUNTER — Other Ambulatory Visit: Payer: Self-pay | Admitting: Adult Health

## 2018-09-28 ENCOUNTER — Other Ambulatory Visit: Payer: Self-pay | Admitting: Adult Health

## 2018-09-28 DIAGNOSIS — M159 Polyosteoarthritis, unspecified: Secondary | ICD-10-CM

## 2018-09-28 DIAGNOSIS — M150 Primary generalized (osteo)arthritis: Principal | ICD-10-CM

## 2018-09-28 MED ORDER — ETODOLAC 500 MG PO TABS: ORAL_TABLET | ORAL | 0 refills | Status: DC

## 2018-10-21 ENCOUNTER — Other Ambulatory Visit: Payer: Self-pay | Admitting: Adult Health

## 2018-11-10 ENCOUNTER — Other Ambulatory Visit: Payer: Self-pay

## 2018-11-10 MED ORDER — DEXLANSOPRAZOLE 60 MG PO CPDR: 60.0000 mg | DELAYED_RELEASE_CAPSULE | Freq: Every day | ORAL | 3 refills | Status: DC

## 2018-11-12 ENCOUNTER — Ambulatory Visit: Payer: Self-pay | Admitting: Nurse Practitioner

## 2018-11-24 ENCOUNTER — Other Ambulatory Visit: Payer: Self-pay | Admitting: Adult Health

## 2018-12-09 ENCOUNTER — Other Ambulatory Visit: Payer: Self-pay

## 2018-12-09 MED ORDER — TRAZODONE HCL 100 MG PO TABS: 100.0000 mg | ORAL_TABLET | Freq: Every day | ORAL | 0 refills | Status: DC

## 2018-12-09 NOTE — Telephone Encounter (Signed)
lmom pt need appt for further refills  

## 2018-12-18 ENCOUNTER — Other Ambulatory Visit: Payer: Self-pay | Admitting: Internal Medicine

## 2018-12-18 ENCOUNTER — Other Ambulatory Visit: Payer: Self-pay | Admitting: Adult Health

## 2019-01-09 ENCOUNTER — Other Ambulatory Visit: Payer: Self-pay | Admitting: Internal Medicine

## 2019-01-09 ENCOUNTER — Other Ambulatory Visit: Payer: Self-pay | Admitting: Adult Health

## 2019-01-15 ENCOUNTER — Ambulatory Visit (INDEPENDENT_AMBULATORY_CARE_PROVIDER_SITE_OTHER): Payer: Medicare Other | Admitting: Nurse Practitioner

## 2019-01-15 ENCOUNTER — Other Ambulatory Visit: Payer: Self-pay

## 2019-01-15 ENCOUNTER — Encounter: Payer: Self-pay | Admitting: Nurse Practitioner

## 2019-01-15 VITALS — BP 144/93 | HR 60 | Resp 16 | Ht 67.0 in | Wt 140.4 lb

## 2019-01-15 DIAGNOSIS — K219 Gastro-esophageal reflux disease without esophagitis: Secondary | ICD-10-CM | POA: Diagnosis not present

## 2019-01-15 DIAGNOSIS — I10 Essential (primary) hypertension: Secondary | ICD-10-CM

## 2019-01-15 DIAGNOSIS — I059 Rheumatic mitral valve disease, unspecified: Secondary | ICD-10-CM | POA: Diagnosis not present

## 2019-01-15 DIAGNOSIS — Z1239 Encounter for other screening for malignant neoplasm of breast: Secondary | ICD-10-CM | POA: Diagnosis not present

## 2019-01-15 DIAGNOSIS — Z0001 Encounter for general adult medical examination with abnormal findings: Secondary | ICD-10-CM | POA: Insufficient documentation

## 2019-01-15 DIAGNOSIS — N949 Unspecified condition associated with female genital organs and menstrual cycle: Secondary | ICD-10-CM | POA: Insufficient documentation

## 2019-01-15 MED ORDER — LISINOPRIL 10 MG PO TABS: 10.0000 mg | ORAL_TABLET | Freq: Every day | ORAL | 3 refills | Status: DC

## 2019-01-15 NOTE — Progress Notes (Signed)
Pt blood pressure elevated, informed provider. 

## 2019-01-15 NOTE — Progress Notes (Signed)
[facility] [place], [place] [address]  Internal MEDICINE  Office Visit Note  Patient Name: Misty Robbins  [id number]  [id number]  Date of Service: 01/15/2019  Chief Complaint  Patient presents with  . Medical Management of Chronic Issues    follow up, medication refill,  . Hypertension    pt blood pressure has been ranging between 140-160's and she is concerned because she is taking the prescribed medications    The patient is c/o persistent elevation of blood pressure. Currently taking metoprolol 50mg  twice daily. Takes blood pressure at home. Generally states that systolic pressure is between 140 and 160. Has chronic headaches as well. Denies presence of chest pain or chest pressure. Has history of mild mitral and tricuspid valve prolapse.  She is due to have screening mammogram as well as routine physical .      Current Medication: Outpatient Encounter Medications as of 01/15/2019  Medication Sig  . albuterol (PROVENTIL HFA;VENTOLIN HFA) 108 (90 Base) MCG/ACT inhaler Inhale into the lungs every 6 (six) hours as needed for wheezing or shortness of breath.  Marland Kitchen amoxicillin-clavulanate (AUGMENTIN) 875-125 MG tablet Take 1 tablet by mouth 2 (two) times daily.  . Calcium Carb-Cholecalciferol (CALCIUM 600+D) 600-800 MG-UNIT TABS Take 1 tablet by mouth daily.  . Cetirizine HCl 10 MG CAPS Take 1 tablet (10MG ) by mouth daily (Patient taking differently: Take 10 mg by mouth at bedtime. )  . dexlansoprazole (DEXILANT) 60 MG capsule Take 1 capsule (60 mg total) by mouth daily.  Marland Kitchen etodolac (LODINE) 500 MG tablet Take one tablet by mouth twice daily as needed for pain  . ibuprofen (ADVIL,MOTRIN) 200 MG tablet Take 800 mg by mouth every 8 (eight) hours as needed (for pain.).  Marland Kitchen metoprolol tartrate (LOPRESSOR) 50 MG tablet TAKE 1 TABLET(50 MG) BY MOUTH TWICE DAILY  . niacin 250 MG tablet Take 250 mg by mouth daily.   . ondansetron (ZOFRAN) 4 MG tablet Take 1 tablet (4  mg total) by mouth every 6 (six) hours as needed for nausea.  Marland Kitchen oxybutynin (DITROPAN-XL) 10 MG 24 hr tablet Take 10 mg by mouth daily.   Marland Kitchen oxyCODONE (ROXICODONE) 5 MG immediate release tablet Take 1-2 tablets (5-10 mg total) by mouth every 4 (four) hours as needed for moderate pain.  . pravastatin (PRAVACHOL) 40 MG tablet TAKE 1 TABLET(40 MG) BY MOUTH DAILY  . PREMARIN 1.25 MG tablet TAKE 1 TABLET BY MOUTH DAILY  . ranitidine (RANITIDINE 150 MAX STRENGTH) 150 MG tablet Take 1 tablet (150 mg total) by mouth 2 (two) times daily.  . traZODone (DESYREL) 100 MG tablet TAKE 1 TABLET(100 MG) BY MOUTH AT BEDTIME  . lisinopril (PRINIVIL,ZESTRIL) 10 MG tablet Take 1 tablet (10 mg total) by mouth daily.   No facility-administered encounter medications on file as of 01/15/2019.     Surgical History: Past Surgical History:  Procedure Laterality Date  . ABDOMINAL HYSTERECTOMY  2000  . APPENDECTOMY    . BACK SURGERY    . CERVICAL FUSION  [id number]   c2-7  . COLONOSCOPY WITH PROPOFOL N/A 04/13/2018   Procedure: COLONOSCOPY WITH PROPOFOL;  Surgeon: Lucilla Lame, MD;  Location: [facility];  Service: Endoscopy;  Laterality: N/A;  . ESOPHAGOGASTRODUODENOSCOPY (EGD) WITH PROPOFOL N/A 04/13/2018   Procedure: ESOPHAGOGASTRODUODENOSCOPY (EGD) WITH PROPOFOL;  Surgeon: Lucilla Lame, MD;  Location: [place];  Service: Endoscopy;  Laterality: N/A;  . LAPAROSCOPY N/A 05/25/2018   Procedure: LAPAROSCOPY DIAGNOSTIC;  Surgeon: Boykin Nearing, MD;  Location: [facility];  Service: Gynecology;  Laterality: N/A;  . LYSIS OF ADHESION N/A 05/25/2018   Procedure: LYSIS OF ADHESION;  Surgeon: Ouida Sills, [name], MD;  Location: [facility];  Service: Gynecology;  Laterality: N/A;  . POLYPECTOMY  04/13/2018   Procedure: POLYPECTOMY;  Surgeon: Lucilla Lame, MD;  Location: [place];  Service: Endoscopy;;  . SKIN GRAFT  1981   from right thigh to bilateral ankles and right knee  . TONSILLECTOMY   2013  . TRACHELECTOMY N/A 05/25/2018   Procedure: TRACHELECTOMY;  Surgeon: Schermerhorn, [name], MD;  Location: [facility];  Service: Gynecology;  Laterality: N/A;    Medical History: Past Medical History:  Diagnosis Date  . Allergy   . Anxiety   . Arthritis   . Asthma   . Bipolar 1 disorder ([place])   . Chest pain   . Depression   . GERD (gastroesophageal reflux disease)   . History of kidney stones   . Hyperlipidemia   . Hypertension   . Insomnia   . Mitral valve regurgitation   . Osteoporosis   . Peripheral neuropathy   . Tricuspid valve regurgitation     Family History: Family History  Problem Relation Age of Onset  . Breast cancer Paternal Aunt        63's  . Heart disease Mother   . Hypertension Mother   . Stroke Mother   . Stroke Father   . Cancer Father   . Heart disease Father   . Diabetes Sister   . Diabetes Brother     Social History   Socioeconomic History  . Marital status: Married    Spouse name: Not on file  . Number of children: Not on file  . Years of education: Not on file  . Highest education level: Not on file  Occupational History  . Not on file  Social Needs  . Financial resource strain: Not on file  . Food insecurity:    Worry: Not on file    Inability: Not on file  . Transportation needs:    Medical: Not on file    Non-medical: Not on file  Tobacco Use  . Smoking status: Current Every Day Smoker    Packs/day: 0.50    Years: 25.00    Pack years: 12.50    Types: Cigarettes  . Smokeless tobacco: Never Used  Substance and Sexual Activity  . Alcohol use: Yes    Comment: occassional  . Drug use: No  . Sexual activity: Not on file  Lifestyle  . Physical activity:    Days per week: Not on file    Minutes per session: Not on file  . Stress: Not on file  Relationships  . Social connections:    Talks on phone: Not on file    Gets together: Not on file    Attends religious service: Not on file    Active member of club or  organization: Not on file    Attends meetings of clubs or organizations: Not on file    Relationship status: Not on file  . Intimate partner violence:    Fear of current or ex partner: Not on file    Emotionally abused: Not on file    Physically abused: Not on file    Forced sexual activity: Not on file  Other Topics Concern  . Not on file  Social History Narrative  . Not on file      Review of Systems  Constitutional: Negative for  chills, fatigue and unexpected weight change.  HENT: Negative for congestion, postnasal drip, rhinorrhea, sneezing and sore throat.   Respiratory: Negative for cough, chest tightness, shortness of breath and wheezing.   Cardiovascular: Negative for chest pain and palpitations.       Elevated blood pressure  Gastrointestinal: Negative for abdominal pain, constipation, diarrhea, nausea and vomiting.  Endocrine: Negative for cold intolerance, heat intolerance, polydipsia and polyuria.  Musculoskeletal: Negative for arthralgias, back pain, joint swelling and neck pain.  Skin: Negative for rash.  Neurological: Positive for headaches. Negative for tremors and numbness.  Hematological: Negative for adenopathy. Does not bruise/bleed easily.  Psychiatric/Behavioral: Negative for behavioral problems (Depression), sleep disturbance and suicidal ideas. The patient is not nervous/anxious.     Today's Vitals   01/15/19 1013  BP: (!) 144/93  Pulse: 60  Resp: 16  SpO2: 99%  Weight: 140 lb 6.4 oz (63.7 kg)  Height: 5\' 7"  (1.702 m)   Body mass index is 21.99 kg/m.  Physical Exam Vitals signs and nursing note reviewed.  Constitutional:      General: She is not in acute distress.    Appearance: Normal appearance. She is well-developed. She is not diaphoretic.  HENT:     Head: Normocephalic and atraumatic.     Mouth/Throat:     Pharynx: No oropharyngeal exudate.  Eyes:     Pupils: Pupils are equal, round, and reactive to light.  Neck:     Musculoskeletal:  Normal range of motion and neck supple.     Thyroid: No thyromegaly.     Vascular: No JVD.     Trachea: No tracheal deviation.  Cardiovascular:     Rate and Rhythm: Normal rate and regular rhythm.     Heart sounds: Normal heart sounds. No murmur. No friction rub. No gallop.   Pulmonary:     Effort: Pulmonary effort is normal. No respiratory distress.     Breath sounds: Normal breath sounds. No wheezing or rales.  Chest:     Chest wall: No tenderness.  Abdominal:     General: Bowel sounds are normal.     Palpations: Abdomen is soft.     Tenderness: There is no abdominal tenderness.  Musculoskeletal: Normal range of motion.  Lymphadenopathy:     Cervical: No cervical adenopathy.  Skin:    General: Skin is warm and dry.  Neurological:     Mental Status: She is alert and oriented to person, place, and time.     Cranial Nerves: No cranial nerve deficit.  Psychiatric:        Behavior: Behavior normal.        Thought Content: Thought content normal.        Judgment: Judgment normal.    Assessment/Plan: 1. Essential hypertension Add lisinopril 10mg  daily. Continue metoprolol 50mg  twice daily. Get echocardiogram for further evaluation. - lisinopril (PRINIVIL,ZESTRIL) 10 MG tablet; Take 1 tablet (10 mg total) by mouth daily.  Dispense: 30 tablet; Refill: 3 - ECHOCARDIOGRAM COMPLETE; Future  2. Mitral valve disorder Get echocardiogram for further evaluation. - ECHOCARDIOGRAM COMPLETE; Future  3. Gastroesophageal reflux disease without esophagitis Improving. Reviewed results of UGI with barium as well as endoscopy and colonoscopy results. Continue dexilant and zantac as prescribed   4. Screening for breast cancer - MM DIGITAL SCREENING BILATERAL; Future  General Counseling: Pelagia verbalizes understanding of the findings of todays visit and agrees with plan of treatment. I have discussed any further diagnostic evaluation that may be needed or ordered today. We also  reviewed her  medications today. she has been encouraged to call the office with any questions or concerns that should arise related to todays visit.  Hypertension Counseling:   The following hypertensive lifestyle modification were recommended and discussed:  1. Limiting alcohol intake to less than 1 oz/day of ethanol:(24 oz of beer or 8 oz of wine or 2 oz of 100-proof whiskey). 2. Take baby ASA 81 mg daily. 3. Importance of regular aerobic exercise and losing weight. 4. Reduce dietary saturated fat and cholesterol intake for overall cardiovascular health. 5. Maintaining adequate dietary potassium, calcium, and magnesium intake. 6. Regular monitoring of the blood pressure. 7. Reduce sodium intake to less than 100 mmol/day (less than 2.3 gm of sodium or less than 6 gm of sodium choride)   This patient was seen by [place] with Dr Lavera Guise as a part of collaborative care agreement  Orders Placed This Encounter  Procedures  . MM DIGITAL SCREENING BILATERAL  . ECHOCARDIOGRAM COMPLETE    Meds ordered this encounter  Medications  . lisinopril (PRINIVIL,ZESTRIL) 10 MG tablet    Sig: Take 1 tablet (10 mg total) by mouth daily.    Dispense:  30 tablet    Refill:  3    Order Specific Question:   Supervising Provider    Answer:   Lavera Guise [[id number]]    Time spent: 69 Minutes      Dr Lavera Guise Internal medicine

## 2019-01-21 ENCOUNTER — Other Ambulatory Visit: Payer: Self-pay

## 2019-01-21 MED ORDER — ESTROGENS CONJUGATED 1.25 MG PO TABS: 1.2500 mg | ORAL_TABLET | Freq: Every day | ORAL | 3 refills | Status: DC

## 2019-01-21 MED ORDER — PRAVASTATIN SODIUM 40 MG PO TABS: ORAL_TABLET | ORAL | 5 refills | Status: DC

## 2019-01-29 ENCOUNTER — Other Ambulatory Visit: Payer: Medicare Other

## 2019-02-08 ENCOUNTER — Other Ambulatory Visit: Payer: Self-pay

## 2019-02-08 MED ORDER — TRAZODONE HCL 100 MG PO TABS: ORAL_TABLET | ORAL | 0 refills | Status: DC

## 2019-02-20 ENCOUNTER — Other Ambulatory Visit: Payer: Self-pay | Admitting: Adult Health

## 2019-02-25 ENCOUNTER — Ambulatory Visit: Payer: Medicare Other | Admitting: Nurse Practitioner

## 2019-02-26 ENCOUNTER — Other Ambulatory Visit: Payer: Medicare Other

## 2019-03-05 ENCOUNTER — Other Ambulatory Visit: Payer: Self-pay | Admitting: Nurse Practitioner

## 2019-03-05 ENCOUNTER — Ambulatory Visit: Payer: Medicare Other

## 2019-03-05 ENCOUNTER — Other Ambulatory Visit: Payer: Self-pay

## 2019-03-05 DIAGNOSIS — I10 Essential (primary) hypertension: Secondary | ICD-10-CM

## 2019-03-05 DIAGNOSIS — I349 Nonrheumatic mitral valve disorder, unspecified: Secondary | ICD-10-CM | POA: Diagnosis not present

## 2019-03-05 DIAGNOSIS — I059 Rheumatic mitral valve disease, unspecified: Secondary | ICD-10-CM

## 2019-03-05 MED ORDER — TRAZODONE HCL 100 MG PO TABS: ORAL_TABLET | ORAL | 1 refills | Status: DC

## 2019-03-12 ENCOUNTER — Encounter: Payer: Self-pay | Admitting: Nurse Practitioner

## 2019-03-12 ENCOUNTER — Ambulatory Visit (INDEPENDENT_AMBULATORY_CARE_PROVIDER_SITE_OTHER): Payer: Medicare Other | Admitting: Nurse Practitioner

## 2019-03-12 ENCOUNTER — Other Ambulatory Visit: Payer: Self-pay

## 2019-03-12 VITALS — BP 154/88 | HR 70 | Resp 16 | Ht 67.0 in | Wt 140.8 lb

## 2019-03-12 DIAGNOSIS — Z0001 Encounter for general adult medical examination with abnormal findings: Secondary | ICD-10-CM

## 2019-03-12 DIAGNOSIS — I10 Essential (primary) hypertension: Secondary | ICD-10-CM

## 2019-03-12 DIAGNOSIS — Z1239 Encounter for other screening for malignant neoplasm of breast: Secondary | ICD-10-CM

## 2019-03-12 DIAGNOSIS — R300 Dysuria: Secondary | ICD-10-CM | POA: Diagnosis not present

## 2019-03-12 MED ORDER — LISINOPRIL 20 MG PO TABS: 20.0000 mg | ORAL_TABLET | Freq: Every day | ORAL | 3 refills | Status: DC

## 2019-03-12 MED ORDER — METOPROLOL TARTRATE 100 MG PO TABS: ORAL_TABLET | ORAL | 1 refills | Status: DC

## 2019-03-12 NOTE — Progress Notes (Signed)
Pt blood pressure elevated taken twice 1st reading 171/94 2nd reading 154/88 Informed provider,

## 2019-03-12 NOTE — Progress Notes (Signed)
[facility] [place], [place] [address]  Internal MEDICINE  Office Visit Note  Patient Name: Misty Robbins  [id number]  [id number]  Date of Service: 03/29/2019   Pt is here for routine health maintenance examination  Chief Complaint  Patient presents with  . Medicare Wellness    medication concerns lisinopril may not be working  . Hypertension  . Hyperlipidemia  . Gastroesophageal Reflux  . Labs Only    Review echo     The patient is here for health maintenance exam. Blood pressure elevated. Was started on lisinopril 10mg  along with the current dose of toprol BID. She states that bloodpressure improved for a few days. Ran 120/80 for about three days, however, since then, blood pressure is running back In 016W and 109N systolic. She is having chronic headahces since blood pressure is elevated.     Current Medication: Outpatient Encounter Medications as of 03/12/2019  Medication Sig  . albuterol (PROVENTIL HFA;VENTOLIN HFA) 108 (90 Base) MCG/ACT inhaler Inhale into the lungs every 6 (six) hours as needed for wheezing or shortness of breath.  . Calcium Carb-Cholecalciferol (CALCIUM 600+D) 600-800 MG-UNIT TABS Take 1 tablet by mouth daily.  . Cetirizine HCl 10 MG CAPS Take 1 tablet (10MG ) by mouth daily (Patient taking differently: Take 10 mg by mouth at bedtime. )  . DEXILANT 60 MG capsule TAKE 1 CAPSULE(60 MG) BY MOUTH DAILY  . estrogens, conjugated, (PREMARIN) 1.25 MG tablet Take 1 tablet (1.25 mg total) by mouth daily.  Marland Kitchen etodolac (LODINE) 500 MG tablet Take one tablet by mouth twice daily as needed for pain  . ibuprofen (ADVIL) 800 MG tablet Take 1 tablet by mouth as needed.  Marland Kitchen lisinopril (ZESTRIL) 20 MG tablet Take 1 tablet (20 mg total) by mouth daily.  . metoprolol tartrate (LOPRESSOR) 100 MG tablet Take 1 tablet po BID  . niacin 250 MG tablet Take 250 mg by mouth daily.   . ondansetron (ZOFRAN) 4 MG tablet Take 1 tablet (4 mg total) by mouth  every 6 (six) hours as needed for nausea.  Marland Kitchen oxybutynin (DITROPAN-XL) 10 MG 24 hr tablet Take 10 mg by mouth daily.   Marland Kitchen oxyCODONE (ROXICODONE) 5 MG immediate release tablet Take 1-2 tablets (5-10 mg total) by mouth every 4 (four) hours as needed for moderate pain.  . pravastatin (PRAVACHOL) 40 MG tablet TAKE 1 TABLET(40 MG) BY MOUTH DAILY  . ranitidine (RANITIDINE 150 MAX STRENGTH) 150 MG tablet Take 1 tablet (150 mg total) by mouth 2 (two) times daily.  . traZODone (DESYREL) 100 MG tablet TAKE 1 TABLET(100 MG) BY MOUTH AT BEDTIME  . [DISCONTINUED] lisinopril (PRINIVIL,ZESTRIL) 10 MG tablet Take 1 tablet (10 mg total) by mouth daily.  . [DISCONTINUED] metoprolol tartrate (LOPRESSOR) 50 MG tablet TAKE 1 TABLET(50 MG) BY MOUTH TWICE DAILY  . [DISCONTINUED] amoxicillin-clavulanate (AUGMENTIN) 875-125 MG tablet Take 1 tablet by mouth 2 (two) times daily. (Patient not taking: Reported on 03/12/2019)  . [DISCONTINUED] ibuprofen (ADVIL,MOTRIN) 200 MG tablet Take 800 mg by mouth every 8 (eight) hours as needed (for pain.).   No facility-administered encounter medications on file as of 03/12/2019.     Surgical History: Past Surgical History:  Procedure Laterality Date  . ABDOMINAL HYSTERECTOMY  2000  . APPENDECTOMY    . BACK SURGERY    . CERVICAL FUSION  [id number]   c2-7  . COLONOSCOPY WITH PROPOFOL N/A 04/13/2018   Procedure: COLONOSCOPY WITH PROPOFOL;  Surgeon: Lucilla Lame, MD;  Location: [facility];  Service: Endoscopy;  Laterality: N/A;  . ESOPHAGOGASTRODUODENOSCOPY (EGD) WITH PROPOFOL N/A 04/13/2018   Procedure: ESOPHAGOGASTRODUODENOSCOPY (EGD) WITH PROPOFOL;  Surgeon: Lucilla Lame, MD;  Location: [place];  Service: Endoscopy;  Laterality: N/A;  . LAPAROSCOPY N/A 05/25/2018   Procedure: LAPAROSCOPY DIAGNOSTIC;  Surgeon: Ouida Sills [name], MD;  Location: [facility];  Service: Gynecology;  Laterality: N/A;  . LYSIS OF ADHESION N/A 05/25/2018   Procedure: LYSIS OF ADHESION;   Surgeon: Ouida Sills, [name], MD;  Location: [facility];  Service: Gynecology;  Laterality: N/A;  . POLYPECTOMY  04/13/2018   Procedure: POLYPECTOMY;  Surgeon: Lucilla Lame, MD;  Location: [place];  Service: Endoscopy;;  . SKIN GRAFT  1981   from right thigh to bilateral ankles and right knee  . TONSILLECTOMY  2013  . TRACHELECTOMY N/A 05/25/2018   Procedure: TRACHELECTOMY;  Surgeon: Schermerhorn, [name], MD;  Location: [facility];  Service: Gynecology;  Laterality: N/A;    Medical History: Past Medical History:  Diagnosis Date  . Allergy   . Anxiety   . Arthritis   . Asthma   . Bipolar 1 disorder ([place])   . Chest pain   . Depression   . GERD (gastroesophageal reflux disease)   . History of kidney stones   . Hyperlipidemia   . Hypertension   . Insomnia   . Mitral valve regurgitation   . Osteoporosis   . Peripheral neuropathy   . Tricuspid valve regurgitation     Family History: Family History  Problem Relation Age of Onset  . Breast cancer Paternal Aunt        15's  . Heart disease Mother   . Hypertension Mother   . Stroke Mother   . Stroke Father   . Cancer Father   . Heart disease Father   . Diabetes Sister   . Diabetes Brother       Review of Systems  Constitutional: Negative for activity change, chills, fatigue and unexpected weight change.  HENT: Negative for congestion, postnasal drip, rhinorrhea, sneezing and sore throat.   Respiratory: Negative for cough, chest tightness, shortness of breath and wheezing.   Cardiovascular: Negative for chest pain and palpitations.       Elevated blood pressure  Gastrointestinal: Negative for abdominal pain, constipation, diarrhea, nausea and vomiting.  Endocrine: Negative for cold intolerance, heat intolerance, polydipsia and polyuria.  Genitourinary: Negative for dysuria, frequency and urgency.  Musculoskeletal: Negative for arthralgias, back pain, joint swelling and neck pain.  Skin: Negative for rash.   Neurological: Positive for headaches. Negative for tremors and numbness.  Hematological: Negative for adenopathy. Does not bruise/bleed easily.  Psychiatric/Behavioral: Negative for behavioral problems (Depression), sleep disturbance and suicidal ideas. The patient is not nervous/anxious.      Today's Vitals   03/12/19 0945  BP: (!) 154/88  Pulse: 70  Resp: 16  SpO2: 97%  Weight: 140 lb 12.8 oz (63.9 kg)  Height: 5\' 7"  (1.702 m)   Body mass index is 22.05 kg/m.  Physical Exam Vitals signs and nursing note reviewed.  Constitutional:      General: She is not in acute distress.    Appearance: Normal appearance. She is well-developed. She is not diaphoretic.  HENT:     Head: Normocephalic and atraumatic.     Nose: Nose normal.     Mouth/Throat:     Mouth: Mucous membranes are moist.     Pharynx: Oropharynx is clear. No oropharyngeal exudate.  Eyes:  Pupils: Pupils are equal, round, and reactive to light.  Neck:     Musculoskeletal: Normal range of motion and neck supple.     Thyroid: No thyromegaly.     Vascular: No JVD.     Trachea: No tracheal deviation.  Cardiovascular:     Rate and Rhythm: Normal rate and regular rhythm.     Pulses: Normal pulses.     Heart sounds: Normal heart sounds. No murmur. No friction rub. No gallop.   Pulmonary:     Effort: Pulmonary effort is normal. No respiratory distress.     Breath sounds: Normal breath sounds. No wheezing or rales.  Chest:     Chest wall: No tenderness.     Breasts:        Right: Normal. No swelling, bleeding, inverted nipple, mass, nipple discharge, skin change or tenderness.        Left: Normal. No swelling, bleeding, inverted nipple, mass, nipple discharge, skin change or tenderness.  Abdominal:     General: Bowel sounds are normal.     Palpations: Abdomen is soft.     Tenderness: There is no abdominal tenderness.  Musculoskeletal: Normal range of motion.  Lymphadenopathy:     Cervical: No cervical  adenopathy.  Skin:    General: Skin is warm and dry.     Capillary Refill: Capillary refill takes less than 2 seconds.  Neurological:     Mental Status: She is alert and oriented to person, place, and time.     Cranial Nerves: No cranial nerve deficit.  Psychiatric:        Behavior: Behavior normal.        Thought Content: Thought content normal.        Judgment: Judgment normal.    Depression screen [facility] 2/9 03/12/2019 01/15/2019 02/17/2018  Decreased Interest 0 0 2  Down, Depressed, Hopeless 0 0 1  PHQ - 2 Score 0 0 3  Altered sleeping - - 3  Tired, decreased energy - - 1  Change in appetite - - 0  Feeling bad or failure about yourself  - - 1  Trouble concentrating - - 0  Moving slowly or fidgety/restless - - 1  Suicidal thoughts - - 0  PHQ-9 Score - - 9  Difficult doing work/chores - - Somewhat difficult    Functional Status Survey: Is the patient deaf or have difficulty hearing?: Yes(pt needing hearing aides) Does the patient have difficulty seeing, even when wearing glasses/contacts?: No Does the patient have difficulty concentrating, remembering, or making decisions?: Yes Does the patient have difficulty walking or climbing stairs?: Yes Does the patient have difficulty dressing or bathing?: No Does the patient have difficulty doing errands alone such as visiting a doctor's office or shopping?: No  MMSE - [place] Exam 03/12/2019  Orientation to time 5  Orientation to Place 5  Registration 3  Attention/ Calculation 5  Recall 3  Language- name 2 objects 2  Language- repeat 1  Language- follow 3 step command 3  Language- read & follow direction 1  Write a sentence 1  Copy design 1  Total score 30    Fall Risk  03/12/2019 01/15/2019 02/17/2018  Falls in the past year? 1 0 No  Number falls in past yr: 0 - -  Injury with Fall? 0 - -      LABS: Recent Results (from the past 2160 hour(s))  UA/M w/rflx Culture, Routine     Status: Abnormal   Collection  Time: 03/12/19  9:45 AM  Result Value Ref Range   Specific Gravity, UA 1.013 1.005 - 1.030   pH, UA 7.5 5.0 - 7.5   Color, UA Yellow Yellow   Appearance Ur Cloudy (A) Clear   Leukocytes,UA Negative Negative   Protein,UA Negative Negative/Trace   Glucose, UA Negative Negative   Ketones, UA Negative Negative   RBC, UA Negative Negative   Bilirubin, UA Negative Negative   Urobilinogen, Ur 0.2 0.2 - 1.0 mg/dL   Nitrite, UA Negative Negative   Microscopic Examination Comment     Comment: Microscopic follows if indicated.   Microscopic Examination See below:     Comment: Microscopic was indicated and was performed.   Urinalysis Reflex Comment     Comment: This specimen has reflexed to a Urine Culture.  Microscopic Examination     Status: Abnormal   Collection Time: 03/12/19  9:45 AM  Result Value Ref Range   WBC, UA 0-5 0 - 5 /hpf   RBC 0-2 0 - 2 /hpf   Epithelial Cells (non renal) 0-10 0 - 10 /hpf   Casts None seen None seen /lpf   Mucus, UA Present Not Estab.   Bacteria, UA Moderate (A) None seen/Few  Urine Culture, Reflex     Status: Abnormal   Collection Time: 03/12/19  9:45 AM  Result Value Ref Range   Urine Culture, Routine CANCELED (A)     Comment: Test Not Performed. One specimen was submitted with requests for multiple tests. The requested testing requires a separate specimen for each test requested.  Result canceled by the ancillary.   Comprehensive metabolic panel     Status: None   Collection Time: 03/19/19 10:22 AM  Result Value Ref Range   Glucose 92 65 - 99 mg/dL   BUN 11 6 - 24 mg/dL   Creatinine, Ser 0.91 0.57 - 1.00 mg/dL   GFR calc non Af Amer 76 >59 mL/min/1.73   GFR calc Af Amer 88 >59 mL/min/1.73   BUN/Creatinine Ratio 12 9 - 23   Sodium 139 134 - 144 mmol/L   Potassium 4.5 3.5 - 5.2 mmol/L   Chloride 103 96 - 106 mmol/L   CO2 24 20 - 29 mmol/L   Calcium 8.9 8.7 - 10.2 mg/dL   Total Protein 6.4 6.0 - 8.5 g/dL   Albumin 3.9 3.8 - 4.8 g/dL    Globulin, Total 2.5 1.5 - 4.5 g/dL   Albumin/Globulin Ratio 1.6 1.2 - 2.2   Bilirubin Total 0.3 0.0 - 1.2 mg/dL   Alkaline Phosphatase 82 39 - 117 IU/L   AST 14 0 - 40 IU/L   ALT 10 0 - 32 IU/L  CBC     Status: None   Collection Time: 03/19/19 10:22 AM  Result Value Ref Range   WBC 6.4 3.4 - 10.8 x10E3/uL   RBC 4.15 3.77 - 5.28 x10E6/uL   Hemoglobin 12.4 11.1 - 15.9 g/dL   Hematocrit 39.1 34.0 - 46.6 %   MCV 94 79 - 97 fL   MCH 29.9 26.6 - 33.0 pg   MCHC 31.7 31.5 - 35.7 g/dL   RDW 12.5 11.7 - 15.4 %   Platelets 202 150 - 450 x10E3/uL  Lipid Panel w/o Chol/HDL Ratio     Status: None   Collection Time: 03/19/19 10:22 AM  Result Value Ref Range   Cholesterol, Total 164 100 - 199 mg/dL   Triglycerides 135 0 - 149 mg/dL   HDL 45 >39 mg/dL   VLDL Cholesterol Cal  27 5 - 40 mg/dL   LDL Calculated 92 0 - 99 mg/dL  T4, free     Status: None   Collection Time: 03/19/19 10:22 AM  Result Value Ref Range   Free T4 1.13 0.82 - 1.77 ng/dL  TSH     Status: None   Collection Time: 03/19/19 10:22 AM  Result Value Ref Range   TSH 0.686 0.450 - 4.500 uIU/mL  VITAMIN D 25 Hydroxy (Vit-D Deficiency, Fractures)     Status: Abnormal   Collection Time: 03/19/19 10:22 AM  Result Value Ref Range   Vit D, 25-Hydroxy 27.2 (L) 30.0 - 100.0 ng/mL    Comment: Vitamin D deficiency has been defined by the Institute of Medicine and an Endocrine Society practice guideline as a level of serum 25-OH vitamin D less than 20 ng/mL (1,2). The Endocrine Society went on to further define vitamin D insufficiency as a level between 21 and 29 ng/mL (2). 1. IOM (Institute of Medicine). 2010. Dietary reference    intakes for calcium and D. [place]: The    [employer]. 2. Holick MF, Binkley [place], Bischoff-Ferrari HA, et al.    Evaluation, treatment, and prevention of vitamin D    deficiency: an Endocrine Society clinical practice    guideline. JCEM. 2011 Jul; 96(7):1911-30.   H. pylori antibody,  IgG     Status: None   Collection Time: 03/19/19 10:22 AM  Result Value Ref Range   H. pylori, IgG AbS 0.49 0.00 - 0.79 Index Value    Comment:                              Negative           <0.80                              Equivocal    0.80 - 0.89                              Positive           >0.89    Assessment/Plan: 1. Encounter for general adult medical examination with abnormal findings Annual health maintenance exam today.  2. Essential hypertension Increase lisinopril to 20mg  daily. Continue metoprolol as prescribed. Monitor blood pressures closely and adjust medication as indicated.  - metoprolol tartrate (LOPRESSOR) 100 MG tablet; Take 1 tablet po BID  Dispense: 180 tablet; Refill: 1 - lisinopril (ZESTRIL) 20 MG tablet; Take 1 tablet (20 mg total) by mouth daily.  Dispense: 30 tablet; Refill: 3  3. Screening for breast cancer - MM DIGITAL SCREENING BILATERAL; Future  5. Dysuria - UA/M w/rflx Culture, Routine  General Counseling: Chyann verbalizes understanding of the findings of todays visit and agrees with plan of treatment. I have discussed any further diagnostic evaluation that may be needed or ordered today. We also reviewed her medications today. she has been encouraged to call the office with any questions or concerns that should arise related to todays visit.    Counseling:  Hypertension Counseling:   The following hypertensive lifestyle modification were recommended and discussed:  1. Limiting alcohol intake to less than 1 oz/day of ethanol:(24 oz of beer or 8 oz of wine or 2 oz of 100-proof whiskey). 2. Take baby ASA 81 mg daily. 3. Importance of regular aerobic exercise and  losing weight. 4. Reduce dietary saturated fat and cholesterol intake for overall cardiovascular health. 5. Maintaining adequate dietary potassium, calcium, and magnesium intake. 6. Regular monitoring of the blood pressure. 7. Reduce sodium intake to less than 100 mmol/day (less than  2.3 gm of sodium or less than 6 gm of sodium choride)   This patient was seen by [place] with Dr Lavera Guise as a part of collaborative care agreement  Orders Placed This Encounter  Procedures  . Microscopic Examination  . Urine Culture, Reflex  . MM DIGITAL SCREENING BILATERAL  . UA/M w/rflx Culture, Routine    Meds ordered this encounter  Medications  . metoprolol tartrate (LOPRESSOR) 100 MG tablet    Sig: Take 1 tablet po BID    Dispense:  180 tablet    Refill:  1    Dose adjustment    Order Specific Question:   Supervising Provider    Answer:   Lavera Guise [[id number]]  . lisinopril (ZESTRIL) 20 MG tablet    Sig: Take 1 tablet (20 mg total) by mouth daily.    Dispense:  30 tablet    Refill:  3    Please note increased dose    Order Specific Question:   Supervising Provider    Answer:   Lavera Guise [[id number]]    Time spent: [place], MD  Internal Medicine

## 2019-03-16 LAB — UA/M W/RFLX CULTURE, ROUTINE
Bilirubin, UA: NEGATIVE
Glucose, UA: NEGATIVE
Ketones, UA: NEGATIVE
Leukocytes,UA: NEGATIVE
Nitrite, UA: NEGATIVE
Protein,UA: NEGATIVE
RBC, UA: NEGATIVE
Specific Gravity, UA: 1.013 (ref 1.005–1.030)
Urobilinogen, Ur: 0.2 mg/dL (ref 0.2–1.0)
pH, UA: 7.5 (ref 5.0–7.5)

## 2019-03-16 LAB — MICROSCOPIC EXAMINATION: Casts: NONE SEEN /lpf

## 2019-03-16 LAB — URINE CULTURE, REFLEX

## 2019-03-19 ENCOUNTER — Other Ambulatory Visit: Payer: Self-pay | Admitting: Nurse Practitioner

## 2019-03-19 DIAGNOSIS — I10 Essential (primary) hypertension: Secondary | ICD-10-CM | POA: Diagnosis not present

## 2019-03-19 DIAGNOSIS — Z0000 Encounter for general adult medical examination without abnormal findings: Secondary | ICD-10-CM | POA: Diagnosis not present

## 2019-03-19 DIAGNOSIS — E559 Vitamin D deficiency, unspecified: Secondary | ICD-10-CM | POA: Diagnosis not present

## 2019-03-19 DIAGNOSIS — K2950 Unspecified chronic gastritis without bleeding: Secondary | ICD-10-CM | POA: Diagnosis not present

## 2019-03-19 DIAGNOSIS — E782 Mixed hyperlipidemia: Secondary | ICD-10-CM | POA: Diagnosis not present

## 2019-03-23 LAB — CBC
Hematocrit: 39.1 % (ref 34.0–46.6)
Hemoglobin: 12.4 g/dL (ref 11.1–15.9)
MCH: 29.9 pg (ref 26.6–33.0)
MCHC: 31.7 g/dL (ref 31.5–35.7)
MCV: 94 fL (ref 79–97)
Platelets: 202 10*3/uL (ref 150–450)
RBC: 4.15 x10E6/uL (ref 3.77–5.28)
RDW: 12.5 % (ref 11.7–15.4)
WBC: 6.4 10*3/uL (ref 3.4–10.8)

## 2019-03-23 LAB — LIPID PANEL W/O CHOL/HDL RATIO
Cholesterol, Total: 164 mg/dL (ref 100–199)
HDL: 45 mg/dL (ref >39)
LDL Calculated: 92 mg/dL (ref 0–99)
Triglycerides: 135 mg/dL (ref 0–149)
VLDL Cholesterol Cal: 27 mg/dL (ref 5–40)

## 2019-03-23 LAB — COMPREHENSIVE METABOLIC PANEL
ALT: 10 IU/L (ref 0–32)
AST: 14 IU/L (ref 0–40)
Albumin/Globulin Ratio: 1.6 (ref 1.2–2.2)
Albumin: 3.9 g/dL (ref 3.8–4.8)
Alkaline Phosphatase: 82 IU/L (ref 39–117)
BUN/Creatinine Ratio: 12 (ref 9–23)
BUN: 11 mg/dL (ref 6–24)
Bilirubin Total: 0.3 mg/dL (ref 0.0–1.2)
CO2: 24 mmol/L (ref 20–29)
Calcium: 8.9 mg/dL (ref 8.7–10.2)
Chloride: 103 mmol/L (ref 96–106)
Creatinine, Ser: 0.91 mg/dL (ref 0.57–1.00)
GFR calc Af Amer: 88 mL/min/{1.73_m2} (ref >59)
GFR calc non Af Amer: 76 mL/min/{1.73_m2} (ref >59)
Globulin, Total: 2.5 g/dL (ref 1.5–4.5)
Glucose: 92 mg/dL (ref 65–99)
Potassium: 4.5 mmol/L (ref 3.5–5.2)
Sodium: 139 mmol/L (ref 134–144)
Total Protein: 6.4 g/dL (ref 6.0–8.5)

## 2019-03-23 LAB — H. PYLORI ANTIBODY, IGG: H. pylori, IgG AbS: 0.49 Index Value (ref 0.00–0.79)

## 2019-03-23 LAB — TSH: TSH: 0.686 u[IU]/mL (ref 0.450–4.500)

## 2019-03-23 LAB — T4, FREE: Free T4: 1.13 ng/dL (ref 0.82–1.77)

## 2019-03-23 LAB — VITAMIN D 25 HYDROXY (VIT D DEFICIENCY, FRACTURES): Vit D, 25-Hydroxy: 27.2 ng/mL — ABNORMAL LOW (ref 30.0–100.0)

## 2019-03-26 ENCOUNTER — Telehealth: Payer: Self-pay

## 2019-03-29 DIAGNOSIS — R300 Dysuria: Secondary | ICD-10-CM | POA: Insufficient documentation

## 2019-03-29 NOTE — Telephone Encounter (Signed)
Please let her know that her vitamin d was just a little low. I suggest OTC vitamin d supplement 1000 or 2000iu daily. All other labs looked good. Thanks.

## 2019-03-29 NOTE — Telephone Encounter (Signed)
Pt advised we going to send referral for GI  And also her labs look good only vitamin D is low she can take OTC

## 2019-03-29 NOTE — Telephone Encounter (Signed)
Yes. I think it is ok to refer her to GI and please send to beth so we can get this started. Thanks.

## 2019-04-05 ENCOUNTER — Other Ambulatory Visit: Payer: Self-pay

## 2019-04-05 DIAGNOSIS — I10 Essential (primary) hypertension: Secondary | ICD-10-CM

## 2019-04-05 MED ORDER — LISINOPRIL 20 MG PO TABS: 20.0000 mg | ORAL_TABLET | Freq: Every day | ORAL | 3 refills | Status: DC

## 2019-04-06 ENCOUNTER — Encounter: Payer: Self-pay | Admitting: Gastroenterology

## 2019-04-06 ENCOUNTER — Ambulatory Visit (INDEPENDENT_AMBULATORY_CARE_PROVIDER_SITE_OTHER): Payer: Medicare Other | Admitting: Gastroenterology

## 2019-04-06 ENCOUNTER — Other Ambulatory Visit: Payer: Self-pay

## 2019-04-06 VITALS — BP 125/79 | HR 51 | Temp 98.1°F | Ht 67.0 in | Wt 140.4 lb

## 2019-04-06 DIAGNOSIS — K219 Gastro-esophageal reflux disease without esophagitis: Secondary | ICD-10-CM

## 2019-04-06 DIAGNOSIS — R1013 Epigastric pain: Secondary | ICD-10-CM

## 2019-04-06 DIAGNOSIS — G8929 Other chronic pain: Secondary | ICD-10-CM

## 2019-04-06 NOTE — Progress Notes (Signed)
Misty Robbins [address]  [place]  [place], [place] [address]  Main: [phone]  Fax: [phone]   Gastroenterology Consultation  Referring Provider:     Lavera Guise, MD Primary Care Physician:  Lavera Guise, MD Reason for Consultation:    Abdominal pain        HPI:    Chief Complaint  Patient presents with   New Patient (Initial Visit)    ongoing abdominal pain, neg. H. Pylori    Misty Robbins is a 48 y.o. y/o female referred for consultation & management  by Dr. Humphrey Rolls, Timoteo Gaul, MD.  Patient reports midepigastric abdominal pain ongoing intermittently for 1 to 3 years.  Does not exacerbate with meals.  Does worsen with alcohol.  Dull,  Nonradiating, 8/10.  Last 30-60 minutes.  Denies any dysphagia.  H. pylori IgG EGD done by PCP recently was negative.  States she takes Tums, about 6-7 times and the pain eventually subsides.  This is in addition to the daily Dexilant that she is taking.  Was previously on Protonix and this did not help either.  Denies heartburn.  Also reports history of C. difficile 6 years ago and since then has had 5-6 loose bowel movements a day without blood.  Patient was evaluated with Dr. Allen Norris last year, June 2019 for the same symptoms and EGD and colonoscopy were essentially unrevealing.  Gastric erythema biopsies on the EGD did not show H. pylori.  7 mm cecal polyp showed sessile serrated polyp.  Colon biopsies did not show microscopic colitis.  Terminal ileum was normal.  Repeat colonoscopy recommended in 5 years.  Esophagram in May 2019 showed moderate severity gastroesophageal reflux, otherwise normal  Past Medical History:  Diagnosis Date   Allergy    Anxiety    Arthritis    Asthma    Bipolar 1 disorder (HCC)    Chest pain    Depression    GERD (gastroesophageal reflux disease)    History of kidney stones    Hyperlipidemia    Hypertension    Insomnia    Mitral valve regurgitation    Osteoporosis     Peripheral neuropathy    Tricuspid valve regurgitation     Past Surgical History:  Procedure Laterality Date   ABDOMINAL HYSTERECTOMY  2000   APPENDECTOMY     BACK SURGERY     CERVICAL FUSION  2011,2012   c2-7   COLONOSCOPY WITH PROPOFOL N/A 04/13/2018   Procedure: COLONOSCOPY WITH PROPOFOL;  Surgeon: Lucilla Lame, MD;  Location: [place];  Service: Endoscopy;  Laterality: N/A;   ESOPHAGOGASTRODUODENOSCOPY (EGD) WITH PROPOFOL N/A 04/13/2018   Procedure: ESOPHAGOGASTRODUODENOSCOPY (EGD) WITH PROPOFOL;  Surgeon: Lucilla Lame, MD;  Location: [place];  Service: Endoscopy;  Laterality: N/A;   LAPAROSCOPY N/A 05/25/2018   Procedure: LAPAROSCOPY DIAGNOSTIC;  Surgeon: Ouida Sills [name], MD;  Location: [facility];  Service: Gynecology;  Laterality: N/A;   LYSIS OF ADHESION N/A 05/25/2018   Procedure: LYSIS OF ADHESION;  Surgeon: Schermerhorn, [name], MD;  Location: [facility];  Service: Gynecology;  Laterality: N/A;   POLYPECTOMY  04/13/2018   Procedure: POLYPECTOMY;  Surgeon: Lucilla Lame, MD;  Location: [facility];  Service: Endoscopy;;   SKIN GRAFT  1981   from right thigh to bilateral ankles and right knee   TONSILLECTOMY  2013   TRACHELECTOMY N/A 05/25/2018   Procedure: TRACHELECTOMY;  Surgeon: Schermerhorn, [name], MD;  Location: [facility];  Service: Gynecology;  Laterality:  N/A;    Prior to Admission medications   Medication Sig Start Date End Date Taking? Authorizing Provider  albuterol (PROVENTIL HFA;VENTOLIN HFA) 108 (90 Base) MCG/ACT inhaler Inhale into the lungs every 6 (six) hours as needed for wheezing or shortness of breath.   Yes [provider]  DEXILANT 60 MG capsule TAKE 1 CAPSULE(60 MG) BY MOUTH DAILY 02/22/19  Yes Boscia, Heather E, NP  estrogens, conjugated, (PREMARIN) 1.25 MG tablet Take 1 tablet (1.25 mg total) by mouth daily. 01/21/19  Yes Ronnell Freshwater, NP  etodolac (LODINE) 500 MG tablet Take one tablet by mouth  twice daily as needed for pain 09/28/18  Yes Scarboro, Audie Clear, NP  ibuprofen (ADVIL) 800 MG tablet Take 1 tablet by mouth as needed. 05/14/18  Yes [provider]  lisinopril (ZESTRIL) 20 MG tablet Take 1 tablet (20 mg total) by mouth daily. 04/05/19  Yes Ronnell Freshwater, NP  metoprolol tartrate (LOPRESSOR) 100 MG tablet Take 1 tablet po BID 03/12/19  Yes Boscia, Heather E, NP  oxybutynin (DITROPAN-XL) 10 MG 24 hr tablet Take 10 mg by mouth daily.    Yes [provider]  pravastatin (PRAVACHOL) 40 MG tablet TAKE 1 TABLET(40 MG) BY MOUTH DAILY 01/21/19  Yes Boscia, Heather E, NP  traZODone (DESYREL) 100 MG tablet TAKE 1 TABLET(100 MG) BY MOUTH AT BEDTIME 03/05/19  Yes Boscia, Heather E, NP  Calcium Carb-Cholecalciferol (CALCIUM 600+D) 600-800 MG-UNIT TABS Take 1 tablet by mouth daily.    [provider]  Cetirizine HCl 10 MG CAPS Take 1 tablet (10MG ) by mouth daily Patient not taking: Reported on 04/06/2019 03/04/18   Ronnell Freshwater, NP    Family History  Problem Relation Age of Onset   Breast cancer Paternal Aunt        17's   Heart disease Mother    Hypertension Mother    Stroke Mother    Stroke Father    Cancer Father    Heart disease Father    Diabetes Sister    Diabetes Brother      Social History   Tobacco Use   Smoking status: Former Smoker    Packs/day: 0.50    Years: 25.00    Pack years: 12.50    Types: Cigarettes   Smokeless tobacco: Never Used   Tobacco comment: not since september 30  Substance Use Topics   Alcohol use: Not Currently   Drug use: No    Allergies as of 04/06/2019 - Review Complete 04/06/2019  Allergen Reaction Noted   Caffeine  07/06/2014   Hydrocodone Nausea And Vomiting 07/06/2014   Pseudoephedrine Other (See Comments) 07/06/2014    Review of Systems:    All systems reviewed and negative except where noted in HPI.   Physical Exam:  BP 125/79    Pulse (!) 51    Temp 98.1 F (36.7 C) (Oral)    Ht 5\' 7"   (1.702 m)    Wt 140 lb 6.4 oz (63.7 kg)    BMI 21.99 kg/m  No LMP recorded. Patient has had a hysterectomy. Psych:  Alert and cooperative. Normal mood and affect. General:   Alert,  Well-developed, well-nourished, pleasant and cooperative in NAD Head:  Normocephalic and atraumatic. Eyes:  Sclera clear, no icterus.   Conjunctiva pink. Ears:  Normal auditory acuity. Nose:  No deformity, discharge, or lesions. Mouth:  No deformity or lesions,oropharynx pink & moist. Neck:  Supple; no masses or thyromegaly. Abdomen:  Normal bowel sounds.  No bruits.  Soft, non-tender and non-distended without masses, hepatosplenomegaly or hernias noted.  No guarding or rebound tenderness.    Msk:  Symmetrical without gross deformities. Good, equal movement & strength bilaterally. Pulses:  Normal pulses noted. Extremities:  No clubbing or edema.  No cyanosis. Neurologic:  Alert and oriented x3;  grossly normal neurologically. Skin:  Intact without significant lesions or rashes. No jaundice. Lymph Nodes:  No significant cervical adenopathy. Psych:  Alert and cooperative. Normal mood and affect.   Labs: CBC    Component Value Date/Time   WBC 6.4 03/19/2019 1022   WBC 7.6 05/18/2018 1114   RBC 4.15 03/19/2019 1022   RBC 4.31 05/18/2018 1114   HGB 12.4 03/19/2019 1022   HCT 39.1 03/19/2019 1022   PLT 202 03/19/2019 1022   MCV 94 03/19/2019 1022   MCV 88 10/09/2012 2040   MCH 29.9 03/19/2019 1022   MCH 31.1 05/18/2018 1114   MCHC 31.7 03/19/2019 1022   MCHC 33.9 05/18/2018 1114   RDW 12.5 03/19/2019 1022   RDW 13.7 10/09/2012 2040   LYMPHSABS 3.0 12/07/2015 1304   MONOABS 0.5 12/07/2015 1304   EOSABS 0.3 12/07/2015 1304   BASOSABS 0.0 12/07/2015 1304   CMP     Component Value Date/Time   NA 139 03/19/2019 1022   NA 140 10/09/2012 2040   K 4.5 03/19/2019 1022   K 3.9 10/09/2012 2040   CL 103 03/19/2019 1022   CL 107 10/09/2012 2040   CO2 24 03/19/2019 1022   CO2 27 10/09/2012 2040    GLUCOSE 92 03/19/2019 1022   GLUCOSE 79 05/18/2018 1114   GLUCOSE 176 (H) 10/09/2012 2040   BUN 11 03/19/2019 1022   BUN 6 (L) 10/09/2012 2040   CREATININE 0.91 03/19/2019 1022   CREATININE 0.92 10/09/2012 2040   CALCIUM 8.9 03/19/2019 1022   CALCIUM 8.6 10/09/2012 2040   PROT 6.4 03/19/2019 1022   PROT 7.2 10/09/2012 2040   ALBUMIN 3.9 03/19/2019 1022   ALBUMIN 3.4 10/09/2012 2040   AST 14 03/19/2019 1022   AST 20 10/09/2012 2040   ALT 10 03/19/2019 1022   ALT 16 10/09/2012 2040   ALKPHOS 82 03/19/2019 1022   ALKPHOS 123 10/09/2012 2040   BILITOT 0.3 03/19/2019 1022   BILITOT 0.4 10/09/2012 2040   GFRNONAA 76 03/19/2019 1022   GFRNONAA >60 10/09/2012 2040   GFRAA 88 03/19/2019 1022   GFRAA >60 10/09/2012 2040    Imaging Studies: No results found.  Assessment and Plan:   Misty Robbins is a 48 y.o. y/o female has been referred for abdominal pain  During previous evaluation with Dr. Allen Norris, the plan was to proceed with a pH monitoring study if her endoscopic procedures were unrevealing.  At this time, since the symptoms have been same for 1 to 2 years, there is no indication for repeat upper or lower endoscopy as it is unlikely to show any new diagnoses or lead to changes in management.  Instead, pH monitoring is indicated at this time to evaluate if reflux is a cause of her symptoms and if it is acidic reflux.  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

## 2019-04-06 NOTE — Addendum Note (Signed)
Addended by: Earl Lagos on: 04/06/2019 02:23 PM   Modules accepted: Orders

## 2019-04-06 NOTE — Patient Instructions (Signed)
Right upper ultrasound on 04/09/2019 at [facility]. 8:15 arrival time at registration desk at the medical mall, ultrasound at 8:30 am. Nothing to eat or drink after midnight. Will send referral to [facility] for ph impedance study and they will contact you.

## 2019-04-09 ENCOUNTER — Other Ambulatory Visit: Payer: Self-pay

## 2019-04-09 ENCOUNTER — Ambulatory Visit
Admission: RE | Admit: 2019-04-09 | Discharge: 2019-04-09 | Disposition: A | Discharge status: Home / Self Care | Payer: Medicare Other | Source: Ambulatory Visit | Attending: Gastroenterology | Admitting: Gastroenterology

## 2019-04-09 DIAGNOSIS — D1803 Hemangioma of intra-abdominal structures: Secondary | ICD-10-CM | POA: Diagnosis not present

## 2019-04-09 DIAGNOSIS — R1013 Epigastric pain: Secondary | ICD-10-CM | POA: Diagnosis not present

## 2019-04-09 DIAGNOSIS — G8929 Other chronic pain: Secondary | ICD-10-CM

## 2019-04-21 ENCOUNTER — Other Ambulatory Visit: Payer: Self-pay

## 2019-04-21 MED ORDER — ESTROGENS CONJUGATED 1.25 MG PO TABS: 1.2500 mg | ORAL_TABLET | Freq: Every day | ORAL | 3 refills | Status: DC

## 2019-04-23 ENCOUNTER — Ambulatory Visit (INDEPENDENT_AMBULATORY_CARE_PROVIDER_SITE_OTHER): Payer: Medicare Other | Admitting: Nurse Practitioner

## 2019-04-23 ENCOUNTER — Encounter: Payer: Self-pay | Admitting: Nurse Practitioner

## 2019-04-23 ENCOUNTER — Other Ambulatory Visit: Payer: Self-pay

## 2019-04-23 VITALS — BP 112/64 | HR 74 | Ht 67.0 in | Wt 141.2 lb

## 2019-04-23 DIAGNOSIS — M545 Low back pain, unspecified: Secondary | ICD-10-CM

## 2019-04-23 DIAGNOSIS — I10 Essential (primary) hypertension: Secondary | ICD-10-CM | POA: Diagnosis not present

## 2019-04-23 DIAGNOSIS — K219 Gastro-esophageal reflux disease without esophagitis: Secondary | ICD-10-CM

## 2019-04-23 MED ORDER — PREDNISONE 10 MG (21) PO TBPK: ORAL_TABLET | ORAL | 0 refills | Status: DC

## 2019-04-23 NOTE — Progress Notes (Signed)
[facility] [place], [place] [address]  Internal MEDICINE  Telephone Visit  Patient Name: Misty Robbins  [id number]  [id number]  Date of Service: 04/28/2019  I connected with the patient at 12:12pm by webcam and verified the patients identity using two identifiers.   I discussed the limitations, risks, security and privacy concerns of performing an evaluation and management service by webcam and the availability of in person appointments. I also discussed with the patient that there may be a patient responsible charge related to the service.  The patient expressed understanding and agrees to proceed.    Chief Complaint  Patient presents with  . Telephone Screen    VIDEO VISIT [phone]  . Telephone Assessment  . Medical Management of Chronic Issues    6wk follow up   . Hypertension    increased lisinopril    The patient has been contacted via webcam for follow up visit due to concerns for spread of novel coronavirus.  She is following up regarding her blood pressure. Lisinopril was increased at most recent visit. Blood pressure is improved and she is tolerating this change well. Today, she states that she is having severe low back pain. Has bulging discs in lumbar spine and with exertion, this can cause moderate to severe pain. She states that prednisone taper helps her back more than anything.       Current Medication: Outpatient Encounter Medications as of 04/23/2019  Medication Sig  . albuterol (PROVENTIL HFA;VENTOLIN HFA) 108 (90 Base) MCG/ACT inhaler Inhale into the lungs every 6 (six) hours as needed for wheezing or shortness of breath.  . Calcium Carb-Cholecalciferol (CALCIUM 600+D) 600-800 MG-UNIT TABS Take 1 tablet by mouth daily.  Marland Kitchen DEXILANT 60 MG capsule TAKE 1 CAPSULE(60 MG) BY MOUTH DAILY  . estrogens, conjugated, (PREMARIN) 1.25 MG tablet Take 1 tablet (1.25 mg total) by mouth daily.  Marland Kitchen etodolac (LODINE) 500 MG tablet Take one tablet by  mouth twice daily as needed for pain  . ibuprofen (ADVIL) 800 MG tablet Take 1 tablet by mouth as needed.  Marland Kitchen lisinopril (ZESTRIL) 20 MG tablet Take 1 tablet (20 mg total) by mouth daily.  . metoprolol tartrate (LOPRESSOR) 100 MG tablet Take 1 tablet po BID  . oxybutynin (DITROPAN-XL) 10 MG 24 hr tablet Take 10 mg by mouth daily.   . pravastatin (PRAVACHOL) 40 MG tablet TAKE 1 TABLET(40 MG) BY MOUTH DAILY  . traZODone (DESYREL) 100 MG tablet TAKE 1 TABLET(100 MG) BY MOUTH AT BEDTIME  . predniSONE (STERAPRED UNI-PAK 21 TAB) 10 MG (21) TBPK tablet 6 day taper - take by mouth as directed for 6 days  . [DISCONTINUED] Cetirizine HCl 10 MG CAPS Take 1 tablet (10MG ) by mouth daily (Patient not taking: Reported on 04/06/2019)   No facility-administered encounter medications on file as of 04/23/2019.     Surgical History: Past Surgical History:  Procedure Laterality Date  . ABDOMINAL HYSTERECTOMY  2000  . APPENDECTOMY    . BACK SURGERY    . CERVICAL FUSION  [id number]   c2-7  . COLONOSCOPY WITH PROPOFOL N/A 04/13/2018   Procedure: COLONOSCOPY WITH PROPOFOL;  Surgeon: Lucilla Lame, MD;  Location: [place];  Service: Endoscopy;  Laterality: N/A;  . ESOPHAGOGASTRODUODENOSCOPY (EGD) WITH PROPOFOL N/A 04/13/2018   Procedure: ESOPHAGOGASTRODUODENOSCOPY (EGD) WITH PROPOFOL;  Surgeon: Lucilla Lame, MD;  Location: [place];  Service: Endoscopy;  Laterality: N/A;  . LAPAROSCOPY N/A 05/25/2018   Procedure: LAPAROSCOPY DIAGNOSTIC;  Surgeon: Laverta Baltimore  J, MD;  Location: [facility];  Service: Gynecology;  Laterality: N/A;  . LYSIS OF ADHESION N/A 05/25/2018   Procedure: LYSIS OF ADHESION;  Surgeon: Ouida Sills, [name], MD;  Location: [facility];  Service: Gynecology;  Laterality: N/A;  . POLYPECTOMY  04/13/2018   Procedure: POLYPECTOMY;  Surgeon: Lucilla Lame, MD;  Location: [place];  Service: Endoscopy;;  . SKIN GRAFT  1981   from right thigh to bilateral ankles and right  knee  . TONSILLECTOMY  2013  . TRACHELECTOMY N/A 05/25/2018   Procedure: TRACHELECTOMY;  Surgeon: Schermerhorn, [name], MD;  Location: [facility];  Service: Gynecology;  Laterality: N/A;    Medical History: Past Medical History:  Diagnosis Date  . Allergy   . Anxiety   . Arthritis   . Asthma   . Bipolar 1 disorder ([place])   . Chest pain   . Depression   . GERD (gastroesophageal reflux disease)   . History of kidney stones   . Hyperlipidemia   . Hypertension   . Insomnia   . Mitral valve regurgitation   . Osteoporosis   . Peripheral neuropathy   . Tricuspid valve regurgitation     Family History: Family History  Problem Relation Age of Onset  . Breast cancer Paternal Aunt        27's  . Heart disease Mother   . Hypertension Mother   . Stroke Mother   . Stroke Father   . Cancer Father   . Heart disease Father   . Diabetes Sister   . Diabetes Brother     Social History   Socioeconomic History  . Marital status: Married    Spouse name: Not on file  . Number of children: Not on file  . Years of education: Not on file  . Highest education level: Not on file  Occupational History  . Not on file  Social Needs  . Financial resource strain: Not on file  . Food insecurity    Worry: Not on file    Inability: Not on file  . Transportation needs    Medical: Not on file    Non-medical: Not on file  Tobacco Use  . Smoking status: Former Smoker    Packs/day: 0.50    Years: 25.00    Pack years: 12.50    Types: Cigarettes  . Smokeless tobacco: Never Used  . Tobacco comment: not since september 30  Substance and Sexual Activity  . Alcohol use: Not Currently  . Drug use: No  . Sexual activity: Not on file  Lifestyle  . Physical activity    Days per week: Not on file    Minutes per session: Not on file  . Stress: Not on file  Relationships  . Social Herbalist on phone: Not on file    Gets together: Not on file    Attends religious service: Not on  file    Active member of club or organization: Not on file    Attends meetings of clubs or organizations: Not on file    Relationship status: Not on file  . Intimate partner violence    Fear of current or ex partner: Not on file    Emotionally abused: Not on file    Physically abused: Not on file    Forced sexual activity: Not on file  Other Topics Concern  . Not on file  Social History Narrative  . Not on file      Review of  Systems  Constitutional: Negative for activity change, chills, fatigue and unexpected weight change.  HENT: Negative for congestion, postnasal drip, rhinorrhea, sneezing and sore throat.   Respiratory: Negative for cough, chest tightness, shortness of breath and wheezing.   Cardiovascular: Negative for chest pain and palpitations.       Improved blood pressure  Gastrointestinal: Positive for abdominal distention and abdominal pain. Negative for constipation, diarrhea, nausea and vomiting.       Patient now seeing GI provider  Endocrine: Negative for cold intolerance, heat intolerance, polydipsia and polyuria.  Musculoskeletal: Positive for back pain and myalgias. Negative for arthralgias, joint swelling and neck pain.  Skin: Negative for rash.  Neurological: Positive for headaches. Negative for tremors and numbness.  Hematological: Negative for adenopathy. Does not bruise/bleed easily.  Psychiatric/Behavioral: Negative for behavioral problems (Depression), sleep disturbance and suicidal ideas. The patient is not nervous/anxious.     Today's Vitals   04/23/19 1150  BP: 112/64  Pulse: 74  Weight: 141 lb 3.2 oz (64 kg)  Height: 5\' 7"  (1.702 m)   Body mass index is 22.12 kg/m.  Observation/Objective:    The patient is alert and oriented. She is pleasant and answers all questions appropriately. Breathing is non-labored. She is in no acute distress at this time.    Assessment/Plan:  1. Essential hypertension Much better. Continue lisinopril and  metoprolol as prescribed   2. Acute midline low back pain without sciatica Prednisone 10mg  dose pack. Take as directed for 6 days. Rest and apply heat to the back as needed.  - predniSONE (STERAPRED UNI-PAK 21 TAB) 10 MG (21) TBPK tablet; 6 day taper - take by mouth as directed for 6 days  Dispense: 21 tablet; Refill: 0  3. Gastroesophageal reflux disease without esophagitis Continue regular visits with GI provider .  General Counseling: Decie verbalizes understanding of the findings of today's phone visit and agrees with plan of treatment. I have discussed any further diagnostic evaluation that may be needed or ordered today. We also reviewed her medications today. she has been encouraged to call the office with any questions or concerns that should arise related to todays visit.  Hypertension Counseling:   The following hypertensive lifestyle modification were recommended and discussed:  1. Limiting alcohol intake to less than 1 oz/day of ethanol:(24 oz of beer or 8 oz of wine or 2 oz of 100-proof whiskey). 2. Take baby ASA 81 mg daily. 3. Importance of regular aerobic exercise and losing weight. 4. Reduce dietary saturated fat and cholesterol intake for overall cardiovascular health. 5. Maintaining adequate dietary potassium, calcium, and magnesium intake. 6. Regular monitoring of the blood pressure. 7. Reduce sodium intake to less than 100 mmol/day (less than 2.3 gm of sodium or less than 6 gm of sodium choride)   This patient was seen by [place] with Dr Lavera Guise as a part of collaborative care agreement  Meds ordered this encounter  Medications  . predniSONE (STERAPRED UNI-PAK 21 TAB) 10 MG (21) TBPK tablet    Sig: 6 day taper - take by mouth as directed for 6 days    Dispense:  21 tablet    Refill:  0    Order Specific Question:   Supervising Provider    Answer:   Lavera Guise [[id number]]    Time spent: 51 Minutes    Dr Lavera Guise Internal  medicine

## 2019-04-26 ENCOUNTER — Other Ambulatory Visit: Payer: Self-pay

## 2019-04-28 DIAGNOSIS — M545 Low back pain, unspecified: Secondary | ICD-10-CM | POA: Insufficient documentation

## 2019-05-05 ENCOUNTER — Other Ambulatory Visit: Payer: Self-pay | Admitting: Nurse Practitioner

## 2019-05-05 MED ORDER — TRAZODONE HCL 100 MG PO TABS: ORAL_TABLET | ORAL | 1 refills | Status: DC

## 2019-05-21 ENCOUNTER — Other Ambulatory Visit: Payer: Self-pay

## 2019-05-21 MED ORDER — DEXILANT 60 MG PO CPDR: DELAYED_RELEASE_CAPSULE | ORAL | 3 refills | Status: DC

## 2019-06-11 ENCOUNTER — Telehealth: Payer: Self-pay

## 2019-06-11 ENCOUNTER — Other Ambulatory Visit: Payer: Self-pay | Admitting: Nurse Practitioner

## 2019-06-11 DIAGNOSIS — M545 Low back pain, unspecified: Secondary | ICD-10-CM

## 2019-06-11 MED ORDER — PREDNISONE 10 MG (21) PO TBPK: ORAL_TABLET | ORAL | 0 refills | Status: DC

## 2019-06-11 NOTE — Telephone Encounter (Signed)
Pt was informed.

## 2019-06-11 NOTE — Progress Notes (Signed)
Sent in prescription for prednisone six day taper. Take as directed for flare of sciatica.

## 2019-06-11 NOTE — Telephone Encounter (Signed)
Sent in prescription for prednisone six day taper. Take as directed for flare of sciatica.

## 2019-06-14 DIAGNOSIS — Z1159 Encounter for screening for other viral diseases: Secondary | ICD-10-CM | POA: Diagnosis not present

## 2019-06-16 ENCOUNTER — Telehealth: Payer: Self-pay | Admitting: Gastroenterology

## 2019-06-16 NOTE — Telephone Encounter (Signed)
Freda Munro with [facility] called to let us know that she was a no show for esophogeal manometry & PH Impedance.

## 2019-06-17 ENCOUNTER — Other Ambulatory Visit: Payer: Self-pay

## 2019-06-17 DIAGNOSIS — I10 Essential (primary) hypertension: Secondary | ICD-10-CM

## 2019-06-17 MED ORDER — METOPROLOL TARTRATE 100 MG PO TABS: ORAL_TABLET | ORAL | 1 refills | Status: DC

## 2019-06-23 ENCOUNTER — Other Ambulatory Visit: Payer: Self-pay

## 2019-06-23 ENCOUNTER — Ambulatory Visit (INDEPENDENT_AMBULATORY_CARE_PROVIDER_SITE_OTHER): Payer: Medicare Other | Admitting: Adult Health

## 2019-06-23 ENCOUNTER — Encounter: Payer: Self-pay | Admitting: Adult Health

## 2019-06-23 VITALS — BP 131/84 | HR 59 | Temp 98.2°F

## 2019-06-23 DIAGNOSIS — M150 Primary generalized (osteo)arthritis: Secondary | ICD-10-CM

## 2019-06-23 DIAGNOSIS — M159 Polyosteoarthritis, unspecified: Secondary | ICD-10-CM

## 2019-06-23 DIAGNOSIS — M545 Low back pain, unspecified: Secondary | ICD-10-CM

## 2019-06-23 DIAGNOSIS — I10 Essential (primary) hypertension: Secondary | ICD-10-CM | POA: Diagnosis not present

## 2019-06-23 MED ORDER — OXYCODONE-ACETAMINOPHEN 5-325 MG PO TABS: 2.0000 | ORAL_TABLET | Freq: Four times a day (QID) | ORAL | 0 refills | Status: DC | PRN

## 2019-06-23 MED ORDER — PREDNISONE 10 MG PO TABS: ORAL_TABLET | ORAL | 0 refills | Status: DC

## 2019-06-23 NOTE — Progress Notes (Signed)
[facility] [place], [place] [address]  Internal MEDICINE  Telephone Visit  Patient Name: Misty Robbins  [id number]  [id number]  Date of Service: 06/23/2019  I connected with the patient at 934 by telephone and verified the patients identity using two identifiers.   I discussed the limitations, risks, security and privacy concerns of performing an evaluation and management service by telephone and the availability of in person appointments. I also discussed with the patient that there may be a patient responsible charge related to the service.  The patient expressed understanding and agrees to proceed.    Chief Complaint  Patient presents with  . Telephone Screen  . Back Pain    finished prednisone it had helped , and it is very painful   . Telephone Assessment    HPI  PT seen via video.  She is complaining of lumbar spine pain. She reports the flare up started about 3 weeks ago.  She took a prednisone dose pack for 6 days, and had some relief.  However, it has restarted.  She would like to try more prednisone at this time.  She reports in the past it is the only thing that has helped. She is having difficulty sleeping at night due to the pain. She denies any other symptoms, but does report the pain radiates into her glutes and hips.   Current Medication: Outpatient Encounter Medications as of 06/23/2019  Medication Sig  . albuterol (PROVENTIL HFA;VENTOLIN HFA) 108 (90 Base) MCG/ACT inhaler Inhale into the lungs every 6 (six) hours as needed for wheezing or shortness of breath.  . Calcium Carb-Cholecalciferol (CALCIUM 600+D) 600-800 MG-UNIT TABS Take 1 tablet by mouth daily.  Marland Kitchen dexlansoprazole (DEXILANT) 60 MG capsule +  TAKE 1 CAPSULE(60 MG) BY MOUTH DAILY  . estrogens, conjugated, (PREMARIN) 1.25 MG tablet Take 1 tablet (1.25 mg total) by mouth daily.  Marland Kitchen etodolac (LODINE) 500 MG tablet Take one tablet by mouth twice daily as needed for pain  . ibuprofen  (ADVIL) 800 MG tablet Take 1 tablet by mouth as needed.  Marland Kitchen lisinopril (ZESTRIL) 20 MG tablet Take 1 tablet (20 mg total) by mouth daily.  . metoprolol tartrate (LOPRESSOR) 100 MG tablet Take 1 tablet po BID  . oxybutynin (DITROPAN-XL) 10 MG 24 hr tablet Take 10 mg by mouth daily.   . pravastatin (PRAVACHOL) 40 MG tablet TAKE 1 TABLET(40 MG) BY MOUTH DAILY  . traZODone (DESYREL) 100 MG tablet TAKE 1 TABLET(100 MG) BY MOUTH AT BEDTIME  . [DISCONTINUED] predniSONE (STERAPRED UNI-PAK 21 TAB) 10 MG (21) TBPK tablet 6 day taper - take by mouth as directed for 6 days (Patient not taking: Reported on 06/23/2019)   No facility-administered encounter medications on file as of 06/23/2019.     Surgical History: Past Surgical History:  Procedure Laterality Date  . ABDOMINAL HYSTERECTOMY  2000  . APPENDECTOMY    . BACK SURGERY    . CERVICAL FUSION  [id number]   c2-7  . COLONOSCOPY WITH PROPOFOL N/A 04/13/2018   Procedure: COLONOSCOPY WITH PROPOFOL;  Surgeon: Lucilla Lame, MD;  Location: [facility];  Service: Endoscopy;  Laterality: N/A;  . ESOPHAGOGASTRODUODENOSCOPY (EGD) WITH PROPOFOL N/A 04/13/2018   Procedure: ESOPHAGOGASTRODUODENOSCOPY (EGD) WITH PROPOFOL;  Surgeon: Lucilla Lame, MD;  Location: [facility];  Service: Endoscopy;  Laterality: N/A;  . LAPAROSCOPY N/A 05/25/2018   Procedure: LAPAROSCOPY DIAGNOSTIC;  Surgeon: Ouida Sills [name], MD;  Location: [facility];  Service: Gynecology;  Laterality: N/A;  .  LYSIS OF ADHESION N/A 05/25/2018   Procedure: LYSIS OF ADHESION;  Surgeon: Schermerhorn, [name], MD;  Location: [facility];  Service: Gynecology;  Laterality: N/A;  . POLYPECTOMY  04/13/2018   Procedure: POLYPECTOMY;  Surgeon: Lucilla Lame, MD;  Location: [facility];  Service: Endoscopy;;  . SKIN GRAFT  1981   from right thigh to bilateral ankles and right knee  . TONSILLECTOMY  2013  . TRACHELECTOMY N/A 05/25/2018   Procedure: TRACHELECTOMY;  Surgeon: Schermerhorn,  [name], MD;  Location: [facility];  Service: Gynecology;  Laterality: N/A;    Medical History: Past Medical History:  Diagnosis Date  . Allergy   . Anxiety   . Arthritis   . Asthma   . Bipolar 1 disorder ([place])   . Chest pain   . Depression   . GERD (gastroesophageal reflux disease)   . History of kidney stones   . Hyperlipidemia   . Hypertension   . Insomnia   . Mitral valve regurgitation   . Osteoporosis   . Peripheral neuropathy   . Tricuspid valve regurgitation     Family History: Family History  Problem Relation Age of Onset  . Breast cancer Paternal Aunt        26's  . Heart disease Mother   . Hypertension Mother   . Stroke Mother   . Stroke Father   . Cancer Father   . Heart disease Father   . Diabetes Sister   . Diabetes Brother     Social History   Socioeconomic History  . Marital status: Married    Spouse name: Not on file  . Number of children: Not on file  . Years of education: Not on file  . Highest education level: Not on file  Occupational History  . Not on file  Social Needs  . Financial resource strain: Not on file  . Food insecurity    Worry: Not on file    Inability: Not on file  . Transportation needs    Medical: Not on file    Non-medical: Not on file  Tobacco Use  . Smoking status: Former Smoker    Packs/day: 0.50    Years: 25.00    Pack years: 12.50    Types: Cigarettes  . Smokeless tobacco: Never Used  . Tobacco comment: not since september 30  Substance and Sexual Activity  . Alcohol use: Not Currently  . Drug use: No  . Sexual activity: Not on file  Lifestyle  . Physical activity    Days per week: Not on file    Minutes per session: Not on file  . Stress: Not on file  Relationships  . Social Herbalist on phone: Not on file    Gets together: Not on file    Attends religious service: Not on file    Active member of club or organization: Not on file    Attends meetings of clubs or organizations: Not on  file    Relationship status: Not on file  . Intimate partner violence    Fear of current or ex partner: Not on file    Emotionally abused: Not on file    Physically abused: Not on file    Forced sexual activity: Not on file  Other Topics Concern  . Not on file  Social History Narrative  . Not on file      Review of Systems  Constitutional: Negative for chills, fatigue and unexpected weight change.  HENT: Negative  for congestion, rhinorrhea, sneezing and sore throat.   Eyes: Negative for photophobia, pain and redness.  Respiratory: Negative for cough, chest tightness and shortness of breath.   Cardiovascular: Negative for chest pain and palpitations.  Gastrointestinal: Negative for abdominal pain, constipation, diarrhea, nausea and vomiting.  Endocrine: Negative.   Genitourinary: Negative for dysuria and frequency.  Musculoskeletal: Positive for back pain. Negative for arthralgias, joint swelling and neck pain.  Skin: Negative for rash.  Allergic/Immunologic: Negative.   Neurological: Negative for tremors and numbness.  Hematological: Negative for adenopathy. Does not bruise/bleed easily.  Psychiatric/Behavioral: Negative for behavioral problems and sleep disturbance. The patient is not nervous/anxious.     Vital Signs: BP 131/84   Pulse (!) 59   Temp 98.2 F (36.8 C)    Observation/Objective:  Well appearing, NAD noted.    Assessment/Plan: 1. Acute midline low back pain without sciatica Reviewed risks and possible side effects associated with taking opiates, benzodiazepines and other CNS depressants. Combination of these could cause dizziness and drowsiness. Advised patient not to drive or operate machinery when taking these medications, as patient's and other's life can be at risk and will have consequences. Patient verbalized understanding in this matter. Dependence and abuse for these drugs will be monitored closely. A Controlled substance policy and procedure is on  file which allows [facility] to order a urine drug screen test at any visit. Patient understands and agrees with the plan - oxyCODONE-acetaminophen (PERCOCET) 5-325 MG tablet; Take 2 tablets by mouth every 6 (six) hours as needed for severe pain.  Dispense: 10 tablet; Refill: 0  2. Primary osteoarthritis involving multiple joints Take 12 day prednisone taper, and use percocet as needed for pain relief until prednisone can work.  - Ambulatory referral to Orthopedic Surgery - predniSONE (DELTASONE) 10 MG tablet; Use per dose pack  Dispense: 42 tablet; Refill: 0  3. Essential hypertension Stable, continue present management.   General Counseling: Henretta verbalizes understanding of the findings of today's phone visit and agrees with plan of treatment. I have discussed any further diagnostic evaluation that may be needed or ordered today. We also reviewed her medications today. she has been encouraged to call the office with any questions or concerns that should arise related to todays visit.    No orders of the defined types were placed in this encounter.   No orders of the defined types were placed in this encounter.   Time spent: [place] AGNP-C Internal medicine

## 2019-06-25 ENCOUNTER — Other Ambulatory Visit: Payer: Self-pay | Admitting: Internal Medicine

## 2019-07-06 ENCOUNTER — Other Ambulatory Visit: Payer: Self-pay

## 2019-07-06 DIAGNOSIS — I10 Essential (primary) hypertension: Secondary | ICD-10-CM

## 2019-07-06 MED ORDER — METOPROLOL TARTRATE 100 MG PO TABS: ORAL_TABLET | ORAL | 1 refills | Status: DC

## 2019-07-08 ENCOUNTER — Other Ambulatory Visit: Payer: Self-pay | Admitting: Student

## 2019-07-08 ENCOUNTER — Other Ambulatory Visit: Payer: Self-pay

## 2019-07-08 DIAGNOSIS — M5416 Radiculopathy, lumbar region: Secondary | ICD-10-CM

## 2019-07-08 DIAGNOSIS — M545 Low back pain: Secondary | ICD-10-CM | POA: Diagnosis not present

## 2019-07-08 DIAGNOSIS — G8929 Other chronic pain: Secondary | ICD-10-CM | POA: Diagnosis not present

## 2019-07-08 DIAGNOSIS — M5441 Lumbago with sciatica, right side: Secondary | ICD-10-CM | POA: Diagnosis not present

## 2019-07-08 DIAGNOSIS — M5136 Other intervertebral disc degeneration, lumbar region: Secondary | ICD-10-CM | POA: Diagnosis not present

## 2019-07-08 DIAGNOSIS — M5442 Lumbago with sciatica, left side: Secondary | ICD-10-CM

## 2019-07-09 ENCOUNTER — Other Ambulatory Visit: Payer: Self-pay

## 2019-07-09 MED ORDER — TRAZODONE HCL 100 MG PO TABS: ORAL_TABLET | ORAL | 1 refills | Status: DC

## 2019-07-12 ENCOUNTER — Ambulatory Visit: Payer: Medicare Other | Admitting: Gastroenterology

## 2019-07-19 ENCOUNTER — Other Ambulatory Visit: Payer: Self-pay

## 2019-07-19 MED ORDER — PRAVASTATIN SODIUM 40 MG PO TABS: 40.0000 mg | ORAL_TABLET | Freq: Every day | ORAL | 4 refills | Status: DC

## 2019-07-21 ENCOUNTER — Ambulatory Visit
Admission: RE | Admit: 2019-07-21 | Discharge: 2019-07-21 | Disposition: A | Discharge status: Home / Self Care | Payer: Medicare Other | Source: Ambulatory Visit | Attending: Student | Admitting: Student

## 2019-07-21 ENCOUNTER — Other Ambulatory Visit: Payer: Self-pay

## 2019-07-21 DIAGNOSIS — M5442 Lumbago with sciatica, left side: Secondary | ICD-10-CM | POA: Diagnosis not present

## 2019-07-21 DIAGNOSIS — M5441 Lumbago with sciatica, right side: Secondary | ICD-10-CM | POA: Insufficient documentation

## 2019-07-21 DIAGNOSIS — G8929 Other chronic pain: Secondary | ICD-10-CM | POA: Insufficient documentation

## 2019-07-21 DIAGNOSIS — M5416 Radiculopathy, lumbar region: Secondary | ICD-10-CM | POA: Insufficient documentation

## 2019-07-21 DIAGNOSIS — M545 Low back pain: Secondary | ICD-10-CM | POA: Diagnosis not present

## 2019-08-02 DIAGNOSIS — M5416 Radiculopathy, lumbar region: Secondary | ICD-10-CM | POA: Diagnosis not present

## 2019-08-02 DIAGNOSIS — M5136 Other intervertebral disc degeneration, lumbar region: Secondary | ICD-10-CM | POA: Diagnosis not present

## 2019-08-02 DIAGNOSIS — M48062 Spinal stenosis, lumbar region with neurogenic claudication: Secondary | ICD-10-CM | POA: Diagnosis not present

## 2019-08-09 ENCOUNTER — Other Ambulatory Visit: Payer: Self-pay

## 2019-08-09 DIAGNOSIS — I10 Essential (primary) hypertension: Secondary | ICD-10-CM

## 2019-08-09 MED ORDER — LISINOPRIL 20 MG PO TABS: 20.0000 mg | ORAL_TABLET | Freq: Every day | ORAL | 3 refills | Status: DC

## 2019-08-23 ENCOUNTER — Ambulatory Visit (INDEPENDENT_AMBULATORY_CARE_PROVIDER_SITE_OTHER): Payer: Medicare Other | Admitting: Nurse Practitioner

## 2019-08-23 ENCOUNTER — Other Ambulatory Visit: Payer: Self-pay

## 2019-08-23 ENCOUNTER — Encounter: Payer: Self-pay | Admitting: Nurse Practitioner

## 2019-08-23 VITALS — BP 128/80 | HR 58 | Temp 97.9°F | Resp 16 | Ht 67.0 in | Wt 146.0 lb

## 2019-08-23 DIAGNOSIS — Z1231 Encounter for screening mammogram for malignant neoplasm of breast: Secondary | ICD-10-CM

## 2019-08-23 DIAGNOSIS — I10 Essential (primary) hypertension: Secondary | ICD-10-CM | POA: Diagnosis not present

## 2019-08-23 DIAGNOSIS — M810 Age-related osteoporosis without current pathological fracture: Secondary | ICD-10-CM | POA: Diagnosis not present

## 2019-08-23 DIAGNOSIS — M545 Low back pain, unspecified: Secondary | ICD-10-CM

## 2019-08-23 MED ORDER — PREDNISONE 10 MG (21) PO TBPK: ORAL_TABLET | ORAL | 0 refills | Status: DC

## 2019-08-23 NOTE — Progress Notes (Signed)
[facility] [place], [place] [address]  Internal MEDICINE  Office Visit Note  Patient Name: Misty Robbins  [id number]  [id number]  Date of Service: 08/28/2019  Chief Complaint  Patient presents with  . Hypertension    Patient is here today for follow-up on her blood pressure. Blood pressure is maintained today at 128/80. Regularly checks blood pressure readings at home and home readings have been consistent with no elevated readings. Pulse rate today at 58, prescribed Metoprolol 100 mg BID, monitors pulse rate at home as well. Denies chest pain, dizziness, headaches or shortness of breath. Has recently been followed by orthopedics for back and had completed one epidural injection. Continues to complain of lower back pain but sciatica pain has resolved. Scheduled to follow-up with orthopedics  in 2 weeks for repeat injection.       Current Medication: Outpatient Encounter Medications as of 08/23/2019  Medication Sig  . albuterol (PROVENTIL HFA;VENTOLIN HFA) 108 (90 Base) MCG/ACT inhaler Inhale into the lungs every 6 (six) hours as needed for wheezing or shortness of breath.  . Calcium Carb-Cholecalciferol (CALCIUM 600+D) 600-800 MG-UNIT TABS Take 1 tablet by mouth daily.  Marland Kitchen dexlansoprazole (DEXILANT) 60 MG capsule +  TAKE 1 CAPSULE(60 MG) BY MOUTH DAILY  . estrogens, conjugated, (PREMARIN) 1.25 MG tablet Take 1 tablet (1.25 mg total) by mouth daily.  Marland Kitchen etodolac (LODINE) 500 MG tablet Take one tablet by mouth twice daily as needed for pain  . gabapentin (NEURONTIN) 300 MG capsule Take 300 mg by mouth 3 (three) times daily.  Marland Kitchen ibuprofen (ADVIL) 800 MG tablet Take 1 tablet by mouth as needed.  Marland Kitchen lisinopril (ZESTRIL) 20 MG tablet Take 1 tablet (20 mg total) by mouth daily.  . metoprolol tartrate (LOPRESSOR) 100 MG tablet Take 1 tablet po BID  . oxybutynin (DITROPAN-XL) 10 MG 24 hr tablet Take 10 mg by mouth daily.   Marland Kitchen oxyCODONE-acetaminophen (PERCOCET) 5-325 MG  tablet Take 2 tablets by mouth every 6 (six) hours as needed for severe pain.  . pravastatin (PRAVACHOL) 40 MG tablet Take 1 tablet (40 mg total) by mouth daily.  . traZODone (DESYREL) 100 MG tablet TAKE 1 TABLET(100 MG) BY MOUTH AT BEDTIME  . predniSONE (STERAPRED UNI-PAK 21 TAB) 10 MG (21) TBPK tablet 6 day taper - take by mouth as directed for 6 days  . [DISCONTINUED] predniSONE (DELTASONE) 10 MG tablet Use per dose pack (Patient not taking: Reported on 08/23/2019)   No facility-administered encounter medications on file as of 08/23/2019.     Surgical History: Past Surgical History:  Procedure Laterality Date  . ABDOMINAL HYSTERECTOMY  2000  . APPENDECTOMY    . BACK SURGERY    . CERVICAL FUSION  [id number]   c2-7  . COLONOSCOPY WITH PROPOFOL N/A 04/13/2018   Procedure: COLONOSCOPY WITH PROPOFOL;  Surgeon: Lucilla Lame, MD;  Location: [facility];  Service: Endoscopy;  Laterality: N/A;  . ESOPHAGOGASTRODUODENOSCOPY (EGD) WITH PROPOFOL N/A 04/13/2018   Procedure: ESOPHAGOGASTRODUODENOSCOPY (EGD) WITH PROPOFOL;  Surgeon: Lucilla Lame, MD;  Location: [facility];  Service: Endoscopy;  Laterality: N/A;  . LAPAROSCOPY N/A 05/25/2018   Procedure: LAPAROSCOPY DIAGNOSTIC;  Surgeon: Ouida Sills [name], MD;  Location: [facility];  Service: Gynecology;  Laterality: N/A;  . LYSIS OF ADHESION N/A 05/25/2018   Procedure: LYSIS OF ADHESION;  Surgeon: Ouida Sills, [name], MD;  Location: [facility];  Service: Gynecology;  Laterality: N/A;  . POLYPECTOMY  04/13/2018   Procedure: POLYPECTOMY;  Surgeon: Allen Norris,  Darren, MD;  Location: [facility];  Service: Endoscopy;;  . SKIN GRAFT  1981   from right thigh to bilateral ankles and right knee  . TONSILLECTOMY  2013  . TRACHELECTOMY N/A 05/25/2018   Procedure: TRACHELECTOMY;  Surgeon: Schermerhorn, [name], MD;  Location: [facility];  Service: Gynecology;  Laterality: N/A;    Medical History: Past Medical History:  Diagnosis Date  .  Allergy   . Anxiety   . Arthritis   . Asthma   . Bipolar 1 disorder ([place])   . Chest pain   . Depression   . GERD (gastroesophageal reflux disease)   . History of kidney stones   . Hyperlipidemia   . Hypertension   . Insomnia   . Mitral valve regurgitation   . Osteoporosis   . Peripheral neuropathy   . Tricuspid valve regurgitation     Family History: Family History  Problem Relation Age of Onset  . Breast cancer Paternal Aunt        33's  . Heart disease Mother   . Hypertension Mother   . Stroke Mother   . Stroke Father   . Cancer Father   . Heart disease Father   . Diabetes Sister   . Diabetes Brother     Social History   Socioeconomic History  . Marital status: Married    Spouse name: Not on file  . Number of children: Not on file  . Years of education: Not on file  . Highest education level: Not on file  Occupational History  . Not on file  Social Needs  . Financial resource strain: Not on file  . Food insecurity    Worry: Not on file    Inability: Not on file  . Transportation needs    Medical: Not on file    Non-medical: Not on file  Tobacco Use  . Smoking status: Former Smoker    Packs/day: 0.50    Years: 25.00    Pack years: 12.50    Types: Cigarettes  . Smokeless tobacco: Never Used  . Tobacco comment: not since september 30  Substance and Sexual Activity  . Alcohol use: Not Currently  . Drug use: No  . Sexual activity: Not on file  Lifestyle  . Physical activity    Days per week: Not on file    Minutes per session: Not on file  . Stress: Not on file  Relationships  . Social Herbalist on phone: Not on file    Gets together: Not on file    Attends religious service: Not on file    Active member of club or organization: Not on file    Attends meetings of clubs or organizations: Not on file    Relationship status: Not on file  . Intimate partner violence    Fear of current or ex partner: Not on file    Emotionally abused:  Not on file    Physically abused: Not on file    Forced sexual activity: Not on file  Other Topics Concern  . Not on file  Social History Narrative  . Not on file      Review of Systems  Constitutional: Positive for activity change. Negative for fatigue.       Decreased activity level due to back pain/sciatica.   HENT: Negative for congestion, postnasal drip, rhinorrhea, sinus pressure and sinus pain.   Respiratory: Negative for shortness of breath and wheezing.   Cardiovascular: Negative for chest  pain and palpitations.       Elevated blood pressure.  Gastrointestinal: Negative for constipation, diarrhea, nausea and vomiting.  Endocrine: Negative for cold intolerance, heat intolerance, polydipsia and polyuria.  Musculoskeletal: Positive for back pain and myalgias.  Allergic/Immunologic: Negative for environmental allergies.  Neurological: Negative for dizziness and headaches.  Hematological: Negative for adenopathy.  Psychiatric/Behavioral: The patient is nervous/anxious.     Today's Vitals   08/23/19 1209  BP: 128/80  Pulse: (!) 58  Resp: 16  Temp: 97.9 F (36.6 C)  SpO2: 99%  Weight: 146 lb (66.2 kg)  Height: 5\' 7"  (1.702 m)   Body mass index is 22.87 kg/m.  Physical Exam Vitals signs and nursing note reviewed.  Constitutional:      Appearance: Normal appearance. She is well-developed.  HENT:     Head: Normocephalic.  Eyes:     Pupils: Pupils are equal, round, and reactive to light.  Neck:     Musculoskeletal: Normal range of motion and neck supple.  Cardiovascular:     Rate and Rhythm: Normal rate and regular rhythm.  Pulmonary:     Effort: Pulmonary effort is normal.     Breath sounds: Normal breath sounds.  Abdominal:     Palpations: Abdomen is soft.     Tenderness: There is no abdominal tenderness.  Musculoskeletal: Normal range of motion.     Comments: Low back pain with ambulation  Skin:    General: Skin is warm and dry.  Neurological:      Mental Status: She is alert and oriented to person, place, and time.     Assessment/Plan: 1. Essential hypertension Well maintained on current therapy, continue to monitor.  2. Osteoporosis, post-menopausal Previous diagnosis of osteoporosis from bone density scan. - DG Bone Density; Future  3. Acute midline low back pain without sciatica Being seen by orthopedics, s/p epidural injection 3 weeks prior. Complaints of low back pain, resolved sciatica pain. - predniSONE (STERAPRED UNI-PAK 21 TAB) 10 MG (21) TBPK tablet; 6 day taper - take by mouth as directed for 6 days  Dispense: 21 tablet; Refill: 0  4. Encounter for screening mammogram for breast cancer Routine annual screening. - MM DIGITAL SCREENING BILATERAL; Future   General Counseling: Jenniefer verbalizes understanding of the findings of todays visit and agrees with plan of treatment. I have discussed any further diagnostic evaluation that may be needed or ordered today. We also reviewed her medications today. she has been encouraged to call the office with any questions or concerns that should arise related to todays visit.  This patient was seen by Leretha Pol FNP Collaboration with Dr Lavera Guise as a part of collaborative care agreement  Orders Placed This Encounter  Procedures  . DG Bone Density  . MM DIGITAL SCREENING BILATERAL    Meds ordered this encounter  Medications  . predniSONE (STERAPRED UNI-PAK 21 TAB) 10 MG (21) TBPK tablet    Sig: 6 day taper - take by mouth as directed for 6 days    Dispense:  21 tablet    Refill:  0    Order Specific Question:   Supervising Provider    Answer:   Lavera Guise [id number]    Time spent: 80 Minutes      Dr Lavera Guise Internal medicine

## 2019-09-01 DIAGNOSIS — M5416 Radiculopathy, lumbar region: Secondary | ICD-10-CM | POA: Diagnosis not present

## 2019-09-01 DIAGNOSIS — M48062 Spinal stenosis, lumbar region with neurogenic claudication: Secondary | ICD-10-CM | POA: Diagnosis not present

## 2019-09-01 DIAGNOSIS — M5136 Other intervertebral disc degeneration, lumbar region: Secondary | ICD-10-CM | POA: Diagnosis not present

## 2019-09-14 ENCOUNTER — Other Ambulatory Visit: Payer: Self-pay | Admitting: Adult Health

## 2019-09-28 DIAGNOSIS — M5416 Radiculopathy, lumbar region: Secondary | ICD-10-CM | POA: Diagnosis not present

## 2019-10-11 ENCOUNTER — Other Ambulatory Visit: Payer: Self-pay

## 2019-10-11 DIAGNOSIS — I10 Essential (primary) hypertension: Secondary | ICD-10-CM

## 2019-10-11 MED ORDER — METOPROLOL TARTRATE 100 MG PO TABS: ORAL_TABLET | ORAL | 1 refills | Status: DC

## 2019-10-11 MED ORDER — GABAPENTIN 300 MG PO CAPS: 300.0000 mg | ORAL_CAPSULE | Freq: Three times a day (TID) | ORAL | 1 refills | Status: DC

## 2019-10-11 MED ORDER — ESTROGENS CONJUGATED 1.25 MG PO TABS: 1.2500 mg | ORAL_TABLET | Freq: Every day | ORAL | 3 refills | Status: DC

## 2019-10-13 ENCOUNTER — Other Ambulatory Visit: Payer: Self-pay

## 2019-10-13 MED ORDER — METOPROLOL TARTRATE 50 MG PO TABS: 50.0000 mg | ORAL_TABLET | Freq: Two times a day (BID) | ORAL | 1 refills | Status: DC

## 2019-11-09 ENCOUNTER — Other Ambulatory Visit: Payer: Self-pay | Admitting: Internal Medicine

## 2019-11-11 DIAGNOSIS — M48062 Spinal stenosis, lumbar region with neurogenic claudication: Secondary | ICD-10-CM | POA: Diagnosis not present

## 2019-11-11 DIAGNOSIS — M5416 Radiculopathy, lumbar region: Secondary | ICD-10-CM | POA: Diagnosis not present

## 2019-11-11 DIAGNOSIS — M5136 Other intervertebral disc degeneration, lumbar region: Secondary | ICD-10-CM | POA: Diagnosis not present

## 2019-11-14 ENCOUNTER — Other Ambulatory Visit: Payer: Self-pay | Admitting: Adult Health

## 2019-11-14 DIAGNOSIS — I10 Essential (primary) hypertension: Secondary | ICD-10-CM

## 2019-11-18 ENCOUNTER — Telehealth: Payer: Self-pay

## 2019-11-18 NOTE — Telephone Encounter (Signed)
CONFIRMED 11-23-19 OV AS VIRTUAL. CONFIRMED PT HAS BP CUFF AT HOME.

## 2019-11-23 ENCOUNTER — Ambulatory Visit (INDEPENDENT_AMBULATORY_CARE_PROVIDER_SITE_OTHER): Payer: Medicare Other | Admitting: Nurse Practitioner

## 2019-11-23 ENCOUNTER — Encounter: Payer: Self-pay | Admitting: Nurse Practitioner

## 2019-11-23 VITALS — BP 131/89 | HR 87 | Temp 97.9°F | Ht 67.0 in | Wt 148.6 lb

## 2019-11-23 DIAGNOSIS — K219 Gastro-esophageal reflux disease without esophagitis: Secondary | ICD-10-CM

## 2019-11-23 DIAGNOSIS — M8949 Other hypertrophic osteoarthropathy, multiple sites: Secondary | ICD-10-CM

## 2019-11-23 DIAGNOSIS — M159 Polyosteoarthritis, unspecified: Secondary | ICD-10-CM

## 2019-11-23 DIAGNOSIS — I10 Essential (primary) hypertension: Secondary | ICD-10-CM

## 2019-11-23 MED ORDER — ETODOLAC 500 MG PO TABS: ORAL_TABLET | ORAL | 3 refills | Status: DC

## 2019-11-23 NOTE — Progress Notes (Signed)
[facility] [place], [place] [address]  Internal MEDICINE  Telephone Visit  Patient Name: Misty Robbins  [id number]  [id number]  Date of Service: 11/23/2019  I connected with the patient at 9:28am by webcam and verified the patients identity using two identifiers.   I discussed the limitations, risks, security and privacy concerns of performing an evaluation and management service by webcam and the availability of in person appointments. I also discussed with the patient that there may be a patient responsible charge related to the service.  The patient expressed understanding and agrees to proceed.    Chief Complaint  Patient presents with  . Telephone Assessment  . Telephone Screen  . Hypertension  . Hyperlipidemia  . Gastroesophageal Reflux  . Depression  . Anxiety  . Arthritis    The patient has been contacted via webcam for follow up visit due to concerns for spread of novel coronavirus. The patient presents for routine visit. Blood pressure is well managed on current medications. She states that she is doing well with no new concerns or complaints. She states that she does need to have refill for her etodolac. She will take this up to twice daily when needed for pain and inflammation. She is already scheduled for health maintenance exam in 02/2020.       Current Medication: Outpatient Encounter Medications as of 11/23/2019  Medication Sig  . albuterol (PROVENTIL HFA;VENTOLIN HFA) 108 (90 Base) MCG/ACT inhaler Inhale into the lungs every 6 (six) hours as needed for wheezing or shortness of breath.  Marland Kitchen amoxicillin (AMOXIL) 875 MG tablet Take 875 mg by mouth 2 (two) times daily. FOR 7 DAYS.  . Calcium Carb-Cholecalciferol (CALCIUM 600+D) 600-800 MG-UNIT TABS Take 1 tablet by mouth daily.  Marland Kitchen dexlansoprazole (DEXILANT) 60 MG capsule +  TAKE 1 CAPSULE(60 MG) BY MOUTH DAILY  . estrogens, conjugated, (PREMARIN) 1.25 MG tablet Take 1 tablet (1.25 mg total) by  mouth daily.  Marland Kitchen etodolac (LODINE) 500 MG tablet Take one tablet by mouth twice daily as needed for pain  . gabapentin (NEURONTIN) 300 MG capsule Take 1 capsule (300 mg total) by mouth 3 (three) times daily.  Marland Kitchen ibuprofen (ADVIL) 800 MG tablet Take 1 tablet by mouth as needed.  Marland Kitchen lisinopril (ZESTRIL) 20 MG tablet TAKE 1 TABLET(20 MG) BY MOUTH DAILY  . metoprolol tartrate (LOPRESSOR) 50 MG tablet Take 1 tablet (50 mg total) by mouth 2 (two) times daily.  Marland Kitchen oxybutynin (DITROPAN-XL) 10 MG 24 hr tablet Take 10 mg by mouth daily.   Marland Kitchen oxyCODONE-acetaminophen (PERCOCET) 5-325 MG tablet Take 2 tablets by mouth every 6 (six) hours as needed for severe pain.  . pravastatin (PRAVACHOL) 40 MG tablet Take 1 tablet (40 mg total) by mouth daily.  . traMADol (ULTRAM) 50 MG tablet 1-2 TAB EVERY 4-6 HOURS PRN. NOT TO EXCEED 8 IN ONE DAY. GIVEN #20  . traZODone (DESYREL) 100 MG tablet TAKE 1 TABLET(100 MG) BY MOUTH AT BEDTIME  . [DISCONTINUED] etodolac (LODINE) 500 MG tablet Take one tablet by mouth twice daily as needed for pain  . [DISCONTINUED] predniSONE (STERAPRED UNI-PAK 21 TAB) 10 MG (21) TBPK tablet 6 day taper - take by mouth as directed for 6 days (Patient not taking: Reported on 11/23/2019)   No facility-administered encounter medications on file as of 11/23/2019.    Surgical History: Past Surgical History:  Procedure Laterality Date  . ABDOMINAL HYSTERECTOMY  2000  . APPENDECTOMY    . BACK SURGERY    .  CERVICAL FUSION  [id number]   c2-7  . COLONOSCOPY WITH PROPOFOL N/A 04/13/2018   Procedure: COLONOSCOPY WITH PROPOFOL;  Surgeon: Lucilla Lame, MD;  Location: [place];  Service: Endoscopy;  Laterality: N/A;  . ESOPHAGOGASTRODUODENOSCOPY (EGD) WITH PROPOFOL N/A 04/13/2018   Procedure: ESOPHAGOGASTRODUODENOSCOPY (EGD) WITH PROPOFOL;  Surgeon: Lucilla Lame, MD;  Location: [place];  Service: Endoscopy;  Laterality: N/A;  . LAPAROSCOPY N/A 05/25/2018   Procedure: LAPAROSCOPY  DIAGNOSTIC;  Surgeon: Ouida Sills [name], MD;  Location: [facility];  Service: Gynecology;  Laterality: N/A;  . LYSIS OF ADHESION N/A 05/25/2018   Procedure: LYSIS OF ADHESION;  Surgeon: Ouida Sills, [name], MD;  Location: [facility];  Service: Gynecology;  Laterality: N/A;  . POLYPECTOMY  04/13/2018   Procedure: POLYPECTOMY;  Surgeon: Lucilla Lame, MD;  Location: [place];  Service: Endoscopy;;  . SKIN GRAFT  1981   from right thigh to bilateral ankles and right knee  . TONSILLECTOMY  2013  . TRACHELECTOMY N/A 05/25/2018   Procedure: TRACHELECTOMY;  Surgeon: Schermerhorn, [name], MD;  Location: [facility];  Service: Gynecology;  Laterality: N/A;    Medical History: Past Medical History:  Diagnosis Date  . Allergy   . Anxiety   . Arthritis   . Asthma   . Bipolar 1 disorder ([place])   . Chest pain   . Depression   . GERD (gastroesophageal reflux disease)   . History of kidney stones   . Hyperlipidemia   . Hypertension   . Insomnia   . Mitral valve regurgitation   . Osteoporosis   . Peripheral neuropathy   . Tricuspid valve regurgitation     Family History: Family History  Problem Relation Age of Onset  . Breast cancer Paternal Aunt        76's  . Heart disease Mother   . Hypertension Mother   . Stroke Mother   . Stroke Father   . Cancer Father   . Heart disease Father   . Diabetes Sister   . Diabetes Brother     Social History   Socioeconomic History  . Marital status: Married    Spouse name: Not on file  . Number of children: Not on file  . Years of education: Not on file  . Highest education level: Not on file  Occupational History  . Not on file  Tobacco Use  . Smoking status: Former Smoker    Packs/day: 0.50    Years: 25.00    Pack years: 12.50    Types: Cigarettes  . Smokeless tobacco: Never Used  . Tobacco comment: not since september 30  Substance and Sexual Activity  . Alcohol use: Not Currently  . Drug use: No  . Sexual activity:  Not on file  Other Topics Concern  . Not on file  Social History Narrative  . Not on file   Social Determinants of Health   Financial Resource Strain:   . Difficulty of Paying Living Expenses: Not on file  Food Insecurity:   . Worried About Charity fundraiser in the Last Year: Not on file  . Ran Out of Food in the Last Year: Not on file  Transportation Needs:   . Lack of Transportation (Medical): Not on file  . Lack of Transportation (Non-Medical): Not on file  Physical Activity:   . Days of Exercise per Week: Not on file  . Minutes of Exercise per Session: Not on file  Stress:   . Feeling of Stress :  Not on file  Social Connections:   . Frequency of Communication with Friends and Family: Not on file  . Frequency of Social Gatherings with Friends and Family: Not on file  . Attends Religious Services: Not on file  . Active Member of Clubs or Organizations: Not on file  . Attends Archivist Meetings: Not on file  . Marital Status: Not on file  Intimate Partner Violence:   . Fear of Current or Ex-Partner: Not on file  . Emotionally Abused: Not on file  . Physically Abused: Not on file  . Sexually Abused: Not on file      Review of Systems  Constitutional: Negative for activity change and fatigue.  HENT: Negative for congestion, postnasal drip, rhinorrhea, sinus pressure and sinus pain.   Respiratory: Negative for shortness of breath and wheezing.   Cardiovascular: Negative for chest pain and palpitations.       Elevated blood pressure.  Gastrointestinal: Negative for constipation, diarrhea, nausea and vomiting.  Endocrine: Negative for cold intolerance, heat intolerance, polydipsia and polyuria.  Musculoskeletal: Positive for back pain and myalgias.       Chronic. Sees pain management provider and gets epidural injections into her back every two weeks.   Allergic/Immunologic: Negative for environmental allergies.  Neurological: Negative for dizziness and  headaches.  Hematological: Negative for adenopathy.  Psychiatric/Behavioral: The patient is nervous/anxious.    Today's Vitals   11/23/19 0848  BP: 131/89  Pulse: 87  Temp: 97.9 F (36.6 C)  Weight: 148 lb 9.6 oz (67.4 kg)  Height: 5\' 7"  (1.702 m)   Body mass index is 23.27 kg/m.  Observation/Objective:   The patient is alert and oriented. She is pleasant and answers all questions appropriately. Breathing is non-labored. She is in no acute distress at this time.    Assessment/Plan: 1. Essential hypertension Stable. Continue bp medication as prescribed  2. Primary osteoarthritis involving multiple joints May continue lodine 500mg  twice daily as needed for pain/inflammation. cotinue regular visits with pain management provider to treat chronic back pain.  - etodolac (LODINE) 500 MG tablet; Take one tablet by mouth twice daily as needed for pain  Dispense: 60 tablet; Refill: 3  3. Gastroesophageal reflux disease without esophagitis continue dexilant as previously prescribed   General Counseling: Shakelia verbalizes understanding of the findings of today's phone visit and agrees with plan of treatment. I have discussed any further diagnostic evaluation that may be needed or ordered today. We also reviewed her medications today. she has been encouraged to call the office with any questions or concerns that should arise related to todays visit.  This patient was seen by [place] with Dr Lavera Guise as a part of collaborative care agreement  Meds ordered this encounter  Medications  . etodolac (LODINE) 500 MG tablet    Sig: Take one tablet by mouth twice daily as needed for pain    Dispense:  60 tablet    Refill:  3    Order Specific Question:   Supervising Provider    Answer:   Lavera Guise [id number]    Time spent: 69 Minutes    Dr Lavera Guise Internal medicine

## 2019-12-08 DIAGNOSIS — M48062 Spinal stenosis, lumbar region with neurogenic claudication: Secondary | ICD-10-CM | POA: Diagnosis not present

## 2019-12-08 DIAGNOSIS — M5416 Radiculopathy, lumbar region: Secondary | ICD-10-CM | POA: Diagnosis not present

## 2019-12-08 DIAGNOSIS — M546 Pain in thoracic spine: Secondary | ICD-10-CM | POA: Diagnosis not present

## 2019-12-08 DIAGNOSIS — M5136 Other intervertebral disc degeneration, lumbar region: Secondary | ICD-10-CM | POA: Diagnosis not present

## 2019-12-08 DIAGNOSIS — M5489 Other dorsalgia: Secondary | ICD-10-CM | POA: Diagnosis not present

## 2019-12-10 ENCOUNTER — Other Ambulatory Visit: Payer: Self-pay

## 2019-12-10 MED ORDER — DEXILANT 60 MG PO CPDR: DELAYED_RELEASE_CAPSULE | ORAL | 3 refills | Status: DC

## 2019-12-10 MED ORDER — GABAPENTIN 300 MG PO CAPS: 300.0000 mg | ORAL_CAPSULE | Freq: Three times a day (TID) | ORAL | 1 refills | Status: DC

## 2019-12-10 MED ORDER — PRAVASTATIN SODIUM 40 MG PO TABS: 40.0000 mg | ORAL_TABLET | Freq: Every day | ORAL | 4 refills | Status: DC

## 2020-01-09 ENCOUNTER — Other Ambulatory Visit: Payer: Self-pay | Admitting: Internal Medicine

## 2020-01-13 ENCOUNTER — Other Ambulatory Visit: Payer: Self-pay

## 2020-01-13 MED ORDER — PRAVASTATIN SODIUM 40 MG PO TABS: 40.0000 mg | ORAL_TABLET | Freq: Every day | ORAL | 4 refills | Status: DC

## 2020-02-14 ENCOUNTER — Other Ambulatory Visit: Payer: Self-pay

## 2020-02-14 DIAGNOSIS — I10 Essential (primary) hypertension: Secondary | ICD-10-CM

## 2020-02-14 MED ORDER — LISINOPRIL 20 MG PO TABS: ORAL_TABLET | ORAL | 3 refills | Status: DC

## 2020-02-22 ENCOUNTER — Other Ambulatory Visit: Payer: Self-pay

## 2020-02-22 MED ORDER — DEXILANT 60 MG PO CPDR: DELAYED_RELEASE_CAPSULE | ORAL | 3 refills | Status: DC

## 2020-03-15 ENCOUNTER — Telehealth: Payer: Self-pay

## 2020-03-15 NOTE — Telephone Encounter (Signed)
Lmom to confirm and screen for 03-17-20 ov.

## 2020-03-17 ENCOUNTER — Ambulatory Visit: Payer: Medicare Other | Admitting: Nurse Practitioner

## 2020-04-04 ENCOUNTER — Other Ambulatory Visit: Payer: Self-pay

## 2020-04-04 MED ORDER — ESTROGENS CONJUGATED 1.25 MG PO TABS: 1.2500 mg | ORAL_TABLET | Freq: Every day | ORAL | 3 refills | Status: DC

## 2020-04-08 ENCOUNTER — Other Ambulatory Visit: Payer: Self-pay | Admitting: Internal Medicine

## 2020-06-21 ENCOUNTER — Other Ambulatory Visit: Payer: Self-pay | Admitting: Internal Medicine

## 2020-06-21 ENCOUNTER — Other Ambulatory Visit: Payer: Self-pay

## 2020-06-21 MED ORDER — METOPROLOL TARTRATE 50 MG PO TABS: 50.0000 mg | ORAL_TABLET | Freq: Two times a day (BID) | ORAL | 0 refills | Status: DC

## 2020-06-21 NOTE — Telephone Encounter (Signed)
Pt need appt for med refills  

## 2020-08-02 ENCOUNTER — Other Ambulatory Visit: Payer: Self-pay | Admitting: Internal Medicine

## 2020-09-11 ENCOUNTER — Other Ambulatory Visit: Payer: Self-pay

## 2020-09-11 MED ORDER — METOPROLOL TARTRATE 50 MG PO TABS: 50.0000 mg | ORAL_TABLET | Freq: Two times a day (BID) | ORAL | 0 refills | Status: DC

## 2020-09-25 ENCOUNTER — Ambulatory Visit (INDEPENDENT_AMBULATORY_CARE_PROVIDER_SITE_OTHER): Payer: Medicare Other | Admitting: Hospice and Palliative Medicine

## 2020-09-25 ENCOUNTER — Encounter: Payer: Self-pay | Admitting: Hospice and Palliative Medicine

## 2020-09-25 ENCOUNTER — Other Ambulatory Visit: Payer: Self-pay

## 2020-09-25 DIAGNOSIS — R7301 Impaired fasting glucose: Secondary | ICD-10-CM

## 2020-09-25 DIAGNOSIS — I10 Essential (primary) hypertension: Secondary | ICD-10-CM

## 2020-09-25 DIAGNOSIS — F5101 Primary insomnia: Secondary | ICD-10-CM

## 2020-09-25 DIAGNOSIS — Z0001 Encounter for general adult medical examination with abnormal findings: Secondary | ICD-10-CM

## 2020-09-25 LAB — POCT GLYCOSYLATED HEMOGLOBIN (HGB A1C): Hemoglobin A1C: 5.4 % (ref 4.0–5.6)

## 2020-09-25 MED ORDER — TRAZODONE HCL 100 MG PO TABS: ORAL_TABLET | ORAL | 0 refills | Status: DC

## 2020-09-25 MED ORDER — METOPROLOL TARTRATE 50 MG PO TABS: 50.0000 mg | ORAL_TABLET | Freq: Two times a day (BID) | ORAL | 0 refills | Status: DC

## 2020-09-25 NOTE — Progress Notes (Signed)
[facility] [place], [place] [address]  Internal MEDICINE  Office Visit Note  Patient Name: Misty Robbins  [id number]  [id number]  Date of Service: 09/28/2020  Chief Complaint  Patient presents with  . Follow-up    refill request  . Anxiety  . Asthma  . Hyperlipidemia  . Hypertension  . Depression  . Gastroesophageal Reflux  . policy update form    received    HPI Patient is here for routine follow-up Overall things have been going fairly well She mentions that in the past she was told by her provider that we would be following along with her glucose levels, she understood her last A1C level to be 6.4--was told if her A1C went any higher treatment would be initiated Last A1C found to be documented in her chart from April 2019 5.4--no previous documentation since Requesting refills of metoprolol--has been without her lisinopril for a few days, says this is why her BP is reading high today Questioned her about metoprolol therapy--was told she had "leaky valves" and required metoprolol therapy for this--echocardiogram from 2020, trace MR and TR  Requesting refills on trazodone she uses to help with sleep--says this continues to help with her insomnia  Current Medication: Outpatient Encounter Medications as of 09/25/2020  Medication Sig  . albuterol (PROVENTIL HFA;VENTOLIN HFA) 108 (90 Base) MCG/ACT inhaler Inhale into the lungs every 6 (six) hours as needed for wheezing or shortness of breath.  . Calcium Carb-Cholecalciferol (CALCIUM 600+D) 600-800 MG-UNIT TABS Take 1 tablet by mouth daily.  Marland Kitchen ibuprofen (ADVIL) 800 MG tablet Take 1 tablet by mouth as needed.  Marland Kitchen lisinopril (ZESTRIL) 20 MG tablet TAKE 1 TABLET(20 MG) BY MOUTH DAILY  . metoprolol tartrate (LOPRESSOR) 50 MG tablet Take 1 tablet (50 mg total) by mouth 2 (two) times daily.  . traZODone (DESYREL) 100 MG tablet Take one tablet each night for sleep.  . [DISCONTINUED] metoprolol tartrate  (LOPRESSOR) 50 MG tablet Take 1 tablet (50 mg total) by mouth 2 (two) times daily.  . [DISCONTINUED] traZODone (DESYREL) 100 MG tablet TAKE 1 TABLET(100 MG) BY MOUTH AT BEDTIME  . [DISCONTINUED] amoxicillin (AMOXIL) 875 MG tablet Take 875 mg by mouth 2 (two) times daily. FOR 7 DAYS. (Patient not taking: Reported on 09/25/2020)  . [DISCONTINUED] dexlansoprazole (DEXILANT) 60 MG capsule +  TAKE 1 CAPSULE(60 MG) BY MOUTH DAILY (Patient not taking: Reported on 09/25/2020)  . [DISCONTINUED] estrogens, conjugated, (PREMARIN) 1.25 MG tablet Take 1 tablet (1.25 mg total) by mouth daily. (Patient not taking: Reported on 09/25/2020)  . [DISCONTINUED] etodolac (LODINE) 500 MG tablet Take one tablet by mouth twice daily as needed for pain (Patient not taking: Reported on 09/25/2020)  . [DISCONTINUED] gabapentin (NEURONTIN) 300 MG capsule Take 1 capsule (300 mg total) by mouth 3 (three) times daily. (Patient not taking: Reported on 09/25/2020)  . [DISCONTINUED] oxybutynin (DITROPAN-XL) 10 MG 24 hr tablet TAKE 1 TABLET(10 MG) BY MOUTH EVERY DAY (Patient not taking: Reported on 09/25/2020)  . [DISCONTINUED] oxyCODONE-acetaminophen (PERCOCET) 5-325 MG tablet Take 2 tablets by mouth every 6 (six) hours as needed for severe pain. (Patient not taking: Reported on 09/25/2020)  . [DISCONTINUED] pravastatin (PRAVACHOL) 40 MG tablet Take 1 tablet (40 mg total) by mouth daily. (Patient not taking: Reported on 09/25/2020)  . [DISCONTINUED] traMADol (ULTRAM) 50 MG tablet 1-2 TAB EVERY 4-6 HOURS PRN. NOT TO EXCEED 8 IN ONE DAY. GIVEN #20 (Patient not taking: Reported on 09/25/2020)   No facility-administered encounter medications  on file as of 09/25/2020.    Surgical History: Past Surgical History:  Procedure Laterality Date  . ABDOMINAL HYSTERECTOMY  2000  . APPENDECTOMY    . BACK SURGERY    . CERVICAL FUSION  [id number]   c2-7  . COLONOSCOPY WITH PROPOFOL N/A 04/13/2018   Procedure: COLONOSCOPY WITH PROPOFOL;  Surgeon:  Lucilla Lame, MD;  Location: [place];  Service: Endoscopy;  Laterality: N/A;  . ESOPHAGOGASTRODUODENOSCOPY (EGD) WITH PROPOFOL N/A 04/13/2018   Procedure: ESOPHAGOGASTRODUODENOSCOPY (EGD) WITH PROPOFOL;  Surgeon: Lucilla Lame, MD;  Location: [place];  Service: Endoscopy;  Laterality: N/A;  . LAPAROSCOPY N/A 05/25/2018   Procedure: LAPAROSCOPY DIAGNOSTIC;  Surgeon: Ouida Sills [name], MD;  Location: [facility];  Service: Gynecology;  Laterality: N/A;  . LYSIS OF ADHESION N/A 05/25/2018   Procedure: LYSIS OF ADHESION;  Surgeon: Ouida Sills, [name], MD;  Location: [facility];  Service: Gynecology;  Laterality: N/A;  . POLYPECTOMY  04/13/2018   Procedure: POLYPECTOMY;  Surgeon: Lucilla Lame, MD;  Location: [place];  Service: Endoscopy;;  . SKIN GRAFT  1981   from right thigh to bilateral ankles and right knee  . TONSILLECTOMY  2013  . TRACHELECTOMY N/A 05/25/2018   Procedure: TRACHELECTOMY;  Surgeon: Schermerhorn, [name], MD;  Location: [facility];  Service: Gynecology;  Laterality: N/A;    Medical History: Past Medical History:  Diagnosis Date  . Allergy   . Anxiety   . Arthritis   . Asthma   . Bipolar 1 disorder ([place])   . Chest pain   . Depression   . GERD (gastroesophageal reflux disease)   . History of kidney stones   . Hyperlipidemia   . Hypertension   . Insomnia   . Mitral valve regurgitation   . Osteoporosis   . Peripheral neuropathy   . Tricuspid valve regurgitation     Family History: Family History  Problem Relation Age of Onset  . Breast cancer Paternal Aunt        82's  . Heart disease Mother   . Hypertension Mother   . Stroke Mother   . Stroke Father   . Cancer Father   . Heart disease Father   . Diabetes Sister   . Diabetes Brother     Social History   Socioeconomic History  . Marital status: Married    Spouse name: Not on file  . Number of children: Not on file  . Years of education: Not on file  . Highest  education level: Not on file  Occupational History  . Not on file  Tobacco Use  . Smoking status: Former Smoker    Packs/day: 0.50    Years: 25.00    Pack years: 12.50    Types: Cigarettes  . Smokeless tobacco: Never Used  . Tobacco comment: not since september 30  Vaping Use  . Vaping Use: Never used  Substance and Sexual Activity  . Alcohol use: Not Currently  . Drug use: No  . Sexual activity: Not on file  Other Topics Concern  . Not on file  Social History Narrative  . Not on file   Social Determinants of Health   Financial Resource Strain:   . Difficulty of Paying Living Expenses: Not on file  Food Insecurity:   . Worried About Charity fundraiser in the Last Year: Not on file  . Ran Out of Food in the Last Year: Not on file  Transportation Needs:   . Lack of Transportation (Medical):  Not on file  . Lack of Transportation (Non-Medical): Not on file  Physical Activity:   . Days of Exercise per Week: Not on file  . Minutes of Exercise per Session: Not on file  Stress:   . Feeling of Stress : Not on file  Social Connections:   . Frequency of Communication with Friends and Family: Not on file  . Frequency of Social Gatherings with Friends and Family: Not on file  . Attends Religious Services: Not on file  . Active Member of Clubs or Organizations: Not on file  . Attends Archivist Meetings: Not on file  . Marital Status: Not on file  Intimate Partner Violence:   . Fear of Current or Ex-Partner: Not on file  . Emotionally Abused: Not on file  . Physically Abused: Not on file  . Sexually Abused: Not on file      Review of Systems  Constitutional: Negative for chills, diaphoresis and fatigue.  HENT: Negative for ear pain, postnasal drip and sinus pressure.   Eyes: Negative for photophobia, discharge, redness, itching and visual disturbance.  Respiratory: Negative for cough, shortness of breath and wheezing.   Cardiovascular: Negative for chest pain,  palpitations and leg swelling.  Gastrointestinal: Negative for abdominal pain, constipation, diarrhea, nausea and vomiting.  Genitourinary: Negative for dysuria and flank pain.  Musculoskeletal: Positive for back pain. Negative for arthralgias, gait problem and neck pain.  Skin: Negative for color change.  Allergic/Immunologic: Negative for environmental allergies and food allergies.  Neurological: Negative for dizziness and headaches.  Hematological: Does not bruise/bleed easily.  Psychiatric/Behavioral: Negative for agitation, behavioral problems (depression) and hallucinations.    Vital Signs: BP (!) 158/78   Pulse 65   Temp (!) 97.4 F (36.3 C)   Resp 16   Ht 5\' 7"  (1.702 m)   Wt 145 lb 12.8 oz (66.1 kg)   SpO2 98%   BMI 22.84 kg/m    Physical Exam Vitals reviewed.  Constitutional:      Appearance: Normal appearance. She is normal weight.  Cardiovascular:     Rate and Rhythm: Normal rate and regular rhythm.     Pulses: Normal pulses.     Heart sounds: Normal heart sounds.  Pulmonary:     Effort: Pulmonary effort is normal.     Breath sounds: Normal breath sounds.  Abdominal:     General: Abdomen is flat.     Palpations: Abdomen is soft.  Musculoskeletal:        General: Normal range of motion.     Cervical back: Normal range of motion.  Skin:    General: Skin is warm.  Neurological:     General: No focal deficit present.     Mental Status: She is alert and oriented to person, place, and time. Mental status is at baseline.  Psychiatric:        Mood and Affect: Mood normal.        Behavior: Behavior normal.        Thought Content: Thought content normal.        Judgment: Judgment normal.    Assessment/Plan: 1. Impaired fasting glucose A1C today remains at 5.4--discussed A1C levels and normal range, she remains with normal A1C--no need for therapy at this time or routine monitoring May consider retesting if symptoms arise or impaired glucose levels found on  routine blood work - POCT glycosylated hemoglobin (Hb A1C)  2. Essential hypertension BP elevated today--likely due to being without medication Trace MR/TR--metoprolol not indicated  for this, HR is lower end of normal today, she does routinely monitor HR at home and will skip evening dose if HR low Will reassess at CPE in December once she has resumed all therapy--may need to d/c beta blocker for bradycardia and adjust BP therapy for better control - metoprolol tartrate (LOPRESSOR) 50 MG tablet; Take 1 tablet (50 mg total) by mouth 2 (two) times daily.  Dispense: 180 tablet; Refill: 0  3. Primary insomnia Stable at this time with trazodone--requesting refills today - traZODone (DESYREL) 100 MG tablet; Take one tablet each night for sleep.  Dispense: 90 tablet; Refill: 0  4. Encounter for routine adult health examination with abnormal findings Labs ordered for upcoming CPE in December--will adjust plan of care as indicated - Comprehensive Metabolic Panel (CMET) - CBC w/Diff/Platelet - Lipid Panel With LDL/HDL Ratio - TSH + free T4  General Counseling: Bethann verbalizes understanding of the findings of todays visit and agrees with plan of treatment. I have discussed any further diagnostic evaluation that may be needed or ordered today. We also reviewed her medications today. she has been encouraged to call the office with any questions or concerns that should arise related to todays visit.    Orders Placed This Encounter  Procedures  . Comprehensive Metabolic Panel (CMET)  . CBC w/Diff/Platelet  . Lipid Panel With LDL/HDL Ratio  . TSH + free T4  . POCT glycosylated hemoglobin (Hb A1C)    Meds ordered this encounter  Medications  . traZODone (DESYREL) 100 MG tablet    Sig: Take one tablet each night for sleep.    Dispense:  90 tablet    Refill:  0  . metoprolol tartrate (LOPRESSOR) 50 MG tablet    Sig: Take 1 tablet (50 mg total) by mouth 2 (two) times daily.    Dispense:  180  tablet    Refill:  0    Time spent: 30 Minutes Time spent includes review of chart, medications, test results and follow-up plan with the patient.  This patient was seen by Theodoro Grist AGNP-C in Collaboration with Dr Lavera Guise as a part of collaborative care agreement     Tanna Furry. Azael Ragain AGNP-C Internal medicine

## 2020-09-28 ENCOUNTER — Encounter: Payer: Self-pay | Admitting: Hospice and Palliative Medicine

## 2020-10-04 ENCOUNTER — Encounter: Payer: Self-pay | Admitting: Hospice and Palliative Medicine

## 2020-10-04 ENCOUNTER — Ambulatory Visit (INDEPENDENT_AMBULATORY_CARE_PROVIDER_SITE_OTHER): Payer: Medicare Other | Admitting: Hospice and Palliative Medicine

## 2020-10-04 ENCOUNTER — Other Ambulatory Visit: Payer: Self-pay

## 2020-10-04 VITALS — BP 146/100 | HR 58 | Temp 97.9°F | Resp 16 | Ht 67.0 in | Wt 150.6 lb

## 2020-10-04 DIAGNOSIS — R001 Bradycardia, unspecified: Secondary | ICD-10-CM

## 2020-10-04 DIAGNOSIS — E782 Mixed hyperlipidemia: Secondary | ICD-10-CM | POA: Diagnosis not present

## 2020-10-04 DIAGNOSIS — Z0001 Encounter for general adult medical examination with abnormal findings: Secondary | ICD-10-CM | POA: Diagnosis not present

## 2020-10-04 DIAGNOSIS — I10 Essential (primary) hypertension: Secondary | ICD-10-CM | POA: Diagnosis not present

## 2020-10-04 DIAGNOSIS — Z1231 Encounter for screening mammogram for malignant neoplasm of breast: Secondary | ICD-10-CM | POA: Diagnosis not present

## 2020-10-04 DIAGNOSIS — E038 Other specified hypothyroidism: Secondary | ICD-10-CM | POA: Diagnosis not present

## 2020-10-04 DIAGNOSIS — M810 Age-related osteoporosis without current pathological fracture: Secondary | ICD-10-CM | POA: Diagnosis not present

## 2020-10-04 DIAGNOSIS — R300 Dysuria: Secondary | ICD-10-CM | POA: Diagnosis not present

## 2020-10-04 DIAGNOSIS — F5101 Primary insomnia: Secondary | ICD-10-CM

## 2020-10-04 DIAGNOSIS — F411 Generalized anxiety disorder: Secondary | ICD-10-CM

## 2020-10-04 MED ORDER — SERTRALINE HCL 50 MG PO TABS: 50.0000 mg | ORAL_TABLET | Freq: Every day | ORAL | 3 refills | Status: DC

## 2020-10-04 MED ORDER — LISINOPRIL 20 MG PO TABS: ORAL_TABLET | ORAL | 3 refills | Status: DC

## 2020-10-04 MED ORDER — MIRTAZAPINE 7.5 MG PO TABS: 7.5000 mg | ORAL_TABLET | Freq: Every day | ORAL | 1 refills | Status: DC

## 2020-10-04 NOTE — Progress Notes (Signed)
[facility] [place], [place] [address]  Internal MEDICINE  Office Visit Note  Patient Name: Misty Robbins  [id number]  [id number]  Date of Service: 10/08/2020  Chief Complaint  Patient presents with  . Medicare Wellness  . Quality Metric Gaps    Hep C screen, HIV screen, Tdap  . 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 Had an injury to right ankle last year, feels as though she broke her ankle but never had an x-ray--allowed to heal on its own Due to damp weather today she is having quite a bit of pain in ankle and lower back with sciatica pain Takes aleve and acetaminophen that does relieve the pain Has had multiple neck surgeries in the past-- 2012 last sx  Followed and managed by orthopaedics  Has been unable to have her labs done yet--plans to go after this appointment  Struggles with anxiety and sleep--has been taking trazodone for quite some time due to insomnia but she feels as though it is no longer helping She generally goes to bed around 12 am--tosses and turns for several hours due to her mind racing and being unable to stop thinking about things she did not get done that day, will eventually fall asleep but wakes up multiple times throughout the night and struggles going back to sleep--she is willing to try SSRI/SNRI to help manage her anxiety  Blood pressure--discussed elevated reading today and low heart rate, she is hesitant to stop her metoprolol as she has been on it for many years and prior to initiation her resting HR ranged 100-120's, at this time, she takes metoprolol twice daily and lisinopril as needed for elevated BP readings She has noticed headaches lately and this is typically a sign that her BP is running high  PHM: Colonoscopy and upper endoscopy  2019--normal Mammogram--scheduled for January 17th BMD-never completed, s/p hysterectomy 2019, greater risk of falls due to multiple spinal surgeries and chronic pain  Current Medication: Outpatient Encounter Medications as of 10/04/2020  Medication Sig  . albuterol (PROVENTIL HFA;VENTOLIN HFA) 108 (90 Base) MCG/ACT inhaler Inhale into the lungs every 6 (six) hours as needed for wheezing or shortness of breath.  . Calcium Carb-Cholecalciferol (CALCIUM 600+D) 600-800 MG-UNIT TABS Take 1 tablet by mouth daily.  Marland Kitchen ibuprofen (ADVIL) 800 MG tablet Take 1 tablet by mouth as needed.  Marland Kitchen lisinopril (ZESTRIL) 20 MG tablet TAKE 1 TABLET(20 MG) BY MOUTH DAILY  . metoprolol tartrate (LOPRESSOR) 50 MG tablet Take 1 tablet (50 mg total) by mouth 2 (two) times daily.  . traZODone (DESYREL) 100 MG tablet Take one tablet each night for sleep.  . [DISCONTINUED] lisinopril (ZESTRIL) 20 MG tablet TAKE 1 TABLET(20 MG) BY MOUTH DAILY  . mirtazapine (REMERON) 7.5 MG tablet Take 1 tablet (7.5 mg total) by mouth at bedtime.  . sertraline (ZOLOFT) 50 MG tablet Take 1 tablet (50 mg total) by mouth daily.   No facility-administered encounter medications on file as of 10/04/2020.    Surgical History: Past Surgical History:  Procedure Laterality Date  . ABDOMINAL HYSTERECTOMY  2000  . APPENDECTOMY    . BACK SURGERY    . CERVICAL FUSION  [id number]   c2-7  . COLONOSCOPY WITH PROPOFOL N/A 04/13/2018   Procedure: COLONOSCOPY WITH PROPOFOL;  Surgeon: Lucilla Lame, MD;  Location: [facility];  Service: Endoscopy;  Laterality: N/A;  . ESOPHAGOGASTRODUODENOSCOPY (EGD) WITH PROPOFOL N/A 04/13/2018   Procedure: ESOPHAGOGASTRODUODENOSCOPY (EGD) WITH PROPOFOL;  Surgeon: Lucilla Lame, MD;  Location: [facility];  Service: Endoscopy;  Laterality: N/A;  . LAPAROSCOPY N/A 05/25/2018   Procedure: LAPAROSCOPY DIAGNOSTIC;  Surgeon: Ouida Sills [name], MD;  Location: [facility];  Service: Gynecology;  Laterality: N/A;   . LYSIS OF ADHESION N/A 05/25/2018   Procedure: LYSIS OF ADHESION;  Surgeon: Ouida Sills, [name], MD;  Location: [facility];  Service: Gynecology;  Laterality: N/A;  . POLYPECTOMY  04/13/2018   Procedure: POLYPECTOMY;  Surgeon: Lucilla Lame, MD;  Location: [facility];  Service: Endoscopy;;  . SKIN GRAFT  1981   from right thigh to bilateral ankles and right knee  . TONSILLECTOMY  2013  . TRACHELECTOMY N/A 05/25/2018   Procedure: TRACHELECTOMY;  Surgeon: Schermerhorn, [name], MD;  Location: [facility];  Service: Gynecology;  Laterality: N/A;    Medical History: Past Medical History:  Diagnosis Date  . Allergy   . Anxiety   . Arthritis   . Asthma   . Bipolar 1 disorder ([place])   . Chest pain   . Depression   . GERD (gastroesophageal reflux disease)   . History of kidney stones   . Hyperlipidemia   . Hypertension   . Insomnia   . Mitral valve regurgitation   . Osteoporosis   . Peripheral neuropathy   . Tricuspid valve regurgitation     Family History: Family History  Problem Relation Age of Onset  . Breast cancer Paternal Aunt        1's  . Heart disease Mother   . Hypertension Mother   . Stroke Mother   . Stroke Father   . Cancer Father   . Heart disease Father   . Diabetes Sister   . Diabetes Brother       Review of Systems  Constitutional: Negative for chills, diaphoresis and fatigue.  HENT: Negative for ear pain, postnasal drip and sinus pressure.   Eyes: Negative for photophobia, discharge, redness, itching and visual disturbance.  Respiratory: Negative for cough, shortness of breath and wheezing.   Cardiovascular: Negative for chest pain, palpitations and leg swelling.  Gastrointestinal: Negative for abdominal pain, constipation, diarrhea, nausea and vomiting.  Genitourinary: Negative for dysuria and flank pain.  Musculoskeletal: Positive for arthralgias and back pain. Negative for gait problem and neck pain.       Chronic back pain with sciatica   Skin: Negative for color change.  Allergic/Immunologic: Negative for environmental allergies and food allergies.  Neurological: Positive for headaches. Negative for dizziness.  Hematological: Does not bruise/bleed easily.  Psychiatric/Behavioral: Positive for sleep disturbance. Negative for agitation, behavioral problems (depression) and hallucinations.     Vital Signs: BP (!) 146/100 Comment: pt hasn't taken Bp meds today  Pulse (!) 58   Temp 97.9 F (36.6 C)   Resp 16   Ht '5\' 7"'  (1.702 m)   Wt 150 lb 9.6 oz (68.3 kg)   SpO2 99%   BMI 23.59 kg/m    Physical Exam Vitals reviewed.  Constitutional:      Appearance: Normal appearance. She is normal weight.  HENT:     Right Ear: Tympanic membrane normal.     Left Ear: Tympanic membrane normal.     Nose: Nose normal.     Mouth/Throat:     Mouth: Mucous membranes are moist.  Pharynx: Oropharynx is clear.  Eyes:     Pupils: Pupils are equal, round, and reactive to light.  Cardiovascular:     Rate and Rhythm: Regular rhythm. Bradycardia present.     Pulses: Normal pulses.     Heart sounds: Normal heart sounds.  Pulmonary:     Effort: Pulmonary effort is normal.     Breath sounds: Normal breath sounds.  Chest:  Breasts:     Right: Normal.     Left: Normal.    Abdominal:     General: Abdomen is flat.  Musculoskeletal:        General: Normal range of motion.     Cervical back: Normal range of motion.  Skin:    General: Skin is warm.  Neurological:     General: No focal deficit present.     Mental Status: She is alert and oriented to person, place, and time. Mental status is at baseline.  Psychiatric:        Mood and Affect: Mood normal.        Behavior: Behavior normal.        Thought Content: Thought content normal.        Judgment: Judgment normal.    LABS: Recent Results (from the past 2160 hour(s))  POCT glycosylated hemoglobin (Hb A1C)     Status: Normal   Collection Time: 09/25/20  3:07 PM  Result  Value Ref Range   Hemoglobin A1C 5.4 4.0 - 5.6 %   HbA1c POC (<> result, manual entry)     HbA1c, POC (prediabetic range)     HbA1c, POC (controlled diabetic range)    UA/M w/rflx Culture, Routine     Status: None   Collection Time: 10/04/20  8:52 AM   Specimen: Urine   Urine  Result Value Ref Range   Specific Gravity, UA 1.017 1.005 - 1.030   pH, UA 5.5 5.0 - 7.5   Color, UA Yellow Yellow   Appearance Ur Clear Clear   Leukocytes,UA Negative Negative   Protein,UA Negative Negative/Trace   Glucose, UA Negative Negative   Ketones, UA Negative Negative   RBC, UA Negative Negative   Bilirubin, UA Negative Negative   Urobilinogen, Ur 0.2 0.2 - 1.0 mg/dL   Nitrite, UA Negative Negative   Microscopic Examination Comment     Comment: Microscopic follows if indicated.   Microscopic Examination See below:     Comment: Microscopic was indicated and was performed.   Urinalysis Reflex Comment     Comment: This specimen will not reflex to a Urine Culture.  Microscopic Examination     Status: None   Collection Time: 10/04/20  8:52 AM   Urine  Result Value Ref Range   WBC, UA None seen 0 - 5 /hpf   RBC None seen 0 - 2 /hpf   Epithelial Cells (non renal) 0-10 0 - 10 /hpf   Casts None seen None seen /lpf   Bacteria, UA None seen None seen/Few  Comprehensive Metabolic Panel (CMET)     Status: None   Collection Time: 10/04/20  9:56 AM  Result Value Ref Range   Glucose 85 65 - 99 mg/dL   BUN 9 6 - 24 mg/dL   Creatinine, Ser 0.87 0.57 - 1.00 mg/dL   GFR calc non Af Amer 80 >59 mL/min/1.73   GFR calc Af Amer 92 >59 mL/min/1.73    Comment: **In accordance with recommendations from the NKF-ASN Task force,**   [facility] is in the process of updating  its eGFR calculation to the   2021 CKD-EPI creatinine equation that estimates kidney function   without a race variable.    BUN/Creatinine Ratio 10 9 - 23   Sodium 140 134 - 144 mmol/L   Potassium 4.4 3.5 - 5.2 mmol/L   Chloride 105 96 - 106  mmol/L   CO2 23 20 - 29 mmol/L   Calcium 8.8 8.7 - 10.2 mg/dL   Total Protein 6.5 6.0 - 8.5 g/dL   Albumin 3.8 3.8 - 4.8 g/dL   Globulin, Total 2.7 1.5 - 4.5 g/dL   Albumin/Globulin Ratio 1.4 1.2 - 2.2   Bilirubin Total <0.2 0.0 - 1.2 mg/dL   Alkaline Phosphatase 99 44 - 121 IU/L    Comment:               **Please note reference interval change**   AST 15 0 - 40 IU/L   ALT 15 0 - 32 IU/L  CBC w/Diff/Platelet     Status: None   Collection Time: 10/04/20  9:56 AM  Result Value Ref Range   WBC 6.8 3.4 - 10.8 x10E3/uL   RBC 4.15 3.77 - 5.28 x10E6/uL   Hemoglobin 12.6 11.1 - 15.9 g/dL   Hematocrit 37.7 34.0 - 46.6 %   MCV 91 79 - 97 fL   MCH 30.4 26.6 - 33.0 pg   MCHC 33.4 31.5 - 35.7 g/dL   RDW 12.4 11.7 - 15.4 %   Platelets 208 150 - 450 x10E3/uL   Neutrophils 42 Not Estab. %   Lymphs 41 Not Estab. %   Monocytes 10 Not Estab. %   Eos 6 Not Estab. %   Basos 1 Not Estab. %   Neutrophils Absolute 2.9 1.4 - 7.0 x10E3/uL   Lymphocytes Absolute 2.8 0.7 - 3.1 x10E3/uL   Monocytes Absolute 0.7 0.1 - 0.9 x10E3/uL   EOS (ABSOLUTE) 0.4 0.0 - 0.4 x10E3/uL   Basophils Absolute 0.1 0.0 - 0.2 x10E3/uL   Immature Granulocytes 0 Not Estab. %   Immature Grans (Abs) 0.0 0.0 - 0.1 x10E3/uL  Lipid Panel With LDL/HDL Ratio     Status: Abnormal   Collection Time: 10/04/20  9:56 AM  Result Value Ref Range   Cholesterol, Total 201 (H) 100 - 199 mg/dL   Triglycerides 218 (H) 0 - 149 mg/dL   HDL 36 (L) >39 mg/dL   VLDL Cholesterol Cal 39 5 - 40 mg/dL   LDL Chol Calc (NIH) 126 (H) 0 - 99 mg/dL   LDL/HDL Ratio 3.5 (H) 0.0 - 3.2 ratio    Comment:                                     LDL/HDL Ratio                                             Men  Women                               1/2 Avg.Risk  1.0    1.5                                   Avg.Risk  3.6    3.2                                2X Avg.Risk  6.2    5.0                                3X Avg.Risk  8.0    6.1   TSH + free T4     Status: None    Collection Time: 10/04/20  9:56 AM  Result Value Ref Range   TSH 0.677 0.450 - 4.500 uIU/mL   Free T4 1.21 0.82 - 1.77 ng/dL    Assessment/Plan: 1. Encounter for routine adult health examination with abnormal findings Well appearing 49 year old female Encouraged to have her labs drawn for review Orders placed today to update PHM  2. Encounter for screening mammogram for malignant neoplasm of breast Scheduled for January 17th - MM 3D SCREEN BREAST BILATERAL; Future  3. Post-menopausal osteoporosis Increased risk of falls as well as fracture due to post-menopause as well as chronic pain and multiple back surgeries - DG Bone Density; Future  4. Bradycardia Advised to drop metoprolol dose to once daily--hold for HR less than 60  5. Essential hypertension Advised to start taking lisinopril daily, no longer as needed Will monitor response to daily use and adjust therapy as needed - lisinopril (ZESTRIL) 20 MG tablet; TAKE 1 TABLET(20 MG) BY MOUTH DAILY  Dispense: 30 tablet; Refill: 3  6. Primary insomnia Stop trazodone, start Remeron 7.5 mg at bedtime, may increase to 15 mg at bedtime as needed for insomnia - mirtazapine (REMERON) 7.5 MG tablet; Take 1 tablet (7.5 mg total) by mouth at bedtime.  Dispense: 30 tablet; Refill: 1  7. GAD (generalized anxiety disorder) Start Zoloft for symptoms of anxiety at night contributing to insomnia - sertraline (ZOLOFT) 50 MG tablet; Take 1 tablet (50 mg total) by mouth daily.  Dispense: 30 tablet; Refill: 3  8. Dysuria - UA/M w/rflx Culture, Routine - Microscopic Examination  General Counseling: Caedence verbalizes understanding of the findings of todays visit and agrees with plan of treatment. I have discussed any further diagnostic evaluation that may be needed or ordered today. We also reviewed her medications today. she has been encouraged to call the office with any questions or concerns that should arise related to todays  visit.    Counseling: Hypertension Counseling:   The following hypertensive lifestyle modification were recommended and discussed:  1. Limiting alcohol intake to less than 1 oz/day of ethanol:(24 oz of beer or 8 oz of wine or 2 oz of 100-proof whiskey). 2. Take baby ASA 81 mg daily. 3. Importance of regular aerobic exercise and losing weight. 4. Reduce dietary saturated fat and cholesterol intake for overall cardiovascular health. 5. Maintaining adequate dietary potassium, calcium, and magnesium intake. 6. Regular monitoring of the blood pressure. 7. Reduce sodium intake to less than 100 mmol/day (less than 2.3 gm of sodium or less than 6 gm of sodium choride)    Orders Placed This Encounter  Procedures  . Microscopic Examination  . DG Bone Density  . MM 3D SCREEN BREAST BILATERAL  . UA/M w/rflx Culture, Routine    Meds ordered this encounter  Medications  . sertraline (ZOLOFT) 50 MG tablet    Sig: Take 1 tablet (50 mg total) by mouth daily.    Dispense:  30  tablet    Refill:  3  . mirtazapine (REMERON) 7.5 MG tablet    Sig: Take 1 tablet (7.5 mg total) by mouth at bedtime.    Dispense:  30 tablet    Refill:  1  . lisinopril (ZESTRIL) 20 MG tablet    Sig: TAKE 1 TABLET(20 MG) BY MOUTH DAILY    Dispense:  30 tablet    Refill:  3    Total time spent: 30 Minutes  Time spent includes review of chart, medications, test results, and follow up plan with the patient.   This patient was seen by Casey Burkitt AGNP-C Collaboration with Dr Lavera Guise as a part of collaborative care agreement   Tanna Furry. [facility] Medicine

## 2020-10-05 LAB — UA/M W/RFLX CULTURE, ROUTINE
Bilirubin, UA: NEGATIVE
Glucose, UA: NEGATIVE
Ketones, UA: NEGATIVE
Leukocytes,UA: NEGATIVE
Nitrite, UA: NEGATIVE
Protein,UA: NEGATIVE
RBC, UA: NEGATIVE
Specific Gravity, UA: 1.017 (ref 1.005–1.030)
Urobilinogen, Ur: 0.2 mg/dL (ref 0.2–1.0)
pH, UA: 5.5 (ref 5.0–7.5)

## 2020-10-05 LAB — CBC WITH DIFFERENTIAL/PLATELET
Basophils Absolute: 0.1 10*3/uL (ref 0.0–0.2)
Basos: 1 %
EOS (ABSOLUTE): 0.4 10*3/uL (ref 0.0–0.4)
Eos: 6 %
Hematocrit: 37.7 % (ref 34.0–46.6)
Hemoglobin: 12.6 g/dL (ref 11.1–15.9)
Immature Grans (Abs): 0 10*3/uL (ref 0.0–0.1)
Immature Granulocytes: 0 %
Lymphocytes Absolute: 2.8 10*3/uL (ref 0.7–3.1)
Lymphs: 41 %
MCH: 30.4 pg (ref 26.6–33.0)
MCHC: 33.4 g/dL (ref 31.5–35.7)
MCV: 91 fL (ref 79–97)
Monocytes Absolute: 0.7 10*3/uL (ref 0.1–0.9)
Monocytes: 10 %
Neutrophils Absolute: 2.9 10*3/uL (ref 1.4–7.0)
Neutrophils: 42 %
Platelets: 208 10*3/uL (ref 150–450)
RBC: 4.15 x10E6/uL (ref 3.77–5.28)
RDW: 12.4 % (ref 11.7–15.4)
WBC: 6.8 10*3/uL (ref 3.4–10.8)

## 2020-10-05 LAB — COMPREHENSIVE METABOLIC PANEL
ALT: 15 IU/L (ref 0–32)
AST: 15 IU/L (ref 0–40)
Albumin/Globulin Ratio: 1.4 (ref 1.2–2.2)
Albumin: 3.8 g/dL (ref 3.8–4.8)
Alkaline Phosphatase: 99 IU/L (ref 44–121)
BUN/Creatinine Ratio: 10 (ref 9–23)
BUN: 9 mg/dL (ref 6–24)
Bilirubin Total: <0.2 mg/dL (ref 0.0–1.2)
CO2: 23 mmol/L (ref 20–29)
Calcium: 8.8 mg/dL (ref 8.7–10.2)
Chloride: 105 mmol/L (ref 96–106)
Creatinine, Ser: 0.87 mg/dL (ref 0.57–1.00)
GFR calc Af Amer: 92 mL/min/{1.73_m2} (ref >59)
GFR calc non Af Amer: 80 mL/min/{1.73_m2} (ref >59)
Globulin, Total: 2.7 g/dL (ref 1.5–4.5)
Glucose: 85 mg/dL (ref 65–99)
Potassium: 4.4 mmol/L (ref 3.5–5.2)
Sodium: 140 mmol/L (ref 134–144)
Total Protein: 6.5 g/dL (ref 6.0–8.5)

## 2020-10-05 LAB — LIPID PANEL WITH LDL/HDL RATIO
Cholesterol, Total: 201 mg/dL — ABNORMAL HIGH (ref 100–199)
HDL: 36 mg/dL — ABNORMAL LOW (ref >39)
LDL Chol Calc (NIH): 126 mg/dL — ABNORMAL HIGH (ref 0–99)
LDL/HDL Ratio: 3.5 ratio — ABNORMAL HIGH (ref 0.0–3.2)
Triglycerides: 218 mg/dL — ABNORMAL HIGH (ref 0–149)
VLDL Cholesterol Cal: 39 mg/dL (ref 5–40)

## 2020-10-05 LAB — MICROSCOPIC EXAMINATION
Bacteria, UA: NONE SEEN
Casts: NONE SEEN /lpf
RBC, Urine: NONE SEEN /hpf (ref 0–2)
WBC, UA: NONE SEEN /hpf (ref 0–5)

## 2020-10-05 LAB — TSH+FREE T4
Free T4: 1.21 ng/dL (ref 0.82–1.77)
TSH: 0.677 u[IU]/mL (ref 0.450–4.500)

## 2020-10-08 ENCOUNTER — Encounter: Payer: Self-pay | Admitting: Hospice and Palliative Medicine

## 2020-10-12 ENCOUNTER — Other Ambulatory Visit: Payer: Self-pay | Admitting: Hospice and Palliative Medicine

## 2020-10-12 MED ORDER — LISINOPRIL-HYDROCHLOROTHIAZIDE 20-25 MG PO TABS: 1.0000 | ORAL_TABLET | Freq: Every day | ORAL | 3 refills | Status: DC

## 2020-10-12 NOTE — Progress Notes (Signed)
Patient called and reported elevated BP--160-170/100-110 as well as headaches and dizziness. Unclear as to how she is taking her BP medication, seems to be taking too many doses and possibly having rebound HTN. HR 60 at this time. Advised I will be adding HCTZ to lisinopril and she is to take this medication once a day in the AM. Continue to hold metoprolol if HR 60 or below. Advised to start new medication and call office tomorrow with BP reading.

## 2020-10-31 ENCOUNTER — Other Ambulatory Visit: Payer: Self-pay

## 2020-11-01 ENCOUNTER — Other Ambulatory Visit: Payer: Self-pay

## 2020-11-01 MED ORDER — ESTROGENS CONJUGATED 1.25 MG PO TABS
1.2500 mg | ORAL_TABLET | Freq: Every day | ORAL | 1 refills | Status: DC
Start: 2020-11-01 — End: 2021-02-01

## 2020-11-13 ENCOUNTER — Other Ambulatory Visit: Payer: Medicare Other

## 2020-11-15 ENCOUNTER — Ambulatory Visit: Payer: Medicaid Other | Admitting: Hospice and Palliative Medicine

## 2020-11-22 ENCOUNTER — Ambulatory Visit
Admission: RE | Admit: 2020-11-22 | Discharge: 2020-11-22 | Disposition: A | Discharge status: Home / Self Care | Payer: Medicare Other | Source: Ambulatory Visit | Attending: Hospice and Palliative Medicine | Admitting: Hospice and Palliative Medicine

## 2020-11-22 ENCOUNTER — Other Ambulatory Visit: Payer: Self-pay

## 2020-11-22 DIAGNOSIS — M810 Age-related osteoporosis without current pathological fracture: Secondary | ICD-10-CM | POA: Insufficient documentation

## 2020-11-22 DIAGNOSIS — Z1231 Encounter for screening mammogram for malignant neoplasm of breast: Secondary | ICD-10-CM | POA: Insufficient documentation

## 2020-11-22 DIAGNOSIS — Z780 Asymptomatic menopausal state: Secondary | ICD-10-CM | POA: Diagnosis not present

## 2020-11-22 DIAGNOSIS — Z8739 Personal history of other diseases of the musculoskeletal system and connective tissue: Secondary | ICD-10-CM | POA: Diagnosis not present

## 2020-11-22 DIAGNOSIS — Z87828 Personal history of other (healed) physical injury and trauma: Secondary | ICD-10-CM | POA: Diagnosis not present

## 2020-11-23 NOTE — Progress Notes (Signed)
Normal BMD--discuss at next visit.

## 2020-12-04 ENCOUNTER — Ambulatory Visit: Payer: Medicaid Other | Admitting: Physician Assistant

## 2020-12-23 ENCOUNTER — Other Ambulatory Visit: Payer: Self-pay | Admitting: Hospice and Palliative Medicine

## 2020-12-23 DIAGNOSIS — I10 Essential (primary) hypertension: Secondary | ICD-10-CM

## 2020-12-23 DIAGNOSIS — F5101 Primary insomnia: Secondary | ICD-10-CM

## 2020-12-23 NOTE — Telephone Encounter (Signed)
Pt will need to be seen in June for 6 month routine follow up, ok to refill for next 3 months

## 2021-01-01 ENCOUNTER — Other Ambulatory Visit: Payer: Self-pay | Admitting: Hospice and Palliative Medicine

## 2021-01-01 DIAGNOSIS — I10 Essential (primary) hypertension: Secondary | ICD-10-CM

## 2021-01-02 ENCOUNTER — Other Ambulatory Visit: Payer: Self-pay | Admitting: Internal Medicine

## 2021-01-02 DIAGNOSIS — F5101 Primary insomnia: Secondary | ICD-10-CM

## 2021-02-01 ENCOUNTER — Other Ambulatory Visit: Payer: Self-pay | Admitting: Hospice and Palliative Medicine

## 2021-02-01 NOTE — Telephone Encounter (Signed)
??   High  dose HRT.

## 2021-02-19 ENCOUNTER — Other Ambulatory Visit: Payer: Self-pay | Admitting: Internal Medicine

## 2021-02-23 ENCOUNTER — Encounter: Payer: Self-pay | Admitting: Physician Assistant

## 2021-02-23 ENCOUNTER — Ambulatory Visit (INDEPENDENT_AMBULATORY_CARE_PROVIDER_SITE_OTHER): Payer: Medicare Other | Admitting: Physician Assistant

## 2021-02-23 ENCOUNTER — Other Ambulatory Visit: Payer: Self-pay

## 2021-02-23 DIAGNOSIS — F411 Generalized anxiety disorder: Secondary | ICD-10-CM

## 2021-02-23 DIAGNOSIS — R059 Cough, unspecified: Secondary | ICD-10-CM | POA: Diagnosis not present

## 2021-02-23 DIAGNOSIS — F5101 Primary insomnia: Secondary | ICD-10-CM | POA: Diagnosis not present

## 2021-02-23 DIAGNOSIS — I10 Essential (primary) hypertension: Secondary | ICD-10-CM | POA: Diagnosis not present

## 2021-02-23 DIAGNOSIS — E782 Mixed hyperlipidemia: Secondary | ICD-10-CM

## 2021-02-23 NOTE — Progress Notes (Signed)
[facility] [place], [place] [address]  Internal MEDICINE  Office Visit Note  Patient Name: Misty Robbins  [id number]  [id number]  Date of Service: 03/04/2021  Chief Complaint  Patient presents with  . Follow-up    Cough with mucus and fleam, knees buckle when walking because of spinal issues   . Depression  . Gastroesophageal Reflux  . Hyperlipidemia  . Hypertension  . Anxiety  . Asthma    HPI Pt is here for follow up. -Labs reviewed showing elevated cholesterol. Pt will work on diet and exercise and consider statin in future if not improving. -Mammogram and BMD results were also reviewed and found to be normal -Pt states she has had a cough for awhile since being sick 1-2 months ago. No SOB or wheezing, and feels otherwise well.  -BP at home has been well controlled, taking metoprolol and lisinopril, but has not taken her med yet today. -Mirtazpine did not work so went back to trazodone and helps calm her mind to sleep. -tried sertraline for awhile but not taking it anymore and feels she is doing well.  Current Medication: Outpatient Encounter Medications as of 02/23/2021  Medication Sig  . albuterol (PROVENTIL HFA;VENTOLIN HFA) 108 (90 Base) MCG/ACT inhaler Inhale into the lungs every 6 (six) hours as needed for wheezing or shortness of breath.  . Calcium Carb-Cholecalciferol (CALCIUM 600+D) 600-800 MG-UNIT TABS Take 1 tablet by mouth daily.  Marland Kitchen ibuprofen (ADVIL) 800 MG tablet Take 1 tablet by mouth as needed.  Marland Kitchen lisinopril (ZESTRIL) 20 MG tablet TAKE 1 TABLET(20 MG) BY MOUTH DAILY  . lisinopril-hydrochlorothiazide (ZESTORETIC) 20-25 MG tablet Take 1 tablet by mouth daily.  . metoprolol tartrate (LOPRESSOR) 50 MG tablet TAKE 1 TABLET(50 MG) BY MOUTH TWICE DAILY  . PREMARIN 1.25 MG tablet TAKE 1 TABLET(1.25 MG) BY MOUTH DAILY  . traZODone (DESYREL) 100 MG tablet TAKE 1 TABLET BY MOUTH EVERY NIGHT FOR SLEEP  . [DISCONTINUED] mirtazapine (REMERON) 7.5  MG tablet Take 1 tablet (7.5 mg total) by mouth at bedtime.  . [DISCONTINUED] sertraline (ZOLOFT) 50 MG tablet Take 1 tablet (50 mg total) by mouth daily.   No facility-administered encounter medications on file as of 02/23/2021.    Surgical History: Past Surgical History:  Procedure Laterality Date  . ABDOMINAL HYSTERECTOMY  2000  . APPENDECTOMY    . BACK SURGERY    . CERVICAL FUSION  [id number]   c2-7  . COLONOSCOPY WITH PROPOFOL N/A 04/13/2018   Procedure: COLONOSCOPY WITH PROPOFOL;  Surgeon: Lucilla Lame, MD;  Location: [place];  Service: Endoscopy;  Laterality: N/A;  . ESOPHAGOGASTRODUODENOSCOPY (EGD) WITH PROPOFOL N/A 04/13/2018   Procedure: ESOPHAGOGASTRODUODENOSCOPY (EGD) WITH PROPOFOL;  Surgeon: Lucilla Lame, MD;  Location: [place];  Service: Endoscopy;  Laterality: N/A;  . LAPAROSCOPY N/A 05/25/2018   Procedure: LAPAROSCOPY DIAGNOSTIC;  Surgeon: Ouida Sills [name], MD;  Location: [facility];  Service: Gynecology;  Laterality: N/A;  . LYSIS OF ADHESION N/A 05/25/2018   Procedure: LYSIS OF ADHESION;  Surgeon: Ouida Sills, [name], MD;  Location: [facility];  Service: Gynecology;  Laterality: N/A;  . POLYPECTOMY  04/13/2018   Procedure: POLYPECTOMY;  Surgeon: Lucilla Lame, MD;  Location: [place];  Service: Endoscopy;;  . SKIN GRAFT  1981   from right thigh to bilateral ankles and right knee  . TONSILLECTOMY  2013  . TRACHELECTOMY N/A 05/25/2018   Procedure: TRACHELECTOMY;  Surgeon: Schermerhorn, [name], MD;  Location: [facility];  Service: Gynecology;  Laterality: N/A;    Medical History: Past Medical History:  Diagnosis Date  . Allergy   . Anxiety   . Arthritis   . Asthma   . Bipolar 1 disorder ([place])   . Chest pain   . Depression   . GERD (gastroesophageal reflux disease)   . History of kidney stones   . Hyperlipidemia   . Hypertension   . Insomnia   . Mitral valve regurgitation   . Osteoporosis   . Peripheral neuropathy   .  Tricuspid valve regurgitation     Family History: Family History  Problem Relation Age of Onset  . Breast cancer Paternal Aunt        15's  . Heart disease Mother   . Hypertension Mother   . Stroke Mother   . Stroke Father   . Cancer Father   . Heart disease Father   . Diabetes Sister   . Diabetes Brother     Social History   Socioeconomic History  . Marital status: Married    Spouse name: Not on file  . Number of children: Not on file  . Years of education: Not on file  . Highest education level: Not on file  Occupational History  . Not on file  Tobacco Use  . Smoking status: Former Smoker    Packs/day: 0.50    Years: 25.00    Pack years: 12.50    Types: Cigarettes  . Smokeless tobacco: Never Used  . Tobacco comment: not since september 30  Vaping Use  . Vaping Use: Never used  Substance and Sexual Activity  . Alcohol use: Not Currently  . Drug use: No  . Sexual activity: Not on file  Other Topics Concern  . Not on file  Social History Narrative  . Not on file   Social Determinants of Health   Financial Resource Strain: Not on file  Food Insecurity: Not on file  Transportation Needs: Not on file  Physical Activity: Not on file  Stress: Not on file  Social Connections: Not on file  Intimate Partner Violence: Not on file      Review of Systems  Constitutional: Negative for chills, fatigue and unexpected weight change.  HENT: Negative for congestion, postnasal drip, rhinorrhea, sneezing and sore throat.   Eyes: Negative for redness.  Respiratory: Positive for cough. Negative for chest tightness and shortness of breath.   Cardiovascular: Negative for chest pain and palpitations.  Gastrointestinal: Negative for abdominal pain, constipation, diarrhea, nausea and vomiting.  Genitourinary: Negative for dysuria and frequency.  Musculoskeletal: Positive for back pain. Negative for arthralgias, joint swelling and neck pain.  Skin: Negative for rash.   Neurological: Negative.  Negative for tremors and numbness.  Hematological: Negative for adenopathy. Does not bruise/bleed easily.  Psychiatric/Behavioral: Negative for behavioral problems (Depression), sleep disturbance and suicidal ideas. The patient is nervous/anxious.     Vital Signs: BP (!) 144/90   Pulse 70   Temp 97.7 F (36.5 C)   Resp 16   Ht 5\' 7"  (1.702 m)   Wt 150 lb 3.2 oz (68.1 kg)   SpO2 99%   BMI 23.52 kg/m    Physical Exam Vitals and nursing note reviewed.  Constitutional:      General: She is not in acute distress.    Appearance: She is well-developed and normal weight. She is not diaphoretic.  HENT:     Head: Normocephalic and atraumatic.     Mouth/Throat:     Pharynx: No oropharyngeal exudate.  Eyes:     Pupils: Pupils are equal, round, and reactive to light.  Neck:     Thyroid: No thyromegaly.     Vascular: No JVD.     Trachea: No tracheal deviation.  Cardiovascular:     Rate and Rhythm: Normal rate and regular rhythm.     Heart sounds: Normal heart sounds. No murmur heard. No friction rub. No gallop.   Pulmonary:     Effort: Pulmonary effort is normal. No respiratory distress.     Breath sounds: No wheezing or rales.  Chest:     Chest wall: No tenderness.  Abdominal:     General: Bowel sounds are normal.     Palpations: Abdomen is soft.  Musculoskeletal:        General: Normal range of motion.     Cervical back: Normal range of motion and neck supple.  Lymphadenopathy:     Cervical: No cervical adenopathy.  Skin:    General: Skin is warm and dry.  Neurological:     Mental Status: She is alert and oriented to person, place, and time.     Cranial Nerves: No cranial nerve deficit.  Psychiatric:        Behavior: Behavior normal.        Thought Content: Thought content normal.        Judgment: Judgment normal.        Assessment/Plan: 1. Essential hypertension Slightly elevated in office but pt hadnt taken her medication yet,  reports well controlled at home--continue metoprolol and lisinopril  2. Mixed hyperlipidemia Improve diet and exercise, will need to consider statin therapy in future if not improving  3. GAD (generalized anxiety disorder) No longer taking zoloft and feel her anxiety is stable and does not want to start another medication.  4. Primary insomnia Continue trazodone nightly  5. Cough Residual cough from a cold a month ago, will monitor and call if not improving. No other symptoms na dlungs clear on exam. If continues to be chronic consider ACEI as potential cause.   General Counseling: Alisse verbalizes understanding of the findings of todays visit and agrees with plan of treatment. I have discussed any further diagnostic evaluation that may be needed or ordered today. We also reviewed her medications today. she has been encouraged to call the office with any questions or concerns that should arise related to todays visit.    No orders of the defined types were placed in this encounter.   No orders of the defined types were placed in this encounter.   This patient was seen by Drema Dallas, PA-C in collaboration with Dr. Clayborn Bigness as a part of collaborative care agreement.   Total time spent:30 Minutes Time spent includes review of chart, medications, test results, and follow up plan with the patient.      Dr Lavera Guise Internal medicine

## 2021-03-19 ENCOUNTER — Other Ambulatory Visit: Payer: Self-pay

## 2021-03-19 MED ORDER — PREDNISONE 10 MG (21) PO TBPK: ORAL_TABLET | ORAL | 0 refills | Status: DC

## 2021-03-19 NOTE — Telephone Encounter (Signed)
Pt called requesting a pred taper for back pain.  Per Ander Purpura I sent a pred taper for 6 days to pharmacy.  Pt informed that in future we will have to see her for the back pain and if she needs further testing done.

## 2021-03-21 ENCOUNTER — Other Ambulatory Visit: Payer: Self-pay | Admitting: Nurse Practitioner

## 2021-03-21 DIAGNOSIS — I10 Essential (primary) hypertension: Secondary | ICD-10-CM

## 2021-03-22 ENCOUNTER — Other Ambulatory Visit: Payer: Self-pay | Admitting: Internal Medicine

## 2021-03-22 DIAGNOSIS — I10 Essential (primary) hypertension: Secondary | ICD-10-CM

## 2021-03-24 ENCOUNTER — Other Ambulatory Visit: Payer: Self-pay

## 2021-03-24 MED ORDER — ESTROGENS CONJUGATED 1.25 MG PO TABS: ORAL_TABLET | ORAL | 3 refills | Status: DC

## 2021-04-02 ENCOUNTER — Encounter: Payer: Self-pay | Admitting: Internal Medicine

## 2021-04-02 ENCOUNTER — Other Ambulatory Visit: Payer: Self-pay

## 2021-04-02 ENCOUNTER — Ambulatory Visit (INDEPENDENT_AMBULATORY_CARE_PROVIDER_SITE_OTHER): Payer: Medicare Other | Admitting: Internal Medicine

## 2021-04-02 DIAGNOSIS — F5101 Primary insomnia: Secondary | ICD-10-CM | POA: Diagnosis not present

## 2021-04-02 DIAGNOSIS — I10 Essential (primary) hypertension: Secondary | ICD-10-CM

## 2021-04-02 DIAGNOSIS — R252 Cramp and spasm: Secondary | ICD-10-CM

## 2021-04-02 DIAGNOSIS — M48062 Spinal stenosis, lumbar region with neurogenic claudication: Secondary | ICD-10-CM | POA: Diagnosis not present

## 2021-04-02 DIAGNOSIS — M48061 Spinal stenosis, lumbar region without neurogenic claudication: Secondary | ICD-10-CM

## 2021-04-02 DIAGNOSIS — G8929 Other chronic pain: Secondary | ICD-10-CM

## 2021-04-02 MED ORDER — TRAZODONE HCL 100 MG PO TABS: ORAL_TABLET | ORAL | 0 refills | Status: DC

## 2021-04-02 MED ORDER — CELECOXIB 200 MG PO CAPS: 200.0000 mg | ORAL_CAPSULE | Freq: Every day | ORAL | 0 refills | Status: DC

## 2021-04-02 MED ORDER — CYCLOBENZAPRINE HCL 10 MG PO TABS: 10.0000 mg | ORAL_TABLET | Freq: Every day | ORAL | 1 refills | Status: DC

## 2021-04-02 MED ORDER — DULOXETINE HCL 20 MG PO CPEP
20.0000 mg | ORAL_CAPSULE | Freq: Every day | ORAL | 3 refills | Status: DC
Start: 2021-04-02 — End: 2021-07-26

## 2021-04-02 MED ORDER — GABAPENTIN 100 MG PO CAPS: 100.0000 mg | ORAL_CAPSULE | Freq: Every day | ORAL | 3 refills | Status: DC

## 2021-04-02 MED ORDER — PREDNISONE 10 MG (21) PO TBPK: ORAL_TABLET | ORAL | 0 refills | Status: DC

## 2021-04-02 MED ORDER — LISINOPRIL 20 MG PO TABS: ORAL_TABLET | ORAL | 1 refills | Status: DC

## 2021-04-02 MED ORDER — LISINOPRIL-HYDROCHLOROTHIAZIDE 20-12.5 MG PO TABS: 1.0000 | ORAL_TABLET | Freq: Every day | ORAL | 3 refills | Status: DC

## 2021-04-02 NOTE — Progress Notes (Signed)
[facility] [place], [place] [address]  Internal MEDICINE  Office Visit Note  Patient Name: Misty Robbins  [id number]  [id number]  Date of Service: 04/11/2021  Chief Complaint  Patient presents with   Follow-up    Back pain, leg cramp due to medication   Depression   Gastroesophageal Reflux   Hyperlipidemia   Hypertension   Anxiety   Asthma    HPI Patient is here for acute and sick visit patient suffers from chronic back pain and due to spinal stenosis. She has seen orthopedic and neurosurgery in the past and even had epidural injections, her most recent MRI of LS spine was from 2020. According to her the only thing that helps is prednisone she did have her BMD this year and her bone density was normal. Patient also has hypertension and takes 2 different lisinopril for blood pressure control patient is on lisinopril 20 mg along with lisinopril HCTZ 20/12.5 once a day patient has not been on any muscle relaxers or NSAIDs or neuromodulators like Neurontin in the past she thinks she did try Lyrica with no relief.  IMPRESSION: 1. Stable MR appearance of the lumbar spine. 2. Early/mild stable spinal and lateral recess stenosis at L4-5. 3. Stable small and shallow right foraminal disc protrusion at L3-4. Patient does feel depressed at times due to her pain, she also has problems sleeping at night Current Medication: Outpatient Encounter Medications as of 04/02/2021  Medication Sig   celecoxib (CELEBREX) 200 MG capsule Take 1 capsule (200 mg total) by mouth daily. For arthritis and back pain   cyclobenzaprine (FLEXERIL) 10 MG tablet Take 1 tablet (10 mg total) by mouth at bedtime. Take one tab po qhs for back spasm prn only   DULoxetine (CYMBALTA) 20 MG capsule Take 1 capsule (20 mg total) by mouth daily.   gabapentin (NEURONTIN) 100 MG capsule Take 1 capsule (100 mg total) by mouth at bedtime. And increase to 2 tabs after 3 days and then take 3 tabs at bed  time   lisinopril (ZESTRIL) 20 MG tablet Take one tab at night for bp ( also has lisinopril hctz ) takes both   lisinopril-hydrochlorothiazide (ZESTORETIC) 20-12.5 MG tablet Take 1 tablet by mouth daily.   albuterol (PROVENTIL HFA;VENTOLIN HFA) 108 (90 Base) MCG/ACT inhaler Inhale into the lungs every 6 (six) hours as needed for wheezing or shortness of breath.   Calcium Carb-Cholecalciferol (CALCIUM 600+D) 600-800 MG-UNIT TABS Take 1 tablet by mouth daily.   estrogens, conjugated, (PREMARIN) 1.25 MG tablet TAKE 1 TABLET(1.25 MG) BY MOUTH DAILY   metoprolol tartrate (LOPRESSOR) 50 MG tablet TAKE 1 TABLET(50 MG) BY MOUTH TWICE DAILY   predniSONE (STERAPRED UNI-PAK 21 TAB) 10 MG (21) TBPK tablet Take as directed for 6 days   traZODone (DESYREL) 100 MG tablet Take one tab po qhs for sleep   [DISCONTINUED] ibuprofen (ADVIL) 800 MG tablet Take 1 tablet by mouth as needed.   [DISCONTINUED] lisinopril (ZESTRIL) 20 MG tablet TAKE 1 TABLET(20 MG) BY MOUTH DAILY   [DISCONTINUED] lisinopril-hydrochlorothiazide (ZESTORETIC) 20-25 MG tablet Take 1 tablet by mouth daily.   [DISCONTINUED] predniSONE (STERAPRED UNI-PAK 21 TAB) 10 MG (21) TBPK tablet Take as directed for 6 days   [DISCONTINUED] traZODone (DESYREL) 100 MG tablet TAKE 1 TABLET BY MOUTH EVERY NIGHT FOR SLEEP   No facility-administered encounter medications on file as of 04/02/2021.    Surgical History: Past Surgical History:  Procedure Laterality Date   ABDOMINAL HYSTERECTOMY  2000  APPENDECTOMY     BACK SURGERY     CERVICAL FUSION  2011,2012   c2-7   COLONOSCOPY WITH PROPOFOL N/A 04/13/2018   Procedure: COLONOSCOPY WITH PROPOFOL;  Surgeon: Lucilla Lame, MD;  Location: [place];  Service: Endoscopy;  Laterality: N/A;   ESOPHAGOGASTRODUODENOSCOPY (EGD) WITH PROPOFOL N/A 04/13/2018   Procedure: ESOPHAGOGASTRODUODENOSCOPY (EGD) WITH PROPOFOL;  Surgeon: Lucilla Lame, MD;  Location: [place];  Service: Endoscopy;  Laterality:  N/A;   LAPAROSCOPY N/A 05/25/2018   Procedure: LAPAROSCOPY DIAGNOSTIC;  Surgeon: Ouida Sills [name], MD;  Location: [facility];  Service: Gynecology;  Laterality: N/A;   LYSIS OF ADHESION N/A 05/25/2018   Procedure: LYSIS OF ADHESION;  Surgeon: Schermerhorn, [name], MD;  Location: [facility];  Service: Gynecology;  Laterality: N/A;   POLYPECTOMY  04/13/2018   Procedure: POLYPECTOMY;  Surgeon: Lucilla Lame, MD;  Location: [place];  Service: Endoscopy;;   SKIN GRAFT  1981   from right thigh to bilateral ankles and right knee   TONSILLECTOMY  2013   TRACHELECTOMY N/A 05/25/2018   Procedure: TRACHELECTOMY;  Surgeon: Schermerhorn, [name], MD;  Location: [facility];  Service: Gynecology;  Laterality: N/A;    Medical History: Past Medical History:  Diagnosis Date   Allergy    Anxiety    Arthritis    Asthma    Bipolar 1 disorder ([facility])    Chest pain    Depression    GERD (gastroesophageal reflux disease)    History of kidney stones    Hyperlipidemia    Hypertension    Insomnia    Mitral valve regurgitation    Osteoporosis    Peripheral neuropathy    Tricuspid valve regurgitation     Family History: Family History  Problem Relation Age of Onset   Breast cancer Paternal Aunt        47's   Heart disease Mother    Hypertension Mother    Stroke Mother    Stroke Father    Cancer Father    Heart disease Father    Diabetes Sister    Diabetes Brother     Social History   Socioeconomic History   Marital status: Married    Spouse name: Not on file   Number of children: Not on file   Years of education: Not on file   Highest education level: Not on file  Occupational History   Not on file  Tobacco Use   Smoking status: Former    Packs/day: 0.50    Years: 25.00    Pack years: 12.50    Types: Cigarettes   Smokeless tobacco: Never   Tobacco comments:    not since september 30  Vaping Use   Vaping Use: Never used  Substance and Sexual Activity   Alcohol use:  Not Currently   Drug use: No   Sexual activity: Not on file  Other Topics Concern   Not on file  Social History Narrative   Not on file   Social Determinants of Health   Financial Resource Strain: Not on file  Food Insecurity: Not on file  Transportation Needs: Not on file  Physical Activity: Not on file  Stress: Not on file  Social Connections: Not on file  Intimate Partner Violence: Not on file      Review of Systems  Constitutional:  Negative for chills, fatigue and unexpected weight change.  HENT:  Negative for congestion, postnasal drip, rhinorrhea, sneezing and sore throat.   Eyes:  Negative for  redness.  Respiratory:  Negative for cough, chest tightness and shortness of breath.   Cardiovascular:  Negative for chest pain and palpitations.  Gastrointestinal:  Negative for abdominal pain, constipation, diarrhea, nausea and vomiting.  Genitourinary:  Negative for dysuria and frequency.  Musculoskeletal:  Positive for arthralgias and back pain. Negative for joint swelling and neck pain.  Skin:  Negative for rash.  Neurological:  Positive for numbness. Negative for tremors.  Hematological:  Negative for adenopathy. Does not bruise/bleed easily.  Psychiatric/Behavioral:  Negative for behavioral problems (Depression), sleep disturbance and suicidal ideas. The patient is not nervous/anxious.    Vital Signs: BP 128/80   Pulse 64   Temp 98.6 F (37 C)   Resp 16   Ht _0  (1.702 m)   Wt 152 lb (68.9 kg)   SpO2 99%   BMI 23.81 kg/m    Physical Exam Constitutional:      Appearance: Normal appearance.  HENT:     Head: Normocephalic and atraumatic.     Nose: Nose normal.     Mouth/Throat:     Mouth: Mucous membranes are moist.     Pharynx: No posterior oropharyngeal erythema.  Eyes:     Extraocular Movements: Extraocular movements intact.     Pupils: Pupils are equal, round, and reactive to light.  Cardiovascular:     Pulses: Normal pulses.     Heart sounds:  Normal heart sounds.  Pulmonary:     Effort: Pulmonary effort is normal.     Breath sounds: Normal breath sounds.  Musculoskeletal:        General: Tenderness present. No swelling.     Comments:  .  Patient is sitting Stout in the chair in the examination room and is unable to get comfortable  Neurological:     General: No focal deficit present.     Mental Status: She is alert.     Gait: Gait abnormal.  Psychiatric:        Mood and Affect: Mood normal.        Behavior: Behavior normal.       Assessment/Plan: Patient is instructed to reach out to her neurosurgery or orthopedics for further management since she did have abnormal MRI in 2020 in the meantime we will we will treated with prednisone however there are risks for long-term use of steroids which I discussed with her today we will try to control pain by using other medications like Flexeril or Celebrex and Cymbalta predniSONE (STERAPRED UNI-PAK 21 TAB) 10 MG (21) TBPK tablet; Take as directed for 6 days  Dispense: 21 tablet; Refill: 0 - cyclobenzaprine (FLEXERIL) 10 MG tablet; Take 1 tablet (10 mg total) by mouth at bedtime. Take one tab po qhs for back spasm prn only  Dispense: 30 tablet; Refill: 1 - celecoxib (CELEBREX) 200 MG capsule; Take 1 capsule (200 mg total) by mouth daily. For arthritis and back pain  Dispense: 90 capsule; Refill: 0 - Magnesium - ANA w/Reflex if Positive; Future - Sed Rate (ESR)  2. Neurogenic claudication due to lumbar spinal stenosis Will start Cymbalta 20 mg once a day and titrate Neurontin instruction was given to the patient for titration - DULoxetine (CYMBALTA) 20 MG capsule; Take 1 capsule (20 mg total) by mouth daily.  Dispense: 30 capsule; Refill: 3 - gabapentin (NEURONTIN) 100 MG capsule; Take 1 capsule (100 mg total) by mouth at bedtime. And increase to 2 tabs after 3 days and then take 3 tabs at bed time  Dispense: 90  capsule; Refill: 3  3. Primary insomnia This is controlled with  trazodone we will refill - traZODone (DESYREL) 100 MG tablet; Take one tab po qhs for sleep  Dispense: 90 tablet; Refill: 0  4. Essential hypertension Continue with metoprolol, lisinopril 2 different35 doses as prescribed - lisinopril-hydrochlorothiazide (ZESTORETIC) 20-12.5 MG tablet; Take 1 tablet by mouth daily.  Dispense: 90 tablet; Refill: 3 - lisinopril (ZESTRIL) 20 MG tablet; Take one tab at night for bp ( also has lisinopril hctz ) takes both  Dispense: 90 tablet; Refill: 1 - Basic Metabolic Panel (BMET) - Magnesium   General Counseling: Waneda verbalizes understanding of the findings of todays visit and agrees with plan of treatment. I have discussed any further diagnostic evaluation that may be needed or ordered today. We also reviewed her medications today. she has been encouraged to call the office with any questions or concerns that should arise related to todays visit.    Orders Placed This Encounter  Procedures   Basic Metabolic Panel (BMET)   Magnesium   ANA w/Reflex if Positive   Sed Rate (ESR)    Meds ordered this encounter  Medications   predniSONE (STERAPRED UNI-PAK 21 TAB) 10 MG (21) TBPK tablet    Sig: Take as directed for 6 days    Dispense:  21 tablet    Refill:  0   DULoxetine (CYMBALTA) 20 MG capsule    Sig: Take 1 capsule (20 mg total) by mouth daily.    Dispense:  30 capsule    Refill:  3   gabapentin (NEURONTIN) 100 MG capsule    Sig: Take 1 capsule (100 mg total) by mouth at bedtime. And increase to 2 tabs after 3 days and then take 3 tabs at bed time    Dispense:  90 capsule    Refill:  3   cyclobenzaprine (FLEXERIL) 10 MG tablet    Sig: Take 1 tablet (10 mg total) by mouth at bedtime. Take one tab po qhs for back spasm prn only    Dispense:  30 tablet    Refill:  1   lisinopril-hydrochlorothiazide (ZESTORETIC) 20-12.5 MG tablet    Sig: Take 1 tablet by mouth daily.    Dispense:  90 tablet    Refill:  3   lisinopril (ZESTRIL) 20 MG tablet     Sig: Take one tab at night for bp ( also has lisinopril hctz ) takes both    Dispense:  90 tablet    Refill:  1   traZODone (DESYREL) 100 MG tablet    Sig: Take one tab po qhs for sleep    Dispense:  90 tablet    Refill:  0   celecoxib (CELEBREX) 200 MG capsule    Sig: Take 1 capsule (200 mg total) by mouth daily. For arthritis and back pain    Dispense:  90 capsule    Refill:  0    Total time spent:35 Minutes Time spent includes review of chart, medications, test results, and follow up plan with the patient.   [place] Controlled Substance Database was reviewed by me.   Dr Lavera Guise Internal medicine

## 2021-06-04 ENCOUNTER — Ambulatory Visit: Payer: Medicaid Other | Admitting: Physician Assistant

## 2021-06-26 ENCOUNTER — Other Ambulatory Visit: Payer: Self-pay | Admitting: Internal Medicine

## 2021-06-26 DIAGNOSIS — M48061 Spinal stenosis, lumbar region without neurogenic claudication: Secondary | ICD-10-CM

## 2021-07-26 ENCOUNTER — Other Ambulatory Visit: Payer: Self-pay | Admitting: Internal Medicine

## 2021-07-26 DIAGNOSIS — M48062 Spinal stenosis, lumbar region with neurogenic claudication: Secondary | ICD-10-CM

## 2021-08-27 ENCOUNTER — Ambulatory Visit: Payer: Medicaid Other | Admitting: Physician Assistant

## 2021-08-27 DIAGNOSIS — Z0289 Encounter for other administrative examinations: Secondary | ICD-10-CM

## 2021-09-24 ENCOUNTER — Other Ambulatory Visit: Payer: Self-pay | Admitting: Internal Medicine

## 2021-09-24 DIAGNOSIS — F5101 Primary insomnia: Secondary | ICD-10-CM

## 2021-09-28 ENCOUNTER — Other Ambulatory Visit: Payer: Self-pay | Admitting: Internal Medicine

## 2021-09-28 DIAGNOSIS — I10 Essential (primary) hypertension: Secondary | ICD-10-CM

## 2021-10-05 ENCOUNTER — Ambulatory Visit (INDEPENDENT_AMBULATORY_CARE_PROVIDER_SITE_OTHER): Payer: Medicare Other | Admitting: Physician Assistant

## 2021-10-05 ENCOUNTER — Encounter: Payer: Self-pay | Admitting: Physician Assistant

## 2021-10-05 ENCOUNTER — Other Ambulatory Visit: Payer: Self-pay | Admitting: Physician Assistant

## 2021-10-05 ENCOUNTER — Other Ambulatory Visit: Payer: Self-pay

## 2021-10-05 ENCOUNTER — Telehealth: Payer: Self-pay

## 2021-10-05 DIAGNOSIS — F5101 Primary insomnia: Secondary | ICD-10-CM | POA: Diagnosis not present

## 2021-10-05 DIAGNOSIS — I10 Essential (primary) hypertension: Secondary | ICD-10-CM | POA: Diagnosis not present

## 2021-10-05 DIAGNOSIS — Z0001 Encounter for general adult medical examination with abnormal findings: Secondary | ICD-10-CM

## 2021-10-05 DIAGNOSIS — J069 Acute upper respiratory infection, unspecified: Secondary | ICD-10-CM

## 2021-10-05 DIAGNOSIS — R5383 Other fatigue: Secondary | ICD-10-CM

## 2021-10-05 DIAGNOSIS — E559 Vitamin D deficiency, unspecified: Secondary | ICD-10-CM

## 2021-10-05 DIAGNOSIS — M48061 Spinal stenosis, lumbar region without neurogenic claudication: Secondary | ICD-10-CM

## 2021-10-05 DIAGNOSIS — E538 Deficiency of other specified B group vitamins: Secondary | ICD-10-CM | POA: Diagnosis not present

## 2021-10-05 DIAGNOSIS — E782 Mixed hyperlipidemia: Secondary | ICD-10-CM | POA: Diagnosis not present

## 2021-10-05 DIAGNOSIS — R300 Dysuria: Secondary | ICD-10-CM | POA: Diagnosis not present

## 2021-10-05 DIAGNOSIS — G8929 Other chronic pain: Secondary | ICD-10-CM | POA: Diagnosis not present

## 2021-10-05 DIAGNOSIS — M545 Low back pain, unspecified: Secondary | ICD-10-CM | POA: Diagnosis not present

## 2021-10-05 DIAGNOSIS — Z1231 Encounter for screening mammogram for malignant neoplasm of breast: Secondary | ICD-10-CM

## 2021-10-05 DIAGNOSIS — R252 Cramp and spasm: Secondary | ICD-10-CM | POA: Diagnosis not present

## 2021-10-05 DIAGNOSIS — M48062 Spinal stenosis, lumbar region with neurogenic claudication: Secondary | ICD-10-CM

## 2021-10-05 MED ORDER — DULOXETINE HCL 20 MG PO CPEP: 20.0000 mg | ORAL_CAPSULE | Freq: Every day | ORAL | 1 refills | Status: DC

## 2021-10-05 MED ORDER — TRAZODONE HCL 100 MG PO TABS
ORAL_TABLET | ORAL | 0 refills | Status: DC
Start: 2021-10-05 — End: 2021-12-30

## 2021-10-05 MED ORDER — ESTROGENS CONJUGATED 1.25 MG PO TABS: ORAL_TABLET | ORAL | 1 refills | Status: DC

## 2021-10-05 MED ORDER — LISINOPRIL 20 MG PO TABS
ORAL_TABLET | ORAL | 1 refills | Status: DC
Start: 2021-10-05 — End: 2022-06-06

## 2021-10-05 MED ORDER — SOLIFENACIN SUCCINATE 5 MG PO TABS: 5.0000 mg | ORAL_TABLET | Freq: Every day | ORAL | 0 refills | Status: DC

## 2021-10-05 MED ORDER — METOPROLOL TARTRATE 50 MG PO TABS: 50.0000 mg | ORAL_TABLET | Freq: Two times a day (BID) | ORAL | 1 refills | Status: DC

## 2021-10-05 MED ORDER — CYCLOBENZAPRINE HCL 10 MG PO TABS: 10.0000 mg | ORAL_TABLET | Freq: Every day | ORAL | 1 refills | Status: DC

## 2021-10-05 MED ORDER — AZITHROMYCIN 250 MG PO TABS: ORAL_TABLET | ORAL | 0 refills | Status: DC

## 2021-10-05 NOTE — Telephone Encounter (Signed)
Awaiting 10/05/21 notes for neurology referral-Misty Robbins

## 2021-10-05 NOTE — Progress Notes (Signed)
[facility] [place],  [id number]  Internal MEDICINE  Office Visit Note  Patient Name: Misty Robbins  [id number]  [id number]  Date of Service: 10/09/2021  Chief Complaint  Patient presents with   Medicare Wellness    Discuss bladder leakage    Hypertension   Hyperlipidemia   Depression   Cough    For 3 months, will cough for 3 to 4 minutes straight    Back Pain    Lower back pain going down butt cheeks and legs, has been going on for years and has gotten worse    Medication Refill     HPI Pt is here for routine health maintenance examination -needs new Neurosurgery referral since it has been too long since she last saw them and her previous Psychologist, sport and exercise retired. She has chronic back pain and may need to be re-evaluated for surgery. -Describes having stress incontinence that is affecting her life. She states she had a hysterectomy and previously saw urology who said to do kegels and did not think she had a bladder prolapse that was contributing. Discussed that kegels can be beneficial but will also try treating some overactive bladder to see if this helps any. If not may need new urology referral or pelvic floor therapy. -Bad cough for 3 months, had laryngitis. Tried nyquil. She coughing now.  -stopped celebrex and gabapentin because developed blister along mouth and nipples. Stopped these meds and these went away. Unsure which med caused it but wants to refrain from both. -Did not have her labs ordered last visit done and is due for routine fasting labs therefore will reorder previous labs with these today to do all at once -Due for mammogram, UTD on colonoscopy -requests med refills today  Current Medication: Outpatient Encounter Medications as of 10/05/2021  Medication Sig   albuterol (PROVENTIL HFA;VENTOLIN HFA) 108 (90 Base) MCG/ACT inhaler Inhale into the lungs every 6 (six) hours as needed for wheezing or shortness of breath.   azithromycin  (ZITHROMAX) 250 MG tablet Take one tab a day for 10 days for uri   lisinopril-hydrochlorothiazide (ZESTORETIC) 20-12.5 MG tablet Take 1 tablet by mouth daily.   solifenacin (VESICARE) 5 MG tablet Take 1 tablet (5 mg total) by mouth daily.   [DISCONTINUED] cyclobenzaprine (FLEXERIL) 10 MG tablet Take 1 tablet (10 mg total) by mouth at bedtime. Take one tab po qhs for back spasm prn only   [DISCONTINUED] DULoxetine (CYMBALTA) 20 MG capsule TAKE 1 CAPSULE BY MOUTH DAILY   [DISCONTINUED] estrogens, conjugated, (PREMARIN) 1.25 MG tablet TAKE 1 TABLET(1.25 MG) BY MOUTH DAILY   [DISCONTINUED] lisinopril (ZESTRIL) 20 MG tablet Take one tab at night for bp ( also has lisinopril hctz ) takes both   [DISCONTINUED] metoprolol tartrate (LOPRESSOR) 50 MG tablet TAKE 1 TABLET BY MOUTH TWICE DAILY   [DISCONTINUED] predniSONE (STERAPRED UNI-PAK 21 TAB) 10 MG (21) TBPK tablet Take as directed for 6 days   [DISCONTINUED] traZODone (DESYREL) 100 MG tablet Take one tab po qhs for sleep   cyclobenzaprine (FLEXERIL) 10 MG tablet Take 1 tablet (10 mg total) by mouth at bedtime. Take one tab po qhs for back spasm prn only   DULoxetine (CYMBALTA) 20 MG capsule Take 1 capsule (20 mg total) by mouth daily.   estrogens, conjugated, (PREMARIN) 1.25 MG tablet TAKE 1 TABLET(1.25 MG) BY MOUTH DAILY   lisinopril (ZESTRIL) 20 MG tablet Take one tab at night for bp ( also has lisinopril hctz ) takes both  metoprolol tartrate (LOPRESSOR) 50 MG tablet Take 1 tablet (50 mg total) by mouth 2 (two) times daily.   traZODone (DESYREL) 100 MG tablet Take one tab po qhs for sleep   [DISCONTINUED] Calcium Carb-Cholecalciferol 600-800 MG-UNIT TABS Take 1 tablet by mouth daily. (Patient not taking: Reported on 10/05/2021)   [DISCONTINUED] celecoxib (CELEBREX) 200 MG capsule TAKE ONE CAPSULE BY MOUTH DAILY FOR ARTHRITIS AND BACK PAIN (Patient not taking: Reported on 10/05/2021)   [DISCONTINUED] gabapentin (NEURONTIN) 100 MG capsule Take 1 capsule  (100 mg total) by mouth at bedtime. And increase to 2 tabs after 3 days and then take 3 tabs at bed time (Patient not taking: Reported on 10/05/2021)   No facility-administered encounter medications on file as of 10/05/2021.    Surgical History: Past Surgical History:  Procedure Laterality Date   ABDOMINAL HYSTERECTOMY  2000   APPENDECTOMY     BACK SURGERY     CERVICAL FUSION  2011,2012   c2-7   COLONOSCOPY WITH PROPOFOL N/A 04/13/2018   Procedure: COLONOSCOPY WITH PROPOFOL;  Surgeon: Lucilla Lame, MD;  Location: [place];  Service: Endoscopy;  Laterality: N/A;   ESOPHAGOGASTRODUODENOSCOPY (EGD) WITH PROPOFOL N/A 04/13/2018   Procedure: ESOPHAGOGASTRODUODENOSCOPY (EGD) WITH PROPOFOL;  Surgeon: Lucilla Lame, MD;  Location: [place];  Service: Endoscopy;  Laterality: N/A;   LAPAROSCOPY N/A 05/25/2018   Procedure: LAPAROSCOPY DIAGNOSTIC;  Surgeon: Ouida Sills [name], MD;  Location: [facility];  Service: Gynecology;  Laterality: N/A;   LYSIS OF ADHESION N/A 05/25/2018   Procedure: LYSIS OF ADHESION;  Surgeon: Schermerhorn, [name], MD;  Location: [facility];  Service: Gynecology;  Laterality: N/A;   POLYPECTOMY  04/13/2018   Procedure: POLYPECTOMY;  Surgeon: Lucilla Lame, MD;  Location: [place];  Service: Endoscopy;;   SKIN GRAFT  1981   from right thigh to bilateral ankles and right knee   TONSILLECTOMY  2013   TRACHELECTOMY N/A 05/25/2018   Procedure: TRACHELECTOMY;  Surgeon: Schermerhorn, [name], MD;  Location: [facility];  Service: Gynecology;  Laterality: N/A;    Medical History: Past Medical History:  Diagnosis Date   Allergy    Anxiety    Arthritis    Asthma    Bipolar 1 disorder (HCC)    Chest pain    Depression    GERD (gastroesophageal reflux disease)    History of kidney stones    Hyperlipidemia    Hypertension    Insomnia    Mitral valve regurgitation    Osteoporosis    Peripheral neuropathy    Tricuspid valve regurgitation      Family History: Family History  Problem Relation Age of Onset   Breast cancer Paternal Aunt        24's   Heart disease Mother    Hypertension Mother    Stroke Mother    Stroke Father    Cancer Father    Heart disease Father    Diabetes Sister    Diabetes Brother       Review of Systems  Constitutional:  Negative for chills, fatigue and unexpected weight change.  HENT:  Negative for congestion, postnasal drip, rhinorrhea, sneezing and sore throat.   Eyes:  Negative for redness.  Respiratory:  Negative for cough, chest tightness and shortness of breath.   Cardiovascular:  Negative for chest pain and palpitations.  Gastrointestinal:  Negative for abdominal pain, constipation, diarrhea, nausea and vomiting.  Genitourinary:  Positive for frequency. Negative for dysuria.       Incontinence  Musculoskeletal:  Positive for arthralgias and back pain. Negative for joint swelling and neck pain.  Skin:  Negative for rash.  Neurological:  Positive for numbness. Negative for tremors.  Hematological:  Negative for adenopathy. Does not bruise/bleed easily.  Psychiatric/Behavioral:  Negative for behavioral problems (Depression), sleep disturbance and suicidal ideas. The patient is not nervous/anxious.     Vital Signs: BP 131/69   Pulse (!) 56   Temp 98.5 F (36.9 C)   Resp 16   Ht '5\' 7"'  (1.702 m)   Wt 150 lb 3.2 oz (68.1 kg)   SpO2 99%   BMI 23.52 kg/m    Physical Exam Vitals and nursing note reviewed.  Constitutional:      General: She is not in acute distress.    Appearance: She is well-developed and normal weight. She is not diaphoretic.  HENT:     Head: Normocephalic and atraumatic.     Mouth/Throat:     Pharynx: No oropharyngeal exudate.  Eyes:     Pupils: Pupils are equal, round, and reactive to light.  Neck:     Thyroid: No thyromegaly.     Vascular: No JVD.     Trachea: No tracheal deviation.  Cardiovascular:     Rate and Rhythm: Normal rate and regular  rhythm.     Heart sounds: Normal heart sounds. No murmur heard.   No friction rub. No gallop.  Pulmonary:     Effort: Pulmonary effort is normal. No respiratory distress.     Breath sounds: No wheezing or rales.  Chest:     Chest wall: No tenderness.  Abdominal:     General: Bowel sounds are normal.     Palpations: Abdomen is soft.  Musculoskeletal:        General: Tenderness present.     Cervical back: Normal range of motion and neck supple.  Lymphadenopathy:     Cervical: No cervical adenopathy.  Skin:    General: Skin is warm and dry.  Neurological:     Mental Status: She is alert and oriented to person, place, and time.     Cranial Nerves: No cranial nerve deficit.  Psychiatric:        Behavior: Behavior normal.        Thought Content: Thought content normal.        Judgment: Judgment normal.     LABS: Recent Results (from the past 2160 hour(s))  TSH + free T4     Status: None   Collection Time: 10/05/21 10:07 AM  Result Value Ref Range   TSH 0.942 0.450 - 4.500 uIU/mL   Free T4 1.13 0.82 - 1.77 ng/dL  Comprehensive metabolic panel     Status: None   Collection Time: 10/05/21 10:07 AM  Result Value Ref Range   Glucose 89 70 - 99 mg/dL   BUN 13 6 - 24 mg/dL   Creatinine, Ser 0.97 0.57 - 1.00 mg/dL   eGFR 72 >59 mL/min/1.73   BUN/Creatinine Ratio 13 9 - 23   Sodium 138 134 - 144 mmol/L   Potassium 4.5 3.5 - 5.2 mmol/L   Chloride 101 96 - 106 mmol/L   CO2 24 20 - 29 mmol/L   Calcium 9.3 8.7 - 10.2 mg/dL   Total Protein 7.0 6.0 - 8.5 g/dL   Albumin 4.0 3.8 - 4.8 g/dL   Globulin, Total 3.0 1.5 - 4.5 g/dL   Albumin/Globulin Ratio 1.3 1.2 - 2.2   Bilirubin Total 0.2 0.0 - 1.2 mg/dL  Alkaline Phosphatase 97 44 - 121 IU/L   AST 16 0 - 40 IU/L   ALT 9 0 - 32 IU/L  CBC w/Diff/Platelet     Status: Abnormal   Collection Time: 10/05/21 10:07 AM  Result Value Ref Range   WBC 8.4 3.4 - 10.8 x10E3/uL   RBC 4.24 3.77 - 5.28 x10E6/uL   Hemoglobin 12.7 11.1 - 15.9 g/dL    Hematocrit 38.4 34.0 - 46.6 %   MCV 91 79 - 97 fL   MCH 30.0 26.6 - 33.0 pg   MCHC 33.1 31.5 - 35.7 g/dL   RDW 12.6 11.7 - 15.4 %   Platelets 251 150 - 450 x10E3/uL   Neutrophils 54 Not Estab. %   Lymphs 33 Not Estab. %   Monocytes 7 Not Estab. %   Eos 5 Not Estab. %   Basos 1 Not Estab. %   Neutrophils Absolute 4.5 1.4 - 7.0 x10E3/uL   Lymphocytes Absolute 2.8 0.7 - 3.1 x10E3/uL   Monocytes Absolute 0.6 0.1 - 0.9 x10E3/uL   EOS (ABSOLUTE) 0.5 (H) 0.0 - 0.4 x10E3/uL   Basophils Absolute 0.1 0.0 - 0.2 x10E3/uL   Immature Granulocytes 0 Not Estab. %   Immature Grans (Abs) 0.0 0.0 - 0.1 x10E3/uL  Lipid Panel With LDL/HDL Ratio     Status: Abnormal   Collection Time: 10/05/21 10:07 AM  Result Value Ref Range   Cholesterol, Total 231 (H) 100 - 199 mg/dL   Triglycerides 229 (H) 0 - 149 mg/dL   HDL 46 >39 mg/dL   VLDL Cholesterol Cal 41 (H) 5 - 40 mg/dL   LDL Chol Calc (NIH) 144 (H) 0 - 99 mg/dL   LDL/HDL Ratio 3.1 0.0 - 3.2 ratio    Comment:                                     LDL/HDL Ratio                                             Men  Women                               1/2 Avg.Risk  1.0    1.5                                   Avg.Risk  3.6    3.2                                2X Avg.Risk  6.2    5.0                                3X Avg.Risk  8.0    6.1   Magnesium     Status: None   Collection Time: 10/05/21 10:07 AM  Result Value Ref Range   Magnesium 1.8 1.6 - 2.3 mg/dL  Sedimentation rate     Status: None   Collection Time: 10/05/21 10:07 AM  Result Value Ref Range   Sed Rate 4 0 -  32 mm/hr  VITAMIN D 25 Hydroxy (Vit-D Deficiency, Fractures)     Status: None   Collection Time: 10/05/21 10:07 AM  Result Value Ref Range   Vit D, 25-Hydroxy 35.0 30.0 - 100.0 ng/mL    Comment: Vitamin D deficiency has been defined by the [place] practice guideline as a level of serum 25-OH vitamin D less than 20 ng/mL (1,2). The Endocrine  Society went on to further define vitamin D insufficiency as a level between 21 and 29 ng/mL (2). 1. IOM (Institute of Medicine). 2010. Dietary reference    intakes for calcium and D. [place]: The    [employer]. 2. Holick MF, [place]inkley [place], Bischoff-Ferrari HA, et al.    Evaluation, treatment, and prevention of vitamin D    deficiency: an Endocrine Society clinical practice    guideline. JCEM. 2011 Jul; 96(7):1911-30.   B12 and Folate Panel     Status: None   Collection Time: 10/05/21 10:07 AM  Result Value Ref Range   Vitamin B-12 377 232 - 1,245 pg/mL   Folate 4.3 >3.0 ng/mL    Comment: A serum folate concentration of less than 3.1 ng/mL is considered to represent clinical deficiency.   ANA w/Reflex if Positive     Status: None   Collection Time: 10/05/21 10:07 AM  Result Value Ref Range   Anti Nuclear Antibody (ANA) Negative Negative  Basic Metabolic Panel (BMET)     Status: None   Collection Time: 10/05/21 10:08 AM  Result Value Ref Range   Glucose 90 70 - 99 mg/dL   BUN 13 6 - 24 mg/dL   Creatinine, Ser 1.00 0.57 - 1.00 mg/dL   eGFR 69 >59 mL/min/1.73   BUN/Creatinine Ratio 13 9 - 23   Sodium 140 134 - 144 mmol/L   Potassium 4.5 3.5 - 5.2 mmol/L   Chloride 103 96 - 106 mmol/L   CO2 24 20 - 29 mmol/L   Calcium 9.1 8.7 - 10.2 mg/dL  Magnesium     Status: None   Collection Time: 10/05/21 10:08 AM  Result Value Ref Range   Magnesium 1.8 1.6 - 2.3 mg/dL  Sed Rate (ESR)     Status: None   Collection Time: 10/05/21 10:08 AM  Result Value Ref Range   Sed Rate 7 0 - 32 mm/hr  UA/M w/rflx Culture, Routine     Status: Abnormal   Collection Time: 10/05/21 10:27 AM   Specimen: Urine   Urine  Result Value Ref Range   Specific Gravity, UA 1.016 1.005 - 1.030   pH, UA 8.0 (H) 5.0 - 7.5   Color, UA Yellow Yellow   Appearance Ur Clear Clear   Leukocytes,UA Negative Negative   Protein,UA Negative Negative/Trace   Glucose, UA Negative Negative   Ketones, UA  Negative Negative   RBC, UA Negative Negative   Bilirubin, UA Negative Negative   Urobilinogen, Ur 0.2 0.2 - 1.0 mg/dL   Nitrite, UA Negative Negative   Microscopic Examination Comment     Comment: Microscopic follows if indicated.   Microscopic Examination See below:     Comment: Microscopic was indicated and was performed.   Urinalysis Reflex Comment     Comment: This specimen will not reflex to a Urine Culture.  Microscopic Examination     Status: None   Collection Time: 10/05/21 10:27 AM   Urine  Result Value Ref Range   WBC, UA None seen 0 - 5 /hpf  RBC 0-2 0 - 2 /hpf   Epithelial Cells (non renal) 0-10 0 - 10 /hpf   Casts None seen None seen /lpf   Bacteria, UA None seen None seen/Few        Assessment/Plan: 1. Encounter for general adult medical examination with abnormal findings CPE performed, routine fasting labs ordered, mammogram ordered, UTD on colonoscopy  2. Spinal stenosis at L4-L5 level Will refer to neurosurgery for re-evaluation - Ambulatory referral to Neurosurgery - cyclobenzaprine (FLEXERIL) 10 MG tablet; Take 1 tablet (10 mg total) by mouth at bedtime. Take one tab po qhs for back spasm prn only  Dispense: 30 tablet; Refill: 1  3. Neurogenic claudication due to lumbar spinal stenosis Will refer to neurosurgery for re-evaluation - Ambulatory referral to Neurosurgery - DULoxetine (CYMBALTA) 20 MG capsule; Take 1 capsule (20 mg total) by mouth daily.  Dispense: 90 capsule; Refill: 1  4. Essential hypertension Stable, continue current medications - lisinopril (ZESTRIL) 20 MG tablet; Take one tab at night for bp ( also has lisinopril hctz ) takes both  Dispense: 90 tablet; Refill: 1 - metoprolol tartrate (LOPRESSOR) 50 MG tablet; Take 1 tablet (50 mg total) by mouth 2 (two) times daily.  Dispense: 90 tablet; Refill: 1  5. Upper respiratory tract infection, unspecified type May take zpak and mucinex - azithromycin (ZITHROMAX) 250 MG tablet; Take one tab  a day for 10 days for uri  Dispense: 10 tablet; Refill: 0  6. Primary insomnia May continue trazodone before bed - traZODone (DESYREL) 100 MG tablet; Take one tab po qhs for sleep  Dispense: 90 tablet; Refill: 0  7. Other fatigue - TSH + free T4 - Comprehensive metabolic panel - CBC w/Diff/Platelet - Lipid Panel With LDL/HDL Ratio - Magnesium - Sedimentation rate - ANA w/Reflex if Positive  8. Vitamin D deficiency - VITAMIN D 25 Hydroxy (Vit-D Deficiency, Fractures)  9. Visit for screening mammogram - MM 3D SCREEN BREAST BILATERAL; Future  10. B12 deficiency - B12 and Folate Panel  11. Dysuria - UA/M w/rflx Culture, Routine   General Counseling: Orville verbalizes understanding of the findings of todays visit and agrees with plan of treatment. I have discussed any further diagnostic evaluation that may be needed or ordered today. We also reviewed her medications today. she has been encouraged to call the office with any questions or concerns that should arise related to todays visit.    Counseling:    Orders Placed This Encounter  Procedures   Microscopic Examination   MM 3D SCREEN BREAST BILATERAL   TSH + free T4   Comprehensive metabolic panel   CBC w/Diff/Platelet   Lipid Panel With LDL/HDL Ratio   Magnesium   Sedimentation rate   VITAMIN D 25 Hydroxy (Vit-D Deficiency, Fractures)   B12 and Folate Panel   ANA w/Reflex if Positive   UA/M w/rflx Culture, Routine   ANA w/Reflex if Positive   Ambulatory referral to Neurosurgery    Meds ordered this encounter  Medications   azithromycin (ZITHROMAX) 250 MG tablet    Sig: Take one tab a day for 10 days for uri    Dispense:  10 tablet    Refill:  0   solifenacin (VESICARE) 5 MG tablet    Sig: Take 1 tablet (5 mg total) by mouth daily.    Dispense:  30 tablet    Refill:  0   cyclobenzaprine (FLEXERIL) 10 MG tablet    Sig: Take 1 tablet (10 mg total) by mouth at bedtime.  Take one tab po qhs for back spasm prn  only    Dispense:  30 tablet    Refill:  1   DULoxetine (CYMBALTA) 20 MG capsule    Sig: Take 1 capsule (20 mg total) by mouth daily.    Dispense:  90 capsule    Refill:  1   estrogens, conjugated, (PREMARIN) 1.25 MG tablet    Sig: TAKE 1 TABLET(1.25 MG) BY MOUTH DAILY    Dispense:  30 tablet    Refill:  1   lisinopril (ZESTRIL) 20 MG tablet    Sig: Take one tab at night for bp ( also has lisinopril hctz ) takes both    Dispense:  90 tablet    Refill:  1   metoprolol tartrate (LOPRESSOR) 50 MG tablet    Sig: Take 1 tablet (50 mg total) by mouth 2 (two) times daily.    Dispense:  90 tablet    Refill:  1   traZODone (DESYREL) 100 MG tablet    Sig: Take one tab po qhs for sleep    Dispense:  90 tablet    Refill:  0    This patient was seen by Drema Dallas, PA-C in collaboration with Dr. Clayborn Bigness as a part of collaborative care agreement.  Total time spent:35 Minutes  Time spent includes review of chart, medications, test results, and follow up plan with the patient.     Lavera Guise, MD  Internal Medicine

## 2021-10-06 LAB — MICROSCOPIC EXAMINATION
Bacteria, UA: NONE SEEN
Casts: NONE SEEN /lpf
WBC, UA: NONE SEEN /hpf (ref 0–5)

## 2021-10-06 LAB — SEDIMENTATION RATE
Sed Rate: 7 mm/hr (ref 0–32)
Sed Rate: 4 mm/hr (ref 0–32)

## 2021-10-06 LAB — UA/M W/RFLX CULTURE, ROUTINE
Bilirubin, UA: NEGATIVE
Glucose, UA: NEGATIVE
Ketones, UA: NEGATIVE
Leukocytes,UA: NEGATIVE
Nitrite, UA: NEGATIVE
Protein,UA: NEGATIVE
RBC, UA: NEGATIVE
Specific Gravity, UA: 1.016 (ref 1.005–1.030)
Urobilinogen, Ur: 0.2 mg/dL (ref 0.2–1.0)
pH, UA: 8 — ABNORMAL HIGH (ref 5.0–7.5)

## 2021-10-06 LAB — BASIC METABOLIC PANEL
BUN/Creatinine Ratio: 13 (ref 9–23)
BUN: 13 mg/dL (ref 6–24)
CO2: 24 mmol/L (ref 20–29)
Calcium: 9.1 mg/dL (ref 8.7–10.2)
Chloride: 103 mmol/L (ref 96–106)
Creatinine, Ser: 1 mg/dL (ref 0.57–1.00)
Glucose: 90 mg/dL (ref 70–99)
Potassium: 4.5 mmol/L (ref 3.5–5.2)
Sodium: 140 mmol/L (ref 134–144)
eGFR: 69 mL/min/{1.73_m2} (ref >59)

## 2021-10-06 LAB — LIPID PANEL WITH LDL/HDL RATIO
Cholesterol, Total: 231 mg/dL — ABNORMAL HIGH (ref 100–199)
HDL: 46 mg/dL (ref >39)
LDL Chol Calc (NIH): 144 mg/dL — ABNORMAL HIGH (ref 0–99)
LDL/HDL Ratio: 3.1 ratio (ref 0.0–3.2)
Triglycerides: 229 mg/dL — ABNORMAL HIGH (ref 0–149)
VLDL Cholesterol Cal: 41 mg/dL — ABNORMAL HIGH (ref 5–40)

## 2021-10-06 LAB — CBC WITH DIFFERENTIAL/PLATELET
Basophils Absolute: 0.1 10*3/uL (ref 0.0–0.2)
Basos: 1 %
EOS (ABSOLUTE): 0.5 10*3/uL — ABNORMAL HIGH (ref 0.0–0.4)
Eos: 5 %
Hematocrit: 38.4 % (ref 34.0–46.6)
Hemoglobin: 12.7 g/dL (ref 11.1–15.9)
Immature Grans (Abs): 0 10*3/uL (ref 0.0–0.1)
Immature Granulocytes: 0 %
Lymphocytes Absolute: 2.8 10*3/uL (ref 0.7–3.1)
Lymphs: 33 %
MCH: 30 pg (ref 26.6–33.0)
MCHC: 33.1 g/dL (ref 31.5–35.7)
MCV: 91 fL (ref 79–97)
Monocytes Absolute: 0.6 10*3/uL (ref 0.1–0.9)
Monocytes: 7 %
Neutrophils Absolute: 4.5 10*3/uL (ref 1.4–7.0)
Neutrophils: 54 %
Platelets: 251 10*3/uL (ref 150–450)
RBC: 4.24 x10E6/uL (ref 3.77–5.28)
RDW: 12.6 % (ref 11.7–15.4)
WBC: 8.4 10*3/uL (ref 3.4–10.8)

## 2021-10-06 LAB — MAGNESIUM
Magnesium: 1.8 mg/dL (ref 1.6–2.3)
Magnesium: 1.8 mg/dL (ref 1.6–2.3)

## 2021-10-06 LAB — TSH+FREE T4
Free T4: 1.13 ng/dL (ref 0.82–1.77)
TSH: 0.942 u[IU]/mL (ref 0.450–4.500)

## 2021-10-06 LAB — COMPREHENSIVE METABOLIC PANEL
ALT: 9 IU/L (ref 0–32)
AST: 16 IU/L (ref 0–40)
Albumin/Globulin Ratio: 1.3 (ref 1.2–2.2)
Albumin: 4 g/dL (ref 3.8–4.8)
Alkaline Phosphatase: 97 IU/L (ref 44–121)
BUN/Creatinine Ratio: 13 (ref 9–23)
BUN: 13 mg/dL (ref 6–24)
Bilirubin Total: 0.2 mg/dL (ref 0.0–1.2)
CO2: 24 mmol/L (ref 20–29)
Calcium: 9.3 mg/dL (ref 8.7–10.2)
Chloride: 101 mmol/L (ref 96–106)
Creatinine, Ser: 0.97 mg/dL (ref 0.57–1.00)
Globulin, Total: 3 g/dL (ref 1.5–4.5)
Glucose: 89 mg/dL (ref 70–99)
Potassium: 4.5 mmol/L (ref 3.5–5.2)
Sodium: 138 mmol/L (ref 134–144)
Total Protein: 7 g/dL (ref 6.0–8.5)
eGFR: 72 mL/min/{1.73_m2} (ref >59)

## 2021-10-06 LAB — VITAMIN D 25 HYDROXY (VIT D DEFICIENCY, FRACTURES): Vit D, 25-Hydroxy: 35 ng/mL (ref 30.0–100.0)

## 2021-10-06 LAB — ANA W/REFLEX IF POSITIVE: Anti Nuclear Antibody (ANA): NEGATIVE

## 2021-10-06 LAB — B12 AND FOLATE PANEL
Folate: 4.3 ng/mL (ref >3.0)
Vitamin B-12: 377 pg/mL (ref 232–1245)

## 2021-10-10 ENCOUNTER — Other Ambulatory Visit: Payer: Self-pay | Admitting: Physician Assistant

## 2021-10-10 ENCOUNTER — Telehealth: Payer: Self-pay

## 2021-10-10 DIAGNOSIS — E782 Mixed hyperlipidemia: Secondary | ICD-10-CM

## 2021-10-10 MED ORDER — PRAVASTATIN SODIUM 40 MG PO TABS
40.0000 mg | ORAL_TABLET | Freq: Every day | ORAL | 1 refills | Status: DC
Start: 2021-10-10 — End: 2022-04-08

## 2021-10-10 NOTE — Telephone Encounter (Signed)
Spoke to pt and informed her about her labs and elevated cholesterol.  Advised that Lauren would like to start pt back on a statin therapy and pt agreed to start taking again.

## 2021-10-10 NOTE — Telephone Encounter (Signed)
-----   Message from Mylinda Latina, PA-C sent at 10/10/2021 10:23 AM EST ----- Please let her know her labs overall look good except for elevated cholesterol. Patient stopped taking statin last year per chart. Can you please ask if she is willing to retry? If bad reaction to statin and unwilling to start back, we can try alternative ezetimibe or at least have her watch diet and start fish oil.

## 2021-10-12 NOTE — Telephone Encounter (Signed)
Neuro referral sent via [facility] to [facility]

## 2021-11-13 ENCOUNTER — Telehealth: Payer: Self-pay

## 2021-11-13 NOTE — Telephone Encounter (Signed)
Per [facility]. They have left patient 3 voicemails to schedule and also spoke with patient's husband. They still have not received call back. I left patient vm this morning to return my call to see if she is still interested in seen neuro-Toni

## 2021-11-13 NOTE — Telephone Encounter (Signed)
Spoke to pt, she was returning a phone call regarding referral to [facility] for her back, her appt was made for 12-14-21 at 9:30. [facility] had incorrect contact info but it has since been resolved/updated.

## 2021-11-30 ENCOUNTER — Other Ambulatory Visit: Payer: Self-pay | Admitting: Physician Assistant

## 2021-11-30 DIAGNOSIS — M48061 Spinal stenosis, lumbar region without neurogenic claudication: Secondary | ICD-10-CM

## 2021-12-30 ENCOUNTER — Other Ambulatory Visit: Payer: Self-pay | Admitting: Physician Assistant

## 2021-12-30 ENCOUNTER — Other Ambulatory Visit: Payer: Self-pay | Admitting: Internal Medicine

## 2021-12-30 DIAGNOSIS — F5101 Primary insomnia: Secondary | ICD-10-CM

## 2021-12-30 DIAGNOSIS — M48061 Spinal stenosis, lumbar region without neurogenic claudication: Secondary | ICD-10-CM

## 2022-01-04 ENCOUNTER — Encounter: Payer: Self-pay | Admitting: Physician Assistant

## 2022-01-04 ENCOUNTER — Other Ambulatory Visit: Payer: Self-pay

## 2022-01-04 ENCOUNTER — Ambulatory Visit (INDEPENDENT_AMBULATORY_CARE_PROVIDER_SITE_OTHER): Payer: Medicare Other | Admitting: Physician Assistant

## 2022-01-04 DIAGNOSIS — R32 Unspecified urinary incontinence: Secondary | ICD-10-CM | POA: Diagnosis not present

## 2022-01-04 DIAGNOSIS — E782 Mixed hyperlipidemia: Secondary | ICD-10-CM

## 2022-01-04 DIAGNOSIS — Z1339 Encounter for screening examination for other mental health and behavioral disorders: Secondary | ICD-10-CM

## 2022-01-04 DIAGNOSIS — M48062 Spinal stenosis, lumbar region with neurogenic claudication: Secondary | ICD-10-CM | POA: Diagnosis not present

## 2022-01-04 DIAGNOSIS — I10 Essential (primary) hypertension: Secondary | ICD-10-CM

## 2022-01-04 DIAGNOSIS — M48061 Spinal stenosis, lumbar region without neurogenic claudication: Secondary | ICD-10-CM

## 2022-01-04 MED ORDER — SOLIFENACIN SUCCINATE 10 MG PO TABS: 10.0000 mg | ORAL_TABLET | Freq: Every day | ORAL | 0 refills | Status: DC

## 2022-01-04 NOTE — Progress Notes (Cosign Needed)
[facility] [place], [place] [address]  Internal MEDICINE  Office Visit Note  Patient Name: Misty Robbins  [id number]  [id number]  Date of Service: 01/04/2022  Chief Complaint  Patient presents with   Follow-up   Depression   Gastroesophageal Reflux   Hypertension   Hyperlipidemia   Other    Feeling extra scatterbrained, can not double task    HPI Patient is here for routine follow-up -Has visit for her back on the 21st with [facility], continued to have back pain -BP at home 112/78 -Labs previously discussed over the phone showed elevated cholesterol and states she has restarted on pravastatin -She reports feeling more scatterbrained, cant multitask, Loses focus. Has been getting worse.  She states her husband progressing at this time as well.  Discussed having evaluation with psych for possible ADHD evaluation. Denies any prior history of ADHD diagnosis -She reports that urinary is improved some since starting on Vesicare she states she is able to make it to the bathroom.  She is interested in increasing the dose to 10 mg to see if this helps further  Current Medication: Outpatient Encounter Medications as of 01/04/2022  Medication Sig   albuterol (PROVENTIL HFA;VENTOLIN HFA) 108 (90 Base) MCG/ACT inhaler Inhale into the lungs every 6 (six) hours as needed for wheezing or shortness of breath.   cyclobenzaprine (FLEXERIL) 10 MG tablet TAKE 1 TABLET(10 MG) BY MOUTH EVERY NIGHT AT BEDTIME AS NEEDED FOR BACK SPASM   estrogens, conjugated, (PREMARIN) 1.25 MG tablet TAKE 1 TABLET(1.25 MG) BY MOUTH DAILY   lisinopril (ZESTRIL) 20 MG tablet Take one tab at night for bp ( also has lisinopril hctz ) takes both   lisinopril-hydrochlorothiazide (ZESTORETIC) 20-12.5 MG tablet Take 1 tablet by mouth daily.   metoprolol tartrate (LOPRESSOR) 50 MG tablet Take 1 tablet (50 mg total) by mouth 2 (two) times daily.   pravastatin (PRAVACHOL) 40 MG tablet Take 1 tablet (40 mg  total) by mouth daily.   solifenacin (VESICARE) 10 MG tablet Take 1 tablet (10 mg total) by mouth daily.   traZODone (DESYREL) 100 MG tablet TAKE 1 TABLET BY MOUTH EVERY NIGHT AT BEDTIME FOR SLEEP   [DISCONTINUED] solifenacin (VESICARE) 5 MG tablet TAKE 1 TABLET(5 MG) BY MOUTH DAILY   DULoxetine (CYMBALTA) 20 MG capsule Take 1 capsule (20 mg total) by mouth daily.   [DISCONTINUED] azithromycin (ZITHROMAX) 250 MG tablet Take one tab a day for 10 days for uri (Patient not taking: Reported on 01/04/2022)   No facility-administered encounter medications on file as of 01/04/2022.    Surgical History: Past Surgical History:  Procedure Laterality Date   ABDOMINAL HYSTERECTOMY  2000   APPENDECTOMY     BACK SURGERY     CERVICAL FUSION  2011,2012   c2-7   COLONOSCOPY WITH PROPOFOL N/A 04/13/2018   Procedure: COLONOSCOPY WITH PROPOFOL;  Surgeon: Lucilla Lame, MD;  Location: [facility];  Service: Endoscopy;  Laterality: N/A;   ESOPHAGOGASTRODUODENOSCOPY (EGD) WITH PROPOFOL N/A 04/13/2018   Procedure: ESOPHAGOGASTRODUODENOSCOPY (EGD) WITH PROPOFOL;  Surgeon: Lucilla Lame, MD;  Location: [facility];  Service: Endoscopy;  Laterality: N/A;   LAPAROSCOPY N/A 05/25/2018   Procedure: LAPAROSCOPY DIAGNOSTIC;  Surgeon: Ouida Sills [name], MD;  Location: [facility];  Service: Gynecology;  Laterality: N/A;   LYSIS OF ADHESION N/A 05/25/2018   Procedure: LYSIS OF ADHESION;  Surgeon: Schermerhorn, [name], MD;  Location: [facility];  Service: Gynecology;  Laterality: N/A;   POLYPECTOMY  04/13/2018  Procedure: POLYPECTOMY;  Surgeon: Lucilla Lame, MD;  Location: [facility];  Service: Endoscopy;;   SKIN GRAFT  1981   from right thigh to bilateral ankles and right knee   TONSILLECTOMY  2013   TRACHELECTOMY N/A 05/25/2018   Procedure: TRACHELECTOMY;  Surgeon: Schermerhorn, [name], MD;  Location: [facility];  Service: Gynecology;  Laterality: N/A;    Medical History: Past Medical History:   Diagnosis Date   Allergy    Anxiety    Arthritis    Asthma    Bipolar 1 disorder (HCC)    Chest pain    Depression    GERD (gastroesophageal reflux disease)    History of kidney stones    Hyperlipidemia    Hypertension    Insomnia    Mitral valve regurgitation    Osteoporosis    Peripheral neuropathy    Tricuspid valve regurgitation     Family History: Family History  Problem Relation Age of Onset   Breast cancer Paternal Aunt        28's   Heart disease Mother    Hypertension Mother    Stroke Mother    Stroke Father    Cancer Father    Heart disease Father    Diabetes Sister    Diabetes Brother     Social History   Socioeconomic History   Marital status: Married    Spouse name: Not on file   Number of children: Not on file   Years of education: Not on file   Highest education level: Not on file  Occupational History   Not on file  Tobacco Use   Smoking status: Former    Packs/day: 0.50    Years: 25.00    Pack years: 12.50    Types: Cigarettes   Smokeless tobacco: Never   Tobacco comments:    not since september 30  Vaping Use   Vaping Use: Never used  Substance and Sexual Activity   Alcohol use: Not Currently   Drug use: No   Sexual activity: Not on file  Other Topics Concern   Not on file  Social History Narrative   Not on file   Social Determinants of Health   Financial Resource Strain: Not on file  Food Insecurity: Not on file  Transportation Needs: Not on file  Physical Activity: Not on file  Stress: Not on file  Social Connections: Not on file  Intimate Partner Violence: Not on file      Review of Systems  Constitutional:  Negative for chills, fatigue and unexpected weight change.  HENT:  Negative for congestion, postnasal drip, rhinorrhea, sneezing and sore throat.   Eyes:  Negative for redness.  Respiratory:  Negative for cough, chest tightness and shortness of breath.   Cardiovascular:  Negative for chest pain and  palpitations.  Gastrointestinal:  Negative for abdominal pain, constipation, diarrhea, nausea and vomiting.  Genitourinary:  Positive for frequency. Negative for dysuria.  Musculoskeletal:  Positive for arthralgias and back pain. Negative for joint swelling and neck pain.  Skin:  Negative for rash.  Neurological:  Positive for numbness. Negative for tremors.  Hematological:  Negative for adenopathy. Does not bruise/bleed easily.  Psychiatric/Behavioral:  Negative for behavioral problems (Depression), sleep disturbance and suicidal ideas. The patient is not nervous/anxious.    Vital Signs: BP 102/68    Pulse 63    Temp 98.4 F (36.9 C)    Resp 16    Ht '5\' 7"'$  (1.702 m)    Wt  144 lb 12.8 oz (65.7 kg)    SpO2 99%    BMI 22.68 kg/m    Physical Exam Vitals and nursing note reviewed.  Constitutional:      General: She is not in acute distress.    Appearance: She is well-developed and normal weight. She is not diaphoretic.  HENT:     Head: Normocephalic and atraumatic.     Mouth/Throat:     Pharynx: No oropharyngeal exudate.  Eyes:     Pupils: Pupils are equal, round, and reactive to light.  Neck:     Thyroid: No thyromegaly.     Vascular: No JVD.     Trachea: No tracheal deviation.  Cardiovascular:     Rate and Rhythm: Normal rate and regular rhythm.     Heart sounds: Normal heart sounds. No murmur heard.   No friction rub. No gallop.  Pulmonary:     Effort: Pulmonary effort is normal. No respiratory distress.     Breath sounds: No wheezing or rales.  Chest:     Chest wall: No tenderness.  Abdominal:     General: Bowel sounds are normal.     Palpations: Abdomen is soft.  Musculoskeletal:     Cervical back: Normal range of motion and neck supple.  Lymphadenopathy:     Cervical: No cervical adenopathy.  Skin:    General: Skin is warm and dry.  Neurological:     Mental Status: She is alert and oriented to person, place, and time.     Cranial Nerves: No cranial nerve deficit.   Psychiatric:        Behavior: Behavior normal.        Thought Content: Thought content normal.        Judgment: Judgment normal.       Assessment/Plan: 1. Essential hypertension Well-controlled, continue current medication  2. Urinary incontinence in female Improving with use of 5 mg of Vesicare however still could improve further.  We will try increasing to 10 mg tablet.  Should continue to try doing Kegels - solifenacin (VESICARE) 10 MG tablet; Take 1 tablet (10 mg total) by mouth daily.  Dispense: 90 tablet; Refill: 0  3. Mixed hyperlipidemia Continue pravastatin  4. Neurogenic claudication due to lumbar spinal stenosis Has upcoming visit with neurosurgery to further address  5. Spinal stenosis at L4-L5 level Has upcoming visit with neurosurgery to further address  6. ADHD (attention deficit hyperactivity disorder) evaluation Will refer to psych for further evaluation of possible ADHD in adult without prior diagnosis - Ambulatory referral to Psychiatry   General Counseling: Leonarda verbalizes understanding of the findings of todays visit and agrees with plan of treatment. I have discussed any further diagnostic evaluation that may be needed or ordered today. We also reviewed her medications today. she has been encouraged to call the office with any questions or concerns that should arise related to todays visit.    Orders Placed This Encounter  Procedures   Ambulatory referral to Psychiatry    Meds ordered this encounter  Medications   solifenacin (VESICARE) 10 MG tablet    Sig: Take 1 tablet (10 mg total) by mouth daily.    Dispense:  90 tablet    Refill:  0    This patient was seen by Drema Dallas, PA-C in collaboration with Dr. Clayborn Bigness as a part of collaborative care agreement.   Total time spent:30 Minutes Time spent includes review of chart, medications, test results, and follow up plan with the patient.  Dr Lavera Guise Internal medicine

## 2022-01-08 ENCOUNTER — Other Ambulatory Visit: Payer: Self-pay | Admitting: Physician Assistant

## 2022-01-08 DIAGNOSIS — I10 Essential (primary) hypertension: Secondary | ICD-10-CM

## 2022-01-10 ENCOUNTER — Other Ambulatory Visit: Payer: Self-pay

## 2022-01-10 MED ORDER — LISINOPRIL-HYDROCHLOROTHIAZIDE 20-12.5 MG PO TABS: 1.0000 | ORAL_TABLET | Freq: Every day | ORAL | 3 refills | Status: DC

## 2022-01-15 DIAGNOSIS — M5136 Other intervertebral disc degeneration, lumbar region: Secondary | ICD-10-CM | POA: Diagnosis not present

## 2022-01-15 DIAGNOSIS — Z981 Arthrodesis status: Secondary | ICD-10-CM | POA: Diagnosis not present

## 2022-01-15 DIAGNOSIS — M5416 Radiculopathy, lumbar region: Secondary | ICD-10-CM | POA: Diagnosis not present

## 2022-01-15 DIAGNOSIS — M47812 Spondylosis without myelopathy or radiculopathy, cervical region: Secondary | ICD-10-CM | POA: Diagnosis not present

## 2022-01-15 DIAGNOSIS — M4807 Spinal stenosis, lumbosacral region: Secondary | ICD-10-CM | POA: Diagnosis not present

## 2022-01-16 ENCOUNTER — Other Ambulatory Visit: Payer: Self-pay | Admitting: Neurosurgery

## 2022-01-16 DIAGNOSIS — Z981 Arthrodesis status: Secondary | ICD-10-CM

## 2022-01-16 DIAGNOSIS — M4807 Spinal stenosis, lumbosacral region: Secondary | ICD-10-CM

## 2022-01-16 DIAGNOSIS — M47812 Spondylosis without myelopathy or radiculopathy, cervical region: Secondary | ICD-10-CM

## 2022-01-26 ENCOUNTER — Ambulatory Visit
Admission: RE | Admit: 2022-01-26 | Discharge: 2022-01-26 | Disposition: A | Discharge status: Home / Self Care | Payer: Medicare Other | Source: Ambulatory Visit | Attending: Neurosurgery | Admitting: Neurosurgery

## 2022-01-26 DIAGNOSIS — Z981 Arthrodesis status: Secondary | ICD-10-CM | POA: Insufficient documentation

## 2022-01-26 DIAGNOSIS — M47812 Spondylosis without myelopathy or radiculopathy, cervical region: Secondary | ICD-10-CM | POA: Diagnosis not present

## 2022-01-26 DIAGNOSIS — M4807 Spinal stenosis, lumbosacral region: Secondary | ICD-10-CM | POA: Diagnosis not present

## 2022-01-26 DIAGNOSIS — M5126 Other intervertebral disc displacement, lumbar region: Secondary | ICD-10-CM | POA: Diagnosis not present

## 2022-01-26 DIAGNOSIS — M4312 Spondylolisthesis, cervical region: Secondary | ICD-10-CM | POA: Diagnosis not present

## 2022-01-26 DIAGNOSIS — M48061 Spinal stenosis, lumbar region without neurogenic claudication: Secondary | ICD-10-CM | POA: Diagnosis not present

## 2022-02-16 ENCOUNTER — Other Ambulatory Visit: Payer: Self-pay | Admitting: Physician Assistant

## 2022-02-26 DIAGNOSIS — M47812 Spondylosis without myelopathy or radiculopathy, cervical region: Secondary | ICD-10-CM | POA: Diagnosis not present

## 2022-02-26 DIAGNOSIS — M5416 Radiculopathy, lumbar region: Secondary | ICD-10-CM | POA: Diagnosis not present

## 2022-02-26 DIAGNOSIS — M5136 Other intervertebral disc degeneration, lumbar region: Secondary | ICD-10-CM | POA: Diagnosis not present

## 2022-02-26 DIAGNOSIS — Z981 Arthrodesis status: Secondary | ICD-10-CM | POA: Diagnosis not present

## 2022-03-18 ENCOUNTER — Other Ambulatory Visit: Payer: Self-pay | Admitting: Physician Assistant

## 2022-03-18 DIAGNOSIS — R32 Unspecified urinary incontinence: Secondary | ICD-10-CM

## 2022-03-18 DIAGNOSIS — F5101 Primary insomnia: Secondary | ICD-10-CM

## 2022-04-08 ENCOUNTER — Other Ambulatory Visit: Payer: Self-pay | Admitting: Physician Assistant

## 2022-04-08 DIAGNOSIS — E782 Mixed hyperlipidemia: Secondary | ICD-10-CM

## 2022-04-08 DIAGNOSIS — I10 Essential (primary) hypertension: Secondary | ICD-10-CM

## 2022-04-10 DIAGNOSIS — M5416 Radiculopathy, lumbar region: Secondary | ICD-10-CM | POA: Diagnosis not present

## 2022-04-10 DIAGNOSIS — M6281 Muscle weakness (generalized): Secondary | ICD-10-CM | POA: Diagnosis not present

## 2022-05-09 ENCOUNTER — Ambulatory Visit: Payer: Medicare Other | Admitting: Physician Assistant

## 2022-06-06 ENCOUNTER — Other Ambulatory Visit: Payer: Self-pay | Admitting: Physician Assistant

## 2022-06-06 DIAGNOSIS — I10 Essential (primary) hypertension: Secondary | ICD-10-CM

## 2022-06-18 ENCOUNTER — Other Ambulatory Visit: Payer: Self-pay | Admitting: Physician Assistant

## 2022-06-28 ENCOUNTER — Other Ambulatory Visit: Payer: Self-pay | Admitting: Physician Assistant

## 2022-06-28 DIAGNOSIS — I10 Essential (primary) hypertension: Secondary | ICD-10-CM

## 2022-06-28 MED ORDER — ESTROGENS CONJUGATED 1.25 MG PO TABS: ORAL_TABLET | ORAL | 0 refills | Status: DC

## 2022-06-28 NOTE — Telephone Encounter (Signed)
Pt need follow up appt

## 2022-09-02 ENCOUNTER — Other Ambulatory Visit: Payer: Self-pay | Admitting: Physician Assistant

## 2022-09-02 DIAGNOSIS — F5101 Primary insomnia: Secondary | ICD-10-CM

## 2022-09-27 ENCOUNTER — Ambulatory Visit: Payer: Medicare Other | Admitting: Physician Assistant

## 2022-10-02 ENCOUNTER — Other Ambulatory Visit: Payer: Self-pay | Admitting: Physician Assistant

## 2022-10-02 DIAGNOSIS — I10 Essential (primary) hypertension: Secondary | ICD-10-CM

## 2022-10-02 NOTE — Telephone Encounter (Signed)
Pt need appt for refills  ?

## 2022-10-04 ENCOUNTER — Other Ambulatory Visit: Payer: Self-pay | Admitting: Physician Assistant

## 2022-10-04 DIAGNOSIS — R32 Unspecified urinary incontinence: Secondary | ICD-10-CM

## 2022-10-04 NOTE — Telephone Encounter (Signed)
Pt need appt for refills  ?

## 2022-10-07 ENCOUNTER — Telehealth: Payer: Self-pay | Admitting: Internal Medicine

## 2022-10-07 ENCOUNTER — Ambulatory Visit: Payer: Medicare Other | Admitting: Physician Assistant

## 2022-10-07 NOTE — Telephone Encounter (Signed)
Patient discharged from practice. Mailed letter to patient-Misty Robbins

## 2022-11-01 ENCOUNTER — Other Ambulatory Visit: Payer: Self-pay | Admitting: Physician Assistant

## 2022-11-09 ENCOUNTER — Other Ambulatory Visit: Payer: Self-pay | Admitting: Physician Assistant

## 2022-11-09 DIAGNOSIS — R32 Unspecified urinary incontinence: Secondary | ICD-10-CM

## 2022-12-02 ENCOUNTER — Encounter: Payer: Self-pay | Admitting: Physician Assistant

## 2022-12-02 ENCOUNTER — Ambulatory Visit (INDEPENDENT_AMBULATORY_CARE_PROVIDER_SITE_OTHER): Payer: 59 | Admitting: Physician Assistant

## 2022-12-02 VITALS — BP 158/105 | HR 63 | Wt 149.5 lb

## 2022-12-02 DIAGNOSIS — E7800 Pure hypercholesterolemia, unspecified: Secondary | ICD-10-CM

## 2022-12-02 DIAGNOSIS — E8940 Asymptomatic postprocedural ovarian failure: Secondary | ICD-10-CM

## 2022-12-02 DIAGNOSIS — F5101 Primary insomnia: Secondary | ICD-10-CM | POA: Diagnosis not present

## 2022-12-02 DIAGNOSIS — M5442 Lumbago with sciatica, left side: Secondary | ICD-10-CM

## 2022-12-02 DIAGNOSIS — Z780 Asymptomatic menopausal state: Secondary | ICD-10-CM

## 2022-12-02 DIAGNOSIS — I10 Essential (primary) hypertension: Secondary | ICD-10-CM | POA: Diagnosis not present

## 2022-12-02 DIAGNOSIS — F319 Bipolar disorder, unspecified: Secondary | ICD-10-CM

## 2022-12-02 MED ORDER — ESTROGENS CONJUGATED 1.25 MG PO TABS: ORAL_TABLET | ORAL | 0 refills | Status: DC

## 2022-12-02 MED ORDER — METOPROLOL TARTRATE 50 MG PO TABS: ORAL_TABLET | ORAL | 0 refills | Status: DC

## 2022-12-02 MED ORDER — TRAZODONE HCL 100 MG PO TABS: ORAL_TABLET | ORAL | 1 refills | Status: DC

## 2022-12-02 NOTE — Progress Notes (Unsigned)
Misty Robbins,acting as a Education administrator for [employer], PA-C.,have documented all relevant documentation on the behalf of Misty Speak, PA-C,as directed by  [employer], PA-C while in the presence of [employer], PA-C.    New patient visit   Patient: Misty Robbins   DOB: 1971/02/11   52 y.o. Female  MRN: [id number] Visit Date: 12/02/2022  Today's healthcare provider: Mardene Speak, PA-C   CC: establish care, med refill for chronic med conditions  Subjective    Misty Robbins is a 52 y.o. female who presents today as a new patient to establish care.  HPI  Pt was advised to  -Covid Vaccine: declined -HIV and Hep C Screening: declined -Tetanus Vaccine: declined -Influenza Vaccine: declined -Colonoscopy: 04/13/2018 -Patient reports she is in need of medication refills. Patient was previously seeing Misty Robbins at [facility] and released after missed visits due to transportation. ROI provided - Insomnia: has been having sleeping problems, sleeps 3-4 hours at night, has been well-managed with trazodone for some time PMHx is significant for Bipolar 1 disorder, Depression, Anxiety - hypertension: takes metoprolol with significant control of BP, tried in the past lisinopril, and Robbins BP dropped down significantly, stopped taking this medication. Also, has elevated cholesterol , does not take any cholesterol medication - Underwent total hysterectomy  in    , has been taking estrogen for hot flashes. - Arthritis/lower back pain, had a few epidural shots, does symptomatic treatment with  topical antiinflammatory cream, heating pad and massage. Osteoporosis was diagnosed by imaging. - Mitral valve regurgitation, tricuspid valve regurgitations: last time was seen by Cardiology some time ago.  Past Medical History:  Diagnosis Date   Allergy    Anxiety    Arthritis    Asthma    Bipolar 1 disorder (HCC)    Chest pain    Depression    GERD (gastroesophageal reflux disease)    History of  kidney stones    Hyperlipidemia    Hypertension    Insomnia    Mitral valve regurgitation    Osteoporosis    Peripheral neuropathy    Tricuspid valve regurgitation    Past Surgical History:  Procedure Laterality Date   ABDOMINAL HYSTERECTOMY  2000   APPENDECTOMY     BACK SURGERY     CERVICAL FUSION  2011,2012   c2-7   COLONOSCOPY WITH PROPOFOL N/A 04/13/2018   Procedure: COLONOSCOPY WITH PROPOFOL;  Surgeon: Misty Lame, MD;  Location: [facility];  Service: Endoscopy;  Laterality: N/A;   ESOPHAGOGASTRODUODENOSCOPY (EGD) WITH PROPOFOL N/A 04/13/2018   Procedure: ESOPHAGOGASTRODUODENOSCOPY (EGD) WITH PROPOFOL;  Surgeon: Misty Lame, MD;  Location: [facility];  Service: Endoscopy;  Laterality: N/A;   LAPAROSCOPY N/A 05/25/2018   Procedure: LAPAROSCOPY DIAGNOSTIC;  Surgeon: Misty Sills [name], MD;  Location: [facility];  Service: Gynecology;  Laterality: N/A;   LYSIS OF ADHESION N/A 05/25/2018   Procedure: LYSIS OF ADHESION;  Surgeon: Schermerhorn, [name], MD;  Location: [facility];  Service: Gynecology;  Laterality: N/A;   POLYPECTOMY  04/13/2018   Procedure: POLYPECTOMY;  Surgeon: Misty Lame, MD;  Location: [facility];  Service: Endoscopy;;   SKIN GRAFT  1981   from right thigh to bilateral ankles and right knee   TONSILLECTOMY  2013   TRACHELECTOMY N/A 05/25/2018   Procedure: TRACHELECTOMY;  Surgeon: Schermerhorn, [name], MD;  Location: [facility];  Service: Gynecology;  Laterality: N/A;   Family Status  Relation Name Status   Ethlyn Daniels  (Not Specified)  Mother  Deceased   Father  Deceased   Sister  (Not Specified)   Brother  (Not Specified)   Family History  Problem Relation Age of Onset   Breast cancer Paternal Aunt        73's   Heart disease Mother    Hypertension Mother    Stroke Mother    Stroke Father    Cancer Father    Heart disease Father    Diabetes Sister    Diabetes Brother    Social History   Socioeconomic History    Marital status: Married    Spouse name: Not on file   Number of children: Not on file   Years of education: Not on file   Highest education level: Not on file  Occupational History   Not on file  Tobacco Use   Smoking status: Former    Packs/day: 0.50    Years: 25.00    Total pack years: 12.50    Types: Cigarettes   Smokeless tobacco: Never   Tobacco comments:    not since september 30  Vaping Use   Vaping Use: Never used  Substance and Sexual Activity   Alcohol use: Not Currently   Drug use: No   Sexual activity: Not on file  Other Topics Concern   Not on file  Social History Narrative   Not on file   Social Determinants of Health   Financial Resource Strain: Not on file  Food Insecurity: Not on file  Transportation Needs: Not on file  Physical Activity: Not on file  Stress: Not on file  Social Connections: Not on file   Outpatient Medications Prior to Visit  Medication Sig   albuterol (PROVENTIL HFA;VENTOLIN HFA) 108 (90 Base) MCG/ACT inhaler Inhale into the lungs every 6 (six) hours as needed for wheezing or shortness of breath.   cyclobenzaprine (FLEXERIL) 10 MG tablet TAKE 1 TABLET(10 MG) BY MOUTH EVERY NIGHT AT BEDTIME AS NEEDED FOR BACK SPASM   DULoxetine (CYMBALTA) 20 MG capsule Take 1 capsule (20 mg total) by mouth daily.   estrogens, conjugated, (PREMARIN) 1.25 MG tablet TAKE 1 TABLET(1.25 MG) BY MOUTH DAILY   lisinopril (ZESTRIL) 20 MG tablet TAKE 1 TABLET BY MOUTH AT NIGHT FOR BLOOD PRESSURE   lisinopril-hydrochlorothiazide (ZESTORETIC) 20-12.5 MG tablet Take 1 tablet by mouth daily.   metoprolol tartrate (LOPRESSOR) 50 MG tablet TAKE 1 TABLET(50 MG) BY MOUTH TWICE DAILY   pravastatin (PRAVACHOL) 40 MG tablet TAKE 1 TABLET(40 MG) BY MOUTH DAILY   solifenacin (VESICARE) 10 MG tablet TAKE 1 TABLET(10 MG) BY MOUTH DAILY   traZODone (DESYREL) 100 MG tablet TAKE 1 TABLET BY MOUTH EVERY NIGHT AT BEDTIME FOR SLEEP   No facility-administered medications prior to  visit.   Allergies  Allergen Reactions   Caffeine     "exaggerated stimulant effect"   Hydrocodone Nausea And Vomiting    PROJECTILE VOMITING   Pseudoephedrine Other (See Comments)    HALLUCINATIONS    There is no immunization history for the selected administration types on file for this patient.  Health Maintenance  Topic Date Due   COVID-19 Vaccine (1) Never done   HIV Screening  Never done   Diabetic kidney evaluation - Urine ACR  Never done   Hepatitis C Screening  Never done   DTaP/Tdap/Td (1 - Tdap) Never done   HEMOGLOBIN A1C  03/25/2021   INFLUENZA VACCINE  Never done   Diabetic kidney evaluation - eGFR measurement  10/05/2022   Medicare Annual  Wellness (AWV)  10/05/2022   COLONOSCOPY (Pts 45-34yr Insurance coverage will need to be confirmed)  04/13/2028   HPV VACCINES  Aged Out   FOOT EXAM  Discontinued   PAP SMEAR-Modifier  Discontinued   OPHTHALMOLOGY EXAM  Discontinued    Patient Care Team: Pcp, No as PCP - General KLavera Guise MD (Internal Medicine) SChristene Lye MD (General Surgery)  Review of Systems  Constitutional:  Positive for diaphoresis and fatigue.  Cardiovascular:  Positive for palpitations.  Endocrine: Positive for heat intolerance.  Genitourinary:  Positive for difficulty urinating and dysuria.  Musculoskeletal:  Positive for arthralgias, back pain, gait problem and neck pain.  Psychiatric/Behavioral:  Positive for decreased concentration and sleep disturbance. The patient is hyperactive.     {Labs  Heme  Chem  Endocrine  Serology  Results Review (optional):23779}   Objective    There were no vitals taken for this visit. {Show previous vital signs (optional):23777}  Physical Exam Vitals reviewed.  Constitutional:      General: She is not in acute distress.    Appearance: Normal appearance. She is well-developed. She is not diaphoretic.  HENT:     Head: Normocephalic and atraumatic.  Eyes:     General: No scleral  icterus.    Conjunctiva/sclera: Conjunctivae normal.  Neck:     Thyroid: No thyromegaly.  Cardiovascular:     Rate and Rhythm: Normal rate and regular rhythm.     Pulses: Normal pulses.     Heart sounds: Normal heart sounds. No murmur heard. Pulmonary:     Effort: Pulmonary effort is normal. No respiratory distress.     Breath sounds: Normal breath sounds. No wheezing, rhonchi or rales.  Musculoskeletal:     Cervical back: Neck supple.     Right lower leg: No edema.     Left lower leg: No edema.  Lymphadenopathy:     Cervical: No cervical adenopathy.  Skin:    General: Skin is warm and dry.     Findings: No rash.  Neurological:     Mental Status: She is alert and oriented to person, place, and time. Mental status is at baseline.  Psychiatric:        Mood and Affect: Mood normal.        Behavior: Behavior normal.     Depression Screen    01/04/2022    9:29 AM 10/05/2021    9:07 AM 02/23/2021    9:53 AM 10/04/2020    8:50 AM  PHQ 2/9 Scores  PHQ - 2 Score  0 0 0  Exception Documentation Medical reason       No results found for any visits on 12/02/22.  Assessment & Plan     1. Essential hypertension Chronic, elevated today, not at goal Previously variable Per chart review, BP was WNL  -Metoprolol , see below Advised to measure of BP at home - Comprehensive metabolic panel - Lipid panel - TSH - CBC with Differential/Platelet - metoprolol tartrate (LOPRESSOR) 50 MG tablet; TAKE 1 TABLET(50 MG) BY MOUTH TWICE DAILY  Dispense: 30 tablet; Refill: 0 - Ambulatory referral to Cardiology for an evaluation of complicated cardiovascular condiitions Will FU in 2 weeks  2. Primary insomnia Chronic and stable Continue taking - traZODone (DESYREL) 100 MG tablet; TAKE 1 TABLET BY MOUTH EVERY NIGHT AT BEDTIME FOR SLEEP  Dispense: 90 tablet; Refill: 1  Needs to update labs Will reassess after receiving lab results  3. Elevated cholesterol Chronic. Needs updates Advised to  adhere to low-carb, low-fat and low-caloried diet and daily exercise Will FU  4. Menopause After total hysterectomy Continue with - estrogens, conjugated, (PREMARIN) 1.25 MG tablet; TAKE 1 TABLET(1.25 MG) BY MOUTH DAILY  Dispense: 30 tablet; Refill: 0 Will check osteoporosis Advised Ca and Vit D OTC supplements Will FU  5. Bipolar and related disorder ([place]) Chronic and stable? Pt is not taking any medications. - Ambulatory referral to Psychiatry for an evaluation - Ambulatory referral to Psychology for counseling. Will reassess  6. Midline low back pain with left-sided sciatica, unspecified chronicity Chronic Has been having troubles  - Ambulatory referral to Physical Therapy Advised symptomatic treatment: heating pads, topical antiinflammatory gel, massage, PT Will need to check with pt if she wants to be referred to Ortho Will FU  7. Stress incontinence Chronic On cough, laugh Has two children, via vaginal birth Was diagnosed by gynecology some time ago Referral to urogynecology will be placed. The patient was advised to call back or seek an in-person evaluation if the symptoms worsen or if the condition fails to improve as anticipated.  I discussed the assessment and treatment plan with the patient. The patient was provided an opportunity to ask questions and all were answered. The patient agreed with the plan and demonstrated an understanding of the instructions.  The entirety of the information documented in the History of Present Illness, Review of Systems and Physical Exam were personally obtained by me. Portions of this information were initially documented by the CMA and reviewed by me for thoroughness and accuracy.  Misty Robbins, [facility], [place] [phone] (phone) [phone] (fax)

## 2022-12-03 ENCOUNTER — Other Ambulatory Visit: Payer: Self-pay

## 2022-12-03 ENCOUNTER — Emergency Department: Payer: 59

## 2022-12-03 ENCOUNTER — Emergency Department
Admission: EM | Admit: 2022-12-03 | Discharge: 2022-12-03 | Disposition: A | Discharge status: Home / Self Care | Payer: 59 | Attending: Student in an Organized Health Care Education/Training Program | Admitting: Student in an Organized Health Care Education/Training Program

## 2022-12-03 DIAGNOSIS — M7989 Other specified soft tissue disorders: Secondary | ICD-10-CM | POA: Diagnosis not present

## 2022-12-03 DIAGNOSIS — X501XXA Overexertion from prolonged static or awkward postures, initial encounter: Secondary | ICD-10-CM | POA: Diagnosis not present

## 2022-12-03 DIAGNOSIS — M25572 Pain in left ankle and joints of left foot: Secondary | ICD-10-CM | POA: Diagnosis not present

## 2022-12-03 DIAGNOSIS — S93402A Sprain of unspecified ligament of left ankle, initial encounter: Secondary | ICD-10-CM

## 2022-12-03 DIAGNOSIS — S93492A Sprain of other ligament of left ankle, initial encounter: Secondary | ICD-10-CM | POA: Insufficient documentation

## 2022-12-03 DIAGNOSIS — I10 Essential (primary) hypertension: Secondary | ICD-10-CM | POA: Diagnosis not present

## 2022-12-03 MED ORDER — MELOXICAM 7.5 MG PO TABS
15.0000 mg | ORAL_TABLET | Freq: Once | ORAL | Status: AC
  Administered 2022-12-03: 15 mg via ORAL
  Filled 2022-12-03: qty 2

## 2022-12-03 MED ORDER — MELOXICAM 15 MG PO TABS: 15.0000 mg | ORAL_TABLET | Freq: Every day | ORAL | 11 refills | Status: DC

## 2022-12-03 NOTE — Discharge Instructions (Addendum)
-  Your x-rays not show any signs of broken bones.  I suspect you likely have a ankle sprain.  Please review the educational material, as well as the rehabilitation exercises.  You may take meloxicam as needed for pain.  You may additionally add Tylenol if needed.  You may use the cam boot as needed while ambulating for comfort.  -If your symptoms fail to improve after a few weeks, please follow-up with the podiatrist listed on this page.  -Return to the emergency department anytime if you begin to experience any new or worsening symptoms.

## 2022-12-03 NOTE — ED Provider Notes (Signed)
[facility] Provider Note    Event Date/Time   First MD Initiated Contact with Patient 12/03/22 1535     (approximate)   History   Chief Complaint Ankle Pain   HPI Misty Robbins is a 52 y.o. female, hypertension, mitral valve disorder, GERD, hyperlipidemia, presents to the emergency department for evaluation of left ankle pain.  She states that she was stepping backwards when she accidentally rolled it inwards, reportedly hearing a "pop".  Since then, she has had difficulty ambulating on it.  This event occurred earlier today.  Denies fever/chills, chest pain, shortness of breath, paresthesias, cold sensation, weakness, or dizziness/lightheadedness.  History Limitations: No limitations.        Physical Exam  Triage Vital Signs: ED Triage Vitals  Enc Vitals Group     BP 12/03/22 1407 (!) 195/99     Pulse Rate 12/03/22 1407 (!) 58     Resp 12/03/22 1407 18     Temp 12/03/22 1407 98.3 F (36.8 C)     Temp Source 12/03/22 1407 Oral     SpO2 12/03/22 1407 99 %     Weight 12/03/22 1407 148 lb (67.1 kg)     Height 12/03/22 1407 '5\' 7"'$  (1.702 m)     Head Circumference --      Peak Flow --      Pain Score 12/03/22 1406 7     Pain Loc --      Pain Edu? --      Excl. in [place]? --     Most recent vital signs: Vitals:   12/03/22 1407  BP: (!) 195/99  Pulse: (!) 58  Resp: 18  Temp: 98.3 F (36.8 C)  SpO2: 99%    General: Awake, NAD.  Skin: Warm, dry. No rashes or lesions.  Eyes: PERRL. Conjunctivae normal.  CV: Good peripheral perfusion.  Resp: Normal effort.  Abd: Soft, non-tender. No distention.  Neuro: At baseline. No gross neurological deficits.  Musculoskeletal: Normal ROM of all extremities.  Focused Exam: No gross deformities to the left lower extremity.  No significant soft tissue swelling.  Mild tenderness along the posterior aspect of the lateral malleolus.  PMS intact distally.  No joint laxity.  Physical Exam    ED Results /  Procedures / Treatments  Labs (all labs ordered are listed, but only abnormal results are displayed) Labs Reviewed - No data to display   EKG N/A.    RADIOLOGY  ED Provider Interpretation: I personally viewed and interpreted this x-ray, no evidence of acute fractures or dislocations.  DG Ankle Complete Left  Result Date: 12/03/2022 CLINICAL DATA:  Pain after fall EXAM: LEFT ANKLE COMPLETE - 3+ VIEW COMPARISON:  None Available. FINDINGS: There is no evidence of fracture, dislocation, or joint effusion. There is no evidence of arthropathy or other focal bone abnormality. Soft tissues swelling anterior to the tibiotalar joint. IMPRESSION: Anterior soft tissue swelling. No fracture or dislocation. Electronically Signed   By: Lucrezia Europe M.D.   On: 12/03/2022 14:21    PROCEDURES:  Critical Care performed: N/A.  Procedures    MEDICATIONS ORDERED IN ED: Medications  meloxicam (MOBIC) tablet 15 mg (has no administration in time range)     IMPRESSION / MDM / ASSESSMENT AND PLAN / ED COURSE  I reviewed the triage vital signs and the nursing notes.  Differential diagnosis includes, but is not limited to, ankle sprain, medial malleolus fracture, lateral malleolus fracture, ankle dislocation.  Assessment/Plan Presentation consistent with ankle sprain.  No evidence of significant soft tissue injuries on physical exam.  No joint laxity.  X-rays not show any evidence of fractures or dislocations.  She still able to ambulate.  Will provide her with a cam boot for comfort, as well as a prescription for meloxicam.  Provided her with a referral to podiatry to follow-up with if her symptoms fail to improve after a few weeks.  She was amenable to this plan.  Will discharge.  Provided the patient with anticipatory guidance, return precautions, and educational material. Encouraged the patient to return to the emergency department at any time if they begin to experience  any new or worsening symptoms. Patient expressed understanding and agreed with the plan.   Patient's presentation is most consistent with acute complicated illness / injury requiring diagnostic workup.       FINAL CLINICAL IMPRESSION(S) / ED DIAGNOSES   Final diagnoses:  Sprain of left ankle, unspecified ligament, initial encounter     Rx / DC Orders   ED Discharge Orders          Ordered    meloxicam (MOBIC) 15 MG tablet  Daily        12/03/22 1655             Note:  This document was prepared using Dragon voice recognition software and may include unintentional dictation errors.   Teodoro Spray, [place] 12/03/22 1658    Merlyn Lot, MD 12/03/22 [phone]

## 2022-12-03 NOTE — ED Triage Notes (Signed)
Pt comes with c/o left ankle pain. Pt states she stepped back and felt it roll. Pt denies hearing it pop.

## 2022-12-04 DIAGNOSIS — F5101 Primary insomnia: Secondary | ICD-10-CM | POA: Insufficient documentation

## 2022-12-04 DIAGNOSIS — E8940 Asymptomatic postprocedural ovarian failure: Secondary | ICD-10-CM | POA: Insufficient documentation

## 2022-12-04 DIAGNOSIS — Z780 Asymptomatic menopausal state: Secondary | ICD-10-CM | POA: Insufficient documentation

## 2022-12-04 DIAGNOSIS — F319 Bipolar disorder, unspecified: Secondary | ICD-10-CM | POA: Insufficient documentation

## 2022-12-04 DIAGNOSIS — M5442 Lumbago with sciatica, left side: Secondary | ICD-10-CM | POA: Insufficient documentation

## 2022-12-04 DIAGNOSIS — E7800 Pure hypercholesterolemia, unspecified: Secondary | ICD-10-CM | POA: Insufficient documentation

## 2022-12-09 DIAGNOSIS — I10 Essential (primary) hypertension: Secondary | ICD-10-CM | POA: Diagnosis not present

## 2022-12-10 ENCOUNTER — Other Ambulatory Visit: Payer: Self-pay | Admitting: Physician Assistant

## 2022-12-10 DIAGNOSIS — Z1231 Encounter for screening mammogram for malignant neoplasm of breast: Secondary | ICD-10-CM

## 2022-12-10 LAB — LIPID PANEL
Chol/HDL Ratio: 5.3 ratio — ABNORMAL HIGH (ref 0.0–4.4)
Cholesterol, Total: 259 mg/dL — ABNORMAL HIGH (ref 100–199)
HDL: 49 mg/dL (ref >39)
LDL Chol Calc (NIH): 173 mg/dL — ABNORMAL HIGH (ref 0–99)
Triglycerides: 201 mg/dL — ABNORMAL HIGH (ref 0–149)
VLDL Cholesterol Cal: 37 mg/dL (ref 5–40)

## 2022-12-10 LAB — COMPREHENSIVE METABOLIC PANEL
ALT: 11 IU/L (ref 0–32)
AST: 20 IU/L (ref 0–40)
Albumin/Globulin Ratio: 1.5 (ref 1.2–2.2)
Albumin: 4.3 g/dL (ref 3.9–4.9)
Alkaline Phosphatase: 125 IU/L — ABNORMAL HIGH (ref 44–121)
BUN/Creatinine Ratio: 13 (ref 9–23)
BUN: 13 mg/dL (ref 6–24)
Bilirubin Total: 0.4 mg/dL (ref 0.0–1.2)
CO2: 22 mmol/L (ref 20–29)
Calcium: 9.6 mg/dL (ref 8.7–10.2)
Chloride: 102 mmol/L (ref 96–106)
Creatinine, Ser: 1.04 mg/dL — ABNORMAL HIGH (ref 0.57–1.00)
Globulin, Total: 2.9 g/dL (ref 1.5–4.5)
Glucose: 106 mg/dL — ABNORMAL HIGH (ref 70–99)
Potassium: 4.3 mmol/L (ref 3.5–5.2)
Sodium: 141 mmol/L (ref 134–144)
Total Protein: 7.2 g/dL (ref 6.0–8.5)
eGFR: 66 mL/min/{1.73_m2} (ref >59)

## 2022-12-10 LAB — CBC WITH DIFFERENTIAL/PLATELET
Basophils Absolute: 0.1 10*3/uL (ref 0.0–0.2)
Basos: 1 %
EOS (ABSOLUTE): 0.5 10*3/uL — ABNORMAL HIGH (ref 0.0–0.4)
Eos: 5 %
Hematocrit: 40.6 % (ref 34.0–46.6)
Hemoglobin: 13.5 g/dL (ref 11.1–15.9)
Immature Grans (Abs): 0 10*3/uL (ref 0.0–0.1)
Immature Granulocytes: 0 %
Lymphocytes Absolute: 3.5 10*3/uL — ABNORMAL HIGH (ref 0.7–3.1)
Lymphs: 39 %
MCH: 29.8 pg (ref 26.6–33.0)
MCHC: 33.3 g/dL (ref 31.5–35.7)
MCV: 90 fL (ref 79–97)
Monocytes Absolute: 0.7 10*3/uL (ref 0.1–0.9)
Monocytes: 8 %
Neutrophils Absolute: 4.3 10*3/uL (ref 1.4–7.0)
Neutrophils: 47 %
Platelets: 239 10*3/uL (ref 150–450)
RBC: 4.53 x10E6/uL (ref 3.77–5.28)
RDW: 12 % (ref 11.7–15.4)
WBC: 9 10*3/uL (ref 3.4–10.8)

## 2022-12-10 LAB — TSH: TSH: 1.64 u[IU]/mL (ref 0.450–4.500)

## 2022-12-11 NOTE — Progress Notes (Signed)
Please, let pt know that her lab results normal except, slight elevation of alkaline phos phatase, we could recheck at her next FU, her kidney function slightly decreased, she needs to drink 8-12 glasses of water every day, avoid NSAIDs/meloxicam, and have renal functions check avery 6-12 months to ensure stability.Her lipids are elevated, The 10-year ASCVD risk score (risk for heart attack and stroke) is: 10.8% . Might need to increase a dose of your cholesterol. We could discuss this matter at her FU.

## 2022-12-12 ENCOUNTER — Telehealth: Payer: Self-pay | Admitting: Internal Medicine

## 2022-12-12 NOTE — Telephone Encounter (Signed)
Received MR request from [facility]

## 2022-12-12 NOTE — Telephone Encounter (Signed)
MR (211 pages) mailed to [facility]: [address] [place], [place]

## 2022-12-17 ENCOUNTER — Ambulatory Visit (INDEPENDENT_AMBULATORY_CARE_PROVIDER_SITE_OTHER): Payer: 59 | Admitting: Physician Assistant

## 2022-12-17 ENCOUNTER — Other Ambulatory Visit: Payer: Self-pay | Admitting: Physician Assistant

## 2022-12-17 VITALS — BP 147/88 | HR 50 | Temp 97.9°F | Wt 149.0 lb

## 2022-12-17 DIAGNOSIS — E7800 Pure hypercholesterolemia, unspecified: Secondary | ICD-10-CM | POA: Diagnosis not present

## 2022-12-17 DIAGNOSIS — F319 Bipolar disorder, unspecified: Secondary | ICD-10-CM

## 2022-12-17 DIAGNOSIS — M5442 Lumbago with sciatica, left side: Secondary | ICD-10-CM

## 2022-12-17 DIAGNOSIS — I10 Essential (primary) hypertension: Secondary | ICD-10-CM

## 2022-12-17 DIAGNOSIS — N393 Stress incontinence (female) (male): Secondary | ICD-10-CM

## 2022-12-17 DIAGNOSIS — F5101 Primary insomnia: Secondary | ICD-10-CM

## 2022-12-17 MED ORDER — METOPROLOL TARTRATE 50 MG PO TABS: ORAL_TABLET | ORAL | 0 refills | Status: DC

## 2022-12-17 MED ORDER — AMLODIPINE BESYLATE 2.5 MG PO TABS: 2.5000 mg | ORAL_TABLET | Freq: Every day | ORAL | 0 refills | Status: DC

## 2022-12-17 NOTE — Progress Notes (Unsigned)
Established patient visit   Patient: Misty Robbins   DOB: [birth year]   52 y.o. Female  MRN: [id number] Visit Date: 12/17/2022  Today's healthcare provider: Mardene Speak, PA-C   No chief complaint on file.  Subjective    HPI  Hypertension, follow-up  BP Readings from Last 3 Encounters:  12/17/22 (!) 147/88  12/03/22 (!) 192/94  12/02/22 (!) 158/105   Wt Readings from Last 3 Encounters:  12/17/22 149 lb (67.6 kg)  12/03/22 148 lb (67.1 kg)  12/02/22 149 lb 8 oz (67.8 kg)     She was last seen for hypertension 2 weeks ago.  BP at that visit was as above. Management since that visit includes none.  She reports poor compliance with treatment. She is having side effects. Patient felt like she was going to pass out on the Lisinopril.  She has since stopped taking it. She is following a Low Sodium diet. She is not exercising. She does not smoke.  Use of agents associated with hypertension: NSAIDS.   Outside blood pressures are 130-170 over 70-100. Symptoms: No chest pain No chest pressure  Yes palpitations No syncope  No dyspnea No orthopnea  No paroxysmal nocturnal dyspnea No lower extremity edema   Pertinent labs Lab Results  Component Value Date   CHOL 259 (H) 12/09/2022   HDL 49 12/09/2022   LDLCALC 173 (H) 12/09/2022   TRIG 201 (H) 12/09/2022   CHOLHDL 5.3 (H) 12/09/2022   Lab Results  Component Value Date   NA 141 12/09/2022   K 4.3 12/09/2022   CREATININE 1.04 (H) 12/09/2022   EGFR 66 12/09/2022   GLUCOSE 106 (H) 12/09/2022   TSH 1.640 12/09/2022     The 10-year ASCVD risk score (Arnett DK, et al., 2019) is: 6.4%  ---------------------------------------------------------------------------------------------------   Medications: Outpatient Medications Prior to Visit  Medication Sig   estrogens, conjugated, (PREMARIN) 1.25 MG tablet TAKE 1 TABLET(1.25 MG) BY MOUTH DAILY   meloxicam (MOBIC) 15 MG tablet Take 1 tablet (15 mg total) by mouth  daily.   metoprolol tartrate (LOPRESSOR) 50 MG tablet TAKE 1 TABLET(50 MG) BY MOUTH TWICE DAILY   solifenacin (VESICARE) 10 MG tablet TAKE 1 TABLET(10 MG) BY MOUTH DAILY   traZODone (DESYREL) 100 MG tablet TAKE 1 TABLET BY MOUTH EVERY NIGHT AT BEDTIME FOR SLEEP   albuterol (PROVENTIL HFA;VENTOLIN HFA) 108 (90 Base) MCG/ACT inhaler Inhale into the lungs every 6 (six) hours as needed for wheezing or shortness of breath. (Patient not taking: Reported on 12/02/2022)   cyclobenzaprine (FLEXERIL) 10 MG tablet TAKE 1 TABLET(10 MG) BY MOUTH EVERY NIGHT AT BEDTIME AS NEEDED FOR BACK SPASM (Patient not taking: Reported on 12/02/2022)   lisinopril (ZESTRIL) 20 MG tablet TAKE 1 TABLET BY MOUTH AT NIGHT FOR BLOOD PRESSURE (Patient not taking: Reported on 12/02/2022)   lisinopril-hydrochlorothiazide (ZESTORETIC) 20-12.5 MG tablet Take 1 tablet by mouth daily. (Patient not taking: Reported on 12/02/2022)   pravastatin (PRAVACHOL) 40 MG tablet TAKE 1 TABLET(40 MG) BY MOUTH DAILY (Patient not taking: Reported on 12/02/2022)   No facility-administered medications prior to visit.    Review of Systems  All other systems reviewed and are negative. Except see HPI   {Labs  Heme  Chem  Endocrine  Serology  Results Review (optional):23779}   Objective    BP (!) 147/88 (BP Location: Left Arm, Patient Position: Sitting, Cuff Size: Normal)   Pulse (!) 50   Temp 97.9 F (36.6 C) (Oral)   Wt  149 lb (67.6 kg)   SpO2 100%   BMI 23.34 kg/m  {Show previous vital signs (optional):23777}  Physical Exam Vitals reviewed.  Constitutional:      General: She is not in acute distress.    Appearance: Normal appearance. She is well-developed. She is not diaphoretic.  HENT:     Head: Normocephalic and atraumatic.  Eyes:     General: No scleral icterus.    Conjunctiva/sclera: Conjunctivae normal.  Neck:     Thyroid: No thyromegaly.  Cardiovascular:     Rate and Rhythm: Normal rate and regular rhythm.     Pulses: Normal  pulses.     Heart sounds: Normal heart sounds. No murmur heard. Pulmonary:     Effort: Pulmonary effort is normal. No respiratory distress.     Breath sounds: Normal breath sounds. No wheezing, rhonchi or rales.  Musculoskeletal:     Cervical back: Neck supple.     Right lower leg: No edema.     Left lower leg: No edema.  Lymphadenopathy:     Cervical: No cervical adenopathy.  Skin:    General: Skin is warm and dry.     Findings: No rash.  Neurological:     Mental Status: She is alert and oriented to person, place, and time. Mental status is at baseline.  Psychiatric:        Mood and Affect: Mood normal.        Behavior: Behavior normal.     ***  No results found for any visits on 12/17/22.  Assessment & Plan     1. Elevated cholesterol *** - AMB Referral to Chronic Care Management Services  2. Primary insomnia *** - AMB Referral to Chronic Care Management Services  3. Essential hypertension *** - amLODipine (NORVASC) 2.5 MG tablet; Take 1 tablet (2.5 mg total) by mouth daily.  Dispense: 90 tablet; Refill: 0 - AMB Referral to Chronic Care Management Services - metoprolol tartrate (LOPRESSOR) 50 MG tablet; TAKE 1 TABLET(50 MG) BY MOUTH TWICE DAILY  Dispense: 90 tablet; Refill: 0  4. Midline low back pain with left-sided sciatica, unspecified chronicity *** - AMB Referral to Chronic Care Management Services  5. Bipolar and related disorder ([place]) *** - AMB Referral to Chronic Care Management Services  6. Stress incontinence *** - AMB Referral to Chronic Care Management Services - Ambulatory referral to Physical Therapy   No follow-ups on file.      The patient was advised to call back or seek an in-person evaluation if the symptoms worsen or if the condition fails to improve as anticipated.  I discussed the assessment and treatment plan with the patient. The patient was provided an opportunity to ask questions and all were answered. The patient agreed with the  plan and demonstrated an understanding of the instructions.  I, Mardene Speak, PA-C have reviewed all documentation for this visit. The documentation on  12/17/22 for the exam, diagnosis, procedures, and orders are all accurate and complete.  Mardene Speak, [facility], [place] [phone] (phone) [phone] (fax)

## 2022-12-17 NOTE — Progress Notes (Signed)
Cardiology Office Note:   Date:  12/20/2022  NAME:  MELONIA HAW    MRN: [id number] DOB:  09/09/1971   PCP:  Mardene Speak, PA-C  Cardiologist:  None  Electrophysiologist:  None   Referring MD: Mardene Speak, PA-C   Chief Complaint  Patient presents with   New Patient (Initial Visit)   History of Present Illness:   DERON ALLENSWORTH is a 52 y.o. female with a hx of HTN, HLD who is being seen today for the evaluation of abnormal EKG at the request of Mardene Speak, PA-C.  She reports she has hypertension.  Blood pressure has been creeping up.  Recently started on amlodipine.  BP today 132/94.  She reports she follows with a cardiologist in [place].  Dr. Yancey Flemings.  She tells me she takes metoprolol due to palpitations.  This does help.  She still does experience palpitations but they occur once per week.  They last 30 seconds.  They are associated with shortness of breath but in very short duration.  She reports no chest pain.  No pressure.  She does have chronic back pain.  Her pain level at baseline is 4 out of 10.  She does have hyperlipidemia.  LDL 173.  We discussed calcium scoring for further restratification.  She is a former smoker.  She is married.  She has several children.  She does not exercise due to chronic pain.  She does have a family history of heart disease.  CV examination normal.  Denies any major cardiac symptoms in office today.   TSH 1.64  Problem List HTN HLD -T chol 259, HDL 49, LDL 173, TG 201 3. Chronic pain 4. Palpitations -> Improved on metoprolol   Past Medical History: Past Medical History:  Diagnosis Date   Allergy    Anxiety    Arthritis    Asthma    Bipolar 1 disorder (HCC)    Chest pain    COPD (chronic obstructive pulmonary disease) (HCC)    Depression    GERD (gastroesophageal reflux disease)    History of kidney stones    Hyperlipidemia    Hypertension    Insomnia    Mitral valve regurgitation    Osteoporosis    Peripheral neuropathy     Tricuspid valve regurgitation     Past Surgical History: Past Surgical History:  Procedure Laterality Date   ABDOMINAL HYSTERECTOMY  2000   APPENDECTOMY     BACK SURGERY     CERVICAL FUSION  2011,2012   c2-7   COLONOSCOPY WITH PROPOFOL N/A 04/13/2018   Procedure: COLONOSCOPY WITH PROPOFOL;  Surgeon: Lucilla Lame, MD;  Location: [facility];  Service: Endoscopy;  Laterality: N/A;   ESOPHAGOGASTRODUODENOSCOPY (EGD) WITH PROPOFOL N/A 04/13/2018   Procedure: ESOPHAGOGASTRODUODENOSCOPY (EGD) WITH PROPOFOL;  Surgeon: Lucilla Lame, MD;  Location: [facility];  Service: Endoscopy;  Laterality: N/A;   LAPAROSCOPY N/A 05/25/2018   Procedure: LAPAROSCOPY DIAGNOSTIC;  Surgeon: Ouida Sills [name], MD;  Location: [facility];  Service: Gynecology;  Laterality: N/A;   LYSIS OF ADHESION N/A 05/25/2018   Procedure: LYSIS OF ADHESION;  Surgeon: Schermerhorn, [name], MD;  Location: [facility];  Service: Gynecology;  Laterality: N/A;   POLYPECTOMY  04/13/2018   Procedure: POLYPECTOMY;  Surgeon: Lucilla Lame, MD;  Location: [facility];  Service: Endoscopy;;   SKIN GRAFT  1981   from right thigh to bilateral ankles and right knee   [place]  2013  TRACHELECTOMY N/A 05/25/2018   Procedure: TRACHELECTOMY;  Surgeon: Schermerhorn, [name], MD;  Location: [facility];  Service: Gynecology;  Laterality: N/A;   TUBAL LIGATION      Current Medications: Current Meds  Medication Sig   amLODipine (NORVASC) 2.5 MG tablet Take 1 tablet (2.5 mg total) by mouth daily.   estrogens, conjugated, (PREMARIN) 1.25 MG tablet TAKE 1 TABLET(1.25 MG) BY MOUTH DAILY   metoprolol tartrate (LOPRESSOR) 50 MG tablet TAKE 1 TABLET(50 MG) BY MOUTH TWICE DAILY   pravastatin (PRAVACHOL) 40 MG tablet TAKE 1 TABLET(40 MG) BY MOUTH DAILY   solifenacin (VESICARE) 10 MG tablet TAKE 1 TABLET(10 MG) BY MOUTH DAILY   traZODone (DESYREL) 100 MG tablet TAKE 1 TABLET BY MOUTH EVERY NIGHT AT  BEDTIME FOR SLEEP     Allergies:    Caffeine, Hydrocodone, and Pseudoephedrine   Social History: Social History   Socioeconomic History   Marital status: Married    Spouse name: Not on file   Number of children: 2   Years of education: Not on file   Highest education level: Not on file  Occupational History   Not on file  Tobacco Use   Smoking status: Former    Packs/day: 0.50    Years: 25.00    Total pack years: 12.50    Types: Cigarettes    Quit date: 2018    Years since quitting: 6.1   Smokeless tobacco: Never   Tobacco comments:    not since september 30  Vaping Use   Vaping Use: Never used  Substance and Sexual Activity   Alcohol use: Not Currently   Drug use: No   Sexual activity: Not on file  Other Topics Concern   Not on file  Social History Narrative   Not on file   Social Determinants of Health   Financial Resource Strain: Not on file  Food Insecurity: Not on file  Transportation Needs: Not on file  Physical Activity: Not on file  Stress: Not on file  Social Connections: Not on file     Family History: The patient's family history includes Breast cancer in her paternal aunt; Cancer in her father; Diabetes in her brother and sister; Heart disease in her father and mother; Hypertension in her mother; Stroke in her father and mother.  ROS:   All other ROS reviewed and negative. Pertinent positives noted in the HPI.     EKGs/Labs/Other Studies Reviewed:   The following studies were personally reviewed by me today:  EKG:  EKG is ordered today.  The ekg ordered today demonstrates normal sinus rhythm rate 62, nonspecific ST-T changes, and was personally reviewed by me.   Recent Labs: 12/09/2022: ALT 11; BUN 13; Creatinine, Ser 1.04; Hemoglobin 13.5; Platelets 239; Potassium 4.3; Sodium 141; TSH 1.640   Recent Lipid Panel    Component Value Date/Time   CHOL 259 (H) 12/09/2022 1014   TRIG 201 (H) 12/09/2022 1014   HDL 49 12/09/2022 1014   CHOLHDL  5.3 (H) 12/09/2022 1014   LDLCALC 173 (H) 12/09/2022 1014    Physical Exam:   VS:  BP (!) 132/94 (BP Location: Left Arm, Patient Position: Sitting, Cuff Size: Normal)   Pulse 62   Ht '5\' 7"'$  (1.702 m)   Wt 148 lb 12.8 oz (67.5 kg)   SpO2 99%   BMI 23.31 kg/m    Wt Readings from Last 3 Encounters:  12/20/22 148 lb 12.8 oz (67.5 kg)  12/17/22 149 lb (67.6 kg)  12/03/22 148  lb (67.1 kg)    General: Well nourished, well developed, in no acute distress Head: Atraumatic, normal size  Eyes: PEERLA, EOMI  Neck: Supple, no JVD Endocrine: No thryomegaly Cardiac: Normal S1, S2; RRR; no murmurs, rubs, or gallops Lungs: Clear to auscultation bilaterally, no wheezing, rhonchi or rales  Abd: Soft, nontender, no hepatomegaly  Ext: No edema, pulses 2+ Musculoskeletal: No deformities, BUE and BLE strength normal and equal Skin: Warm and dry, no rashes   Neuro: Alert and oriented to person, place, time, and situation, CNII-XII grossly intact, no focal deficits  Psych: Normal mood and affect   ASSESSMENT:   VENIS BANKE is a 52 y.o. female who presents for the following: 1. Nonspecific abnormal electrocardiogram (ECG) (EKG)   2. Primary hypertension   3. SOB (shortness of breath) on exertion   4. Palpitations     PLAN:   1. Nonspecific abnormal electrocardiogram (ECG) (EKG) -Nonspecific changes.  Likely related to hypertension.  No further workup needed.  2. Primary hypertension -BP seems to be stable.  Continue amlodipine 2.5 mg daily.  On metoprolol tartrate 50 mg twice daily.  Will continue this current regimen.  Can titrate up her amlodipine as needed. -LDL cholesterol 173.  On pravastatin.  We discussed calcium scoring to help US guide treatment.  She is interested.  3. SOB (shortness of breath) on exertion 4. Palpitations -Long history of palpitations.  Improved on metoprolol.  Symptoms are infrequent.  They are short duration.  Would recommend to continue metoprolol for now.   Unclear diagnosis in the past.  Overall seems to be doing well.  Disposition: Return in about 1 year (around 12/21/2023).  Medication Adjustments/Labs and Tests Ordered: Current medicines are reviewed at length with the patient today.  Concerns regarding medicines are outlined above.  Orders Placed This Encounter  Procedures   CT CARDIAC SCORING (SELF PAY ONLY)   EKG 12-Lead   No orders of the defined types were placed in this encounter.   Patient Instructions  Medication Instructions:  The current medical regimen is effective;  continue present plan and medications.  *If you need a refill on your cardiac medications before your next appointment, please call your pharmacy*  Testing/Procedures: CALCIUM SCORE   Follow-Up: At [facility], you and your health needs are our priority.  As part of our continuing mission to provide you with exceptional heart care, we have created designated Provider Care Teams.  These Care Teams include your primary Cardiologist (physician) and Advanced Practice Providers (APPs -  Physician Assistants and Nurse Practitioners) who all work together to provide you with the care you need, when you need it.  We recommend signing up for the patient portal called "MyChart".  Sign up information is provided on this After Visit Summary.  MyChart is used to connect with patients for Virtual Visits (Telemedicine).  Patients are able to view lab/test results, encounter notes, upcoming appointments, etc.  Non-urgent messages can be sent to your provider as well.   To learn more about what you can do with MyChart, go to [email or web address].    Your next appointment:   12 month(s)  Provider:   Eleonore Chiquito, MD     Signed, Addison Naegeli. Audie Box, MD, [place]  [address], [place], [place] [address] [phone]  12/20/2022 1:41 PM

## 2022-12-18 NOTE — Telephone Encounter (Signed)
Reordered 12/17/22 and was sent to requesting pharmacy  Requested Prescriptions  Refused Prescriptions Disp Refills   metoprolol tartrate (LOPRESSOR) 50 MG tablet [Pharmacy Med Name: METOPROLOL TARTRATE 50MG TABLETS] 180 tablet     Sig: TAKE 1 TABLET(50 MG) BY MOUTH TWICE DAILY     Cardiovascular:  Beta Blockers Failed - 12/17/2022 10:13 AM      Failed - Last BP in normal range    BP Readings from Last 1 Encounters:  12/17/22 (!) 147/88         Passed - Last Heart Rate in normal range    Pulse Readings from Last 1 Encounters:  12/17/22 (!) 50         Passed - Valid encounter within last 6 months    Recent Outpatient Visits           Yesterday Elevated cholesterol   [place] [employer], [place], PA-C   2 weeks ago Essential hypertension   [place], [place], PA-C       Future Appointments             In 2 days O'Neal, Cassie Freer, MD [place] at [facility]   In 1 month [place], Letitia Libra, [place], [facility]

## 2022-12-19 ENCOUNTER — Encounter: Payer: Self-pay | Admitting: Physician Assistant

## 2022-12-20 ENCOUNTER — Ambulatory Visit: Payer: 59 | Attending: Cardiovascular Disease | Admitting: Cardiovascular Disease

## 2022-12-20 ENCOUNTER — Encounter: Payer: Self-pay | Admitting: Cardiovascular Disease

## 2022-12-20 VITALS — BP 132/94 | HR 62 | Ht 67.0 in | Wt 148.8 lb

## 2022-12-20 DIAGNOSIS — R002 Palpitations: Secondary | ICD-10-CM

## 2022-12-20 DIAGNOSIS — R9431 Abnormal electrocardiogram [ECG] [EKG]: Secondary | ICD-10-CM

## 2022-12-20 DIAGNOSIS — I10 Essential (primary) hypertension: Secondary | ICD-10-CM | POA: Diagnosis not present

## 2022-12-20 DIAGNOSIS — R0602 Shortness of breath: Secondary | ICD-10-CM

## 2022-12-20 NOTE — Patient Instructions (Signed)
Medication Instructions:  The current medical regimen is effective;  continue present plan and medications.  *If you need a refill on your cardiac medications before your next appointment, please call your pharmacy*  Testing/Procedures: CALCIUM SCORE   Follow-Up: At [facility], you and your health needs are our priority.  As part of our continuing mission to provide you with exceptional heart care, we have created designated Provider Care Teams.  These Care Teams include your primary Cardiologist (physician) and Advanced Practice Providers (APPs -  Physician Assistants and Nurse Practitioners) who all work together to provide you with the care you need, when you need it.  We recommend signing up for the patient portal called "MyChart".  Sign up information is provided on this After Visit Summary.  MyChart is used to connect with patients for Virtual Visits (Telemedicine).  Patients are able to view lab/test results, encounter notes, upcoming appointments, etc.  Non-urgent messages can be sent to your provider as well.   To learn more about what you can do with MyChart, go to [email or web address].    Your next appointment:   12 month(s)  Provider:   Eleonore Chiquito, MD

## 2022-12-27 ENCOUNTER — Ambulatory Visit
Admission: RE | Admit: 2022-12-27 | Discharge: 2022-12-27 | Disposition: A | Discharge status: Home / Self Care | Payer: 59 | Source: Ambulatory Visit | Attending: Physician Assistant | Admitting: Physician Assistant

## 2022-12-27 DIAGNOSIS — Z1231 Encounter for screening mammogram for malignant neoplasm of breast: Secondary | ICD-10-CM | POA: Diagnosis not present

## 2022-12-31 ENCOUNTER — Other Ambulatory Visit: Payer: Self-pay | Admitting: Physician Assistant

## 2022-12-31 DIAGNOSIS — Z780 Asymptomatic menopausal state: Secondary | ICD-10-CM

## 2022-12-31 NOTE — Progress Notes (Signed)
Please, let pt know that her mammogram was negative and she is advised to repeat a screening mammogram in a year.

## 2022-12-31 NOTE — Telephone Encounter (Signed)
Requested Prescriptions  Pending Prescriptions Disp Refills   estrogens, conjugated, (PREMARIN) 1.25 MG tablet [Pharmacy Med Name: PREMARIN 1.25 MG TABLET] 90 tablet 0    Sig: TAKE 1 TABLET(1.25 MG) BY MOUTH DAILY     OB/GYN:  Estrogens Failed - 12/31/2022 11:17 AM      Failed - Mammogram is up-to-date per Health Maintenance      Failed - Last BP in normal range    BP Readings from Last 1 Encounters:  12/20/22 (!) 132/94         Passed - Valid encounter within last 12 months    Recent Outpatient Visits           2 weeks ago Elevated cholesterol   [facility], Olin, PA-C   4 weeks ago Essential hypertension   [place], Brady, PA-C       Future Appointments             In 4 weeks Mardene Speak, PA-C [place], [place]

## 2023-01-02 ENCOUNTER — Telehealth: Payer: Self-pay

## 2023-01-02 NOTE — Progress Notes (Signed)
  Chronic Care Management   Note  01/02/2023 Name: Misty Robbins MRN: [id number] DOB: 10-Oct-1971  Misty Robbins is a 52 y.o. year old female who is a primary care patient of Mardene Speak, PA-C. I reached out to [employer] by phone today in response to a referral sent by Misty Robbins's PCP.  Misty Robbins was given information about Chronic Care Management services today including:  CCM service includes personalized support from designated clinical staff supervised by the physician, including individualized plan of care and coordination with other care providers 24/7 contact phone numbers for assistance for urgent and routine care needs. Service will only be billed when office clinical staff spend 20 minutes or more in a month to coordinate care. Only one practitioner may furnish and bill the service in a calendar month. The patient may stop CCM services at amy time (effective at the end of the month) by phone call to the office staff. The patient will be responsible for cost sharing (co-pay) or up to 20% of the service fee (after annual deductible is met)  Misty Robbins  declinedto scheduling an appointment with the CCM RN Case Manager   Follow up plan: Patient did not agree to scheduling an appointment with the RN Case Manager. The ordering provider has been notified.   Noreene Larsson, [place], [place] [address] Direct Dial: [phone] [email or web address]

## 2023-01-07 ENCOUNTER — Telehealth: Payer: Self-pay

## 2023-01-07 NOTE — Telephone Encounter (Signed)
Copied from [place] [id number]. Topic: General - Inquiry >> Jan 07, 2023  9:34 AM Erskine Squibb wrote: Reason for CRM: The patient is returning a call regarding Elenas voice mail. Please call her back

## 2023-01-28 ENCOUNTER — Encounter: Payer: Self-pay | Admitting: Physician Assistant

## 2023-01-28 ENCOUNTER — Ambulatory Visit (INDEPENDENT_AMBULATORY_CARE_PROVIDER_SITE_OTHER): Payer: 59 | Admitting: Physician Assistant

## 2023-01-28 VITALS — BP 145/93 | HR 70 | Ht 67.0 in | Wt 153.1 lb

## 2023-01-28 DIAGNOSIS — E7800 Pure hypercholesterolemia, unspecified: Secondary | ICD-10-CM

## 2023-01-28 DIAGNOSIS — N393 Stress incontinence (female) (male): Secondary | ICD-10-CM

## 2023-01-28 DIAGNOSIS — I10 Essential (primary) hypertension: Secondary | ICD-10-CM

## 2023-01-28 DIAGNOSIS — F319 Bipolar disorder, unspecified: Secondary | ICD-10-CM

## 2023-01-28 NOTE — Progress Notes (Unsigned)
I,Sha'taria Tyson,acting as a Education administrator for [employer], PA-C.,have documented all relevant documentation on the behalf of Mardene Speak, PA-C,as directed by  [employer], PA-C while in the presence of [employer], PA-C.   Established patient visit   Patient: Misty Robbins   DOB: 1970-12-28   52 y.o. Female  MRN: [id number] Visit Date: 01/28/2023  Today's healthcare provider: Mardene Speak, PA-C   No chief complaint on file.  Subjective    HPI  Hypertension, follow-up  BP Readings from Last 3 Encounters:  12/20/22 (!) 132/94  12/17/22 (!) 147/88  12/03/22 (!) 192/94   Wt Readings from Last 3 Encounters:  12/20/22 148 lb 12.8 oz (67.5 kg)  12/17/22 149 lb (67.6 kg)  12/03/22 148 lb (67.1 kg)     She was last seen for hypertension 6 weeks ago.  BP at that visit was 147/88. Management since that visit includes start on a new antihypertensive, she could not tolerate lisinopril and HCTZ and continue metoprolol.  She reports excellent compliance with treatment. She is not having side effects. {document side effects if present:1} She is following a Low Sodium diet. She is not exercising. She does not smoke.  Outside blood pressures are {checked 2xs daily and varies}. Symptoms: No chest pain No chest pressure  Yes palpitations No syncope  Yes dyspnea No orthopnea  No paroxysmal nocturnal dyspnea No lower extremity edema   Pertinent labs Lab Results  Component Value Date   CHOL 259 (H) 12/09/2022   HDL 49 12/09/2022   LDLCALC 173 (H) 12/09/2022   TRIG 201 (H) 12/09/2022   CHOLHDL 5.3 (H) 12/09/2022   Lab Results  Component Value Date   NA 141 12/09/2022   K 4.3 12/09/2022   CREATININE 1.04 (H) 12/09/2022   EGFR 66 12/09/2022   GLUCOSE 106 (H) 12/09/2022   TSH 1.640 12/09/2022     The 10-year ASCVD risk score (Arnett DK, et al., 2019) is: 5.2%  ---------------------------------------------------------------------------------------------------    Medications: Outpatient Medications Prior to Visit  Medication Sig   albuterol (PROVENTIL HFA;VENTOLIN HFA) 108 (90 Base) MCG/ACT inhaler Inhale into the lungs every 6 (six) hours as needed for wheezing or shortness of breath. (Patient not taking: Reported on 12/20/2022)   amLODipine (NORVASC) 2.5 MG tablet Take 1 tablet (2.5 mg total) by mouth daily.   cyclobenzaprine (FLEXERIL) 10 MG tablet TAKE 1 TABLET(10 MG) BY MOUTH EVERY NIGHT AT BEDTIME AS NEEDED FOR BACK SPASM (Patient not taking: Reported on 12/20/2022)   estrogens, conjugated, (PREMARIN) 1.25 MG tablet TAKE 1 TABLET(1.25 MG) BY MOUTH DAILY   lisinopril (ZESTRIL) 20 MG tablet TAKE 1 TABLET BY MOUTH AT NIGHT FOR BLOOD PRESSURE (Patient not taking: Reported on 12/20/2022)   lisinopril-hydrochlorothiazide (ZESTORETIC) 20-12.5 MG tablet Take 1 tablet by mouth daily. (Patient not taking: Reported on 12/20/2022)   meloxicam (MOBIC) 15 MG tablet Take 1 tablet (15 mg total) by mouth daily. (Patient not taking: Reported on 12/20/2022)   metoprolol tartrate (LOPRESSOR) 50 MG tablet TAKE 1 TABLET(50 MG) BY MOUTH TWICE DAILY   pravastatin (PRAVACHOL) 40 MG tablet TAKE 1 TABLET(40 MG) BY MOUTH DAILY   solifenacin (VESICARE) 10 MG tablet TAKE 1 TABLET(10 MG) BY MOUTH DAILY   traZODone (DESYREL) 100 MG tablet TAKE 1 TABLET BY MOUTH EVERY NIGHT AT BEDTIME FOR SLEEP   No facility-administered medications prior to visit.    Review of Systems  {Labs  Heme  Chem  Endocrine  Serology  Results Review (optional):23779}   Objective  There were no vitals taken for this visit. {Show previous vital signs (optional):23777}  Physical Exam  ***  No results found for any visits on 01/28/23.  Assessment & Plan     ***  No follow-ups on file.      {provider attestation***:1}   Mardene Speak, PA-C  [place] [phone] (phone) [phone] (fax)  [place]

## 2023-01-29 ENCOUNTER — Ambulatory Visit (HOSPITAL_BASED_OUTPATIENT_CLINIC_OR_DEPARTMENT_OTHER): Payer: 59

## 2023-02-18 ENCOUNTER — Encounter: Payer: Self-pay | Admitting: Physician Assistant

## 2023-02-18 DIAGNOSIS — I10 Essential (primary) hypertension: Secondary | ICD-10-CM

## 2023-02-20 MED ORDER — AMLODIPINE BESYLATE 5 MG PO TABS: 5.0000 mg | ORAL_TABLET | Freq: Every day | ORAL | 1 refills | Status: DC
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
## 2023-03-06 ENCOUNTER — Encounter: Payer: Self-pay | Admitting: Psychiatry

## 2023-03-06 ENCOUNTER — Ambulatory Visit (INDEPENDENT_AMBULATORY_CARE_PROVIDER_SITE_OTHER): Payer: 59 | Admitting: Psychiatry

## 2023-03-06 VITALS — BP 138/83 | HR 61 | Temp 98.2°F | Ht 67.0 in | Wt 158.8 lb

## 2023-03-06 DIAGNOSIS — F4310 Post-traumatic stress disorder, unspecified: Secondary | ICD-10-CM | POA: Diagnosis not present

## 2023-03-06 DIAGNOSIS — F3181 Bipolar II disorder: Secondary | ICD-10-CM | POA: Diagnosis not present

## 2023-03-06 DIAGNOSIS — F1290 Cannabis use, unspecified, uncomplicated: Secondary | ICD-10-CM | POA: Diagnosis not present

## 2023-03-06 MED ORDER — LAMOTRIGINE 25 MG PO TABS: ORAL_TABLET | ORAL | 0 refills | Status: DC

## 2023-03-06 NOTE — Telephone Encounter (Signed)
This encounter was created in error - please disregard.

## 2023-03-06 NOTE — Patient Instructions (Signed)
Start lamotrigine 25 mg daily for 2 weeks, then 50 mg daily Obtain EKG at the next visit  Referral to therapy  Next appointment: 6/24 at 10 am

## 2023-03-17 ENCOUNTER — Other Ambulatory Visit: Payer: Self-pay | Admitting: Physician Assistant

## 2023-03-17 DIAGNOSIS — Z780 Asymptomatic menopausal state: Secondary | ICD-10-CM

## 2023-03-17 DIAGNOSIS — I10 Essential (primary) hypertension: Secondary | ICD-10-CM

## 2023-03-18 MED ORDER — ESTROGENS CONJUGATED 1.25 MG PO TABS: ORAL_TABLET | ORAL | 0 refills | Status: DC

## 2023-03-18 MED ORDER — METOPROLOL TARTRATE 50 MG PO TABS: ORAL_TABLET | ORAL | 0 refills | Status: DC
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
## 2023-04-14 ENCOUNTER — Other Ambulatory Visit: Payer: Self-pay | Admitting: Psychiatry

## 2023-04-14 ENCOUNTER — Ambulatory Visit (INDEPENDENT_AMBULATORY_CARE_PROVIDER_SITE_OTHER): Payer: 59

## 2023-04-14 VITALS — Ht 67.0 in | Wt 158.0 lb

## 2023-04-14 DIAGNOSIS — Z0000 Encounter for general adult medical examination without abnormal findings: Secondary | ICD-10-CM | POA: Diagnosis not present

## 2023-04-14 NOTE — Patient Instructions (Addendum)
Misty Robbins , Thank you for taking time to come for your Medicare Wellness Visit. I appreciate your ongoing commitment to your health goals. Please review the following plan we discussed and let me know if I can assist you in the future.   These are the goals we discussed:  Goals   None     This is a list of the screening recommended for you and due dates:  Health Maintenance  Topic Date Due   COVID-19 Vaccine (1) Never done   Yearly kidney health urinalysis for diabetes  Never done   Hemoglobin A1C  03/25/2021   Zoster (Shingles) Vaccine (1 of 2) Never done   Hepatitis C Screening  12/05/2023*   HIV Screening  12/05/2023*   Flu Shot  05/29/2023   Yearly kidney function blood test for diabetes  12/10/2023   Medicare Annual Wellness Visit  04/13/2024   Mammogram  12/26/2024   Colon Cancer Screening  04/13/2028   HPV Vaccine  Aged Out   DTaP/Tdap/Td vaccine  Discontinued   Complete foot exam   Discontinued   Pap Smear  Discontinued   Eye exam for diabetics  Discontinued  *Topic was postponed. The date shown is not the original due date.    Advanced directives: no  Conditions/risks identified: low falls risl  Next appointment: Follow up in one year for your annual wellness visit. 04/14/2024 @ 11:15am telephone  Preventive Care 40-64 Years, Female Preventive care refers to lifestyle choices and visits with your health care provider that can promote health and wellness. What does preventive care include? A yearly physical exam. This is also called an annual well check. Dental exams once or twice a year. Routine eye exams. Ask your health care provider how often you should have your eyes checked. Personal lifestyle choices, including: Daily care of your teeth and gums. Regular physical activity. Eating a healthy diet. Avoiding tobacco and drug use. Limiting alcohol use. Practicing safe sex. Taking low-dose aspirin daily starting at age 33. Taking vitamin and mineral  supplements as recommended by your health care provider. What happens during an annual well check? The services and screenings done by your health care provider during your annual well check will depend on your age, overall health, lifestyle risk factors, and family history of disease. Counseling  Your health care provider may ask you questions about your: Alcohol use. Tobacco use. Drug use. Emotional well-being. Home and relationship well-being. Sexual activity. Eating habits. Work and work [employer]. Method of birth control. Menstrual cycle. Pregnancy history. Screening  You may have the following tests or measurements: Height, weight, and BMI. Blood pressure. Lipid and cholesterol levels. These may be checked every 5 years, or more frequently if you are over 26 years old. Skin check. Lung cancer screening. You may have this screening every year starting at age 52 if you have a 30-pack-year history of smoking and currently smoke or have quit within the past 15 years. Fecal occult blood test (FOBT) of the stool. You may have this test every year starting at age 44. Flexible sigmoidoscopy or colonoscopy. You may have a sigmoidoscopy every 5 years or a colonoscopy every 10 years starting at age 47. Hepatitis C blood test. Hepatitis B blood test. Sexually transmitted disease (STD) testing. Diabetes screening. This is done by checking your blood sugar (glucose) after you have not eaten for a while (fasting). You may have this done every 1-3 years. Mammogram. This may be done every 1-2 years. Talk to your health care  provider about when you should start having regular mammograms. This may depend on whether you have a family history of breast cancer. BRCA-related cancer screening. This may be done if you have a family history of breast, ovarian, tubal, or peritoneal cancers. Pelvic exam and Pap test. This may be done every 3 years starting at age 53. Starting at age 33, this may be done  every 5 years if you have a Pap test in combination with an HPV test. Bone density scan. This is done to screen for osteoporosis. You may have this scan if you are at high risk for osteoporosis. Discuss your test results, treatment options, and if necessary, the need for more tests with your health care provider. Vaccines  Your health care provider may recommend certain vaccines, such as: Influenza vaccine. This is recommended every year. Tetanus, diphtheria, and acellular pertussis (Tdap, Td) vaccine. You may need a Td booster every 10 years. Zoster vaccine. You may need this after age 69. Pneumococcal 13-valent conjugate (PCV13) vaccine. You may need this if you have certain conditions and were not previously vaccinated. Pneumococcal polysaccharide (PPSV23) vaccine. You may need one or two doses if you smoke cigarettes or if you have certain conditions. Talk to your health care provider about which screenings and vaccines you need and how often you need them. This information is not intended to replace advice given to you by your health care provider. Make sure you discuss any questions you have with your health care provider. Document Released: 11/10/2015 Document Revised: 07/03/2016 Document Reviewed: 08/15/2015 Elsevier Interactive Patient Education  2017 [employer].    Fall Prevention in the Home Falls can cause injuries. They can happen to people of all ages. There are many things you can do to make your home safe and to help prevent falls. What can I do on the outside of my home? Regularly fix the edges of walkways and driveways and fix any cracks. Remove anything that might make you trip as you walk through a door, such as a raised step or threshold. Trim any bushes or trees on the path to your home. Use bright outdoor lighting. Clear any walking paths of anything that might make someone trip, such as rocks or tools. Regularly check to see if handrails are loose or broken. Make  sure that both sides of any steps have handrails. Any raised decks and porches should have guardrails on the edges. Have any leaves, snow, or ice cleared regularly. Use sand or salt on walking paths during winter. Clean up any spills in your garage right away. This includes oil or grease spills. What can I do in the bathroom? Use night lights. Install grab bars by the toilet and in the tub and shower. Do not use towel bars as grab bars. Use non-skid mats or decals in the tub or shower. If you need to sit down in the shower, use a plastic, non-slip stool. Keep the floor dry. Clean up any water that spills on the floor as soon as it happens. Remove soap buildup in the tub or shower regularly. Attach bath mats securely with double-sided non-slip rug tape. Do not have throw rugs and other things on the floor that can make you trip. What can I do in the bedroom? Use night lights. Make sure that you have a light by your bed that is easy to reach. Do not use any sheets or blankets that are too big for your bed. They should not hang down onto the  floor. Have a firm chair that has side arms. You can use this for support while you get dressed. Do not have throw rugs and other things on the floor that can make you trip. What can I do in the kitchen? Clean up any spills right away. Avoid walking on wet floors. Keep items that you use a lot in easy-to-reach places. If you need to reach something above you, use a strong step stool that has a grab bar. Keep electrical cords out of the way. Do not use floor polish or wax that makes floors slippery. If you must use wax, use non-skid floor wax. Do not have throw rugs and other things on the floor that can make you trip. What can I do with my stairs? Do not leave any items on the stairs. Make sure that there are handrails on both sides of the stairs and use them. Fix handrails that are broken or loose. Make sure that handrails are as long as the  stairways. Check any carpeting to make sure that it is firmly attached to the stairs. Fix any carpet that is loose or worn. Avoid having throw rugs at the top or bottom of the stairs. If you do have throw rugs, attach them to the floor with carpet tape. Make sure that you have a light switch at the top of the stairs and the bottom of the stairs. If you do not have them, ask someone to add them for you. What else can I do to help prevent falls? Wear shoes that: Do not have high heels. Have rubber bottoms. Are comfortable and fit you well. Are closed at the toe. Do not wear sandals. If you use a stepladder: Make sure that it is fully opened. Do not climb a closed stepladder. Make sure that both sides of the stepladder are locked into place. Ask someone to hold it for you, if possible. Clearly mark and make sure that you can see: Any grab bars or handrails. First and last steps. Where the edge of each step is. Use tools that help you move around (mobility aids) if they are needed. These include: Canes. Walkers. Scooters. Crutches. Turn on the lights when you go into a dark area. Replace any light bulbs as soon as they burn out. Set up your furniture so you have a clear path. Avoid moving your furniture around. If any of your floors are uneven, fix them. If there are any pets around you, be aware of where they are. Review your medicines with your doctor. Some medicines can make you feel dizzy. This can increase your chance of falling. Ask your doctor what other things that you can do to help prevent falls. This information is not intended to replace advice given to you by your health care provider. Make sure you discuss any questions you have with your health care provider. Document Released: 08/10/2009 Document Revised: 03/21/2016 Document Reviewed: 11/18/2014 Elsevier Interactive Patient Education  2017 [employer].

## 2023-04-14 NOTE — Progress Notes (Signed)
I connected with  Misty Robbins on 04/14/23 by a audio enabled telemedicine application and verified that I am speaking with the correct person using two identifiers.  Patient Location: Home  Provider Location: Office/Clinic  I discussed the limitations of evaluation and management by telemedicine. The patient expressed understanding and agreed to proceed.  Subjective:   Misty Robbins is a 52 y.o. female who presents for Medicare Annual (Subsequent) preventive examination.  Review of Systems    Cardiac Risk Factors include: hypertension;sedentary lifestyle    Objective:    Today's Vitals   04/14/23 1157 04/14/23 1159  Weight: 158 lb (71.7 kg)   Height: 5\' 7"  (1.702 m)   PainSc:  5    Body mass index is 24.75 kg/m.     04/14/2023   12:14 PM 12/03/2022    2:07 PM 05/18/2018   10:57 AM 04/13/2018    7:24 AM  Advanced Directives  Does Patient Have a Medical Advance Directive? No No No No  Would patient like information on creating a medical advance directive?   No - Patient declined No - Patient declined    Current Medications (verified) Outpatient Encounter Medications as of 04/14/2023  Medication Sig   albuterol (PROVENTIL HFA;VENTOLIN HFA) 108 (90 Base) MCG/ACT inhaler Inhale into the lungs every 6 (six) hours as needed for wheezing or shortness of breath.   amLODipine (NORVASC) 5 MG tablet Take 1 tablet (5 mg total) by mouth daily.   cyclobenzaprine (FLEXERIL) 10 MG tablet TAKE 1 TABLET(10 MG) BY MOUTH EVERY NIGHT AT BEDTIME AS NEEDED FOR BACK SPASM   estrogens, conjugated, (PREMARIN) 1.25 MG tablet TAKE 1 TABLET(1.25 MG) BY MOUTH DAILY   lamoTRIgine (LAMICTAL) 25 MG tablet Take 1 tablet (25 mg total) by mouth daily for 14 days, THEN 2 tablets (50 mg total) daily.   metoprolol tartrate (LOPRESSOR) 50 MG tablet TAKE 1 TABLET(50 MG) BY MOUTH TWICE DAILY   pravastatin (PRAVACHOL) 40 MG tablet TAKE 1 TABLET(40 MG) BY MOUTH DAILY   traZODone (DESYREL) 100 MG tablet TAKE 1  TABLET BY MOUTH EVERY NIGHT AT BEDTIME FOR SLEEP   No facility-administered encounter medications on file as of 04/14/2023.    Allergies (verified) Caffeine, Hydrocodone, and Pseudoephedrine   History: Past Medical History:  Diagnosis Date   Allergy    Anxiety    Arthritis    Asthma    Bipolar 1 disorder (HCC)    Chest pain    COPD (chronic obstructive pulmonary disease) (HCC)    Depression    GERD (gastroesophageal reflux disease)    History of kidney stones    Hyperlipidemia    Hypertension    Insomnia    Mitral valve regurgitation    Osteoporosis    Peripheral neuropathy    Tricuspid valve regurgitation    Past Surgical History:  Procedure Laterality Date   ABDOMINAL HYSTERECTOMY  2000   APPENDECTOMY     BACK SURGERY     CERVICAL FUSION  2011,2012   c2-7   COLONOSCOPY WITH PROPOFOL N/A 04/13/2018   Procedure: COLONOSCOPY WITH PROPOFOL;  Surgeon: Midge Minium, MD;  Location: [facility];  Service: Endoscopy;  Laterality: N/A;   ESOPHAGOGASTRODUODENOSCOPY (EGD) WITH PROPOFOL N/A 04/13/2018   Procedure: ESOPHAGOGASTRODUODENOSCOPY (EGD) WITH PROPOFOL;  Surgeon: Midge Minium, MD;  Location: [facility];  Service: Endoscopy;  Laterality: N/A;   LAPAROSCOPY N/A 05/25/2018   Procedure: LAPAROSCOPY DIAGNOSTIC;  Surgeon: Feliberto Gottron Ihor Austin, MD;  Location: [facility];  Service: Gynecology;  Laterality: N/A;  LYSIS OF ADHESION N/A 05/25/2018   Procedure: LYSIS OF ADHESION;  Surgeon: Schermerhorn, Ihor Austin, MD;  Location: [facility];  Service: Gynecology;  Laterality: N/A;   POLYPECTOMY  04/13/2018   Procedure: POLYPECTOMY;  Surgeon: Midge Minium, MD;  Location: [facility];  Service: Endoscopy;;   SKIN GRAFT  1981   from right thigh to bilateral ankles and right knee   SPINE SURGERY     TONSILLECTOMY  2013   TRACHELECTOMY N/A 05/25/2018   Procedure: TRACHELECTOMY;  Surgeon: Schermerhorn, Ihor Austin, MD;  Location: [facility];  Service: Gynecology;   Laterality: N/A;   TUBAL LIGATION     Family History  Problem Relation Age of Onset   Heart disease Mother    Hypertension Mother    Stroke Mother    Alcohol abuse Father    Stroke Father    Cancer Father    Heart disease Father    Diabetes Sister    Diabetes Brother    Breast cancer Paternal Aunt        43's   Suicidality Maternal Grandmother    Social History   Socioeconomic History   Marital status: Married    Spouse name: Not on file   Number of children: 2   Years of education: Not on file   Highest education level: Associate degree: academic program  Occupational History   Not on file  Tobacco Use   Smoking status: Former    Packs/day: 0.50    Years: 25.00    Additional pack years: 0.00    Total pack years: 12.50    Types: Cigarettes    Quit date: 2018    Years since quitting: 6.4   Smokeless tobacco: Never   Tobacco comments:    not since september 30  Vaping Use   Vaping Use: Never used  Substance and Sexual Activity   Alcohol use: Yes    Comment: once a week   Drug use: Yes    Types: Marijuana   Sexual activity: Yes    Birth control/protection: None  Other Topics Concern   Not on file  Social History Narrative   Not on file   Social Determinants of Health   Financial Resource Strain: Low Risk  (04/14/2023)   Overall Financial Resource Strain (CARDIA)    Difficulty of Paying Living Expenses: Not hard at all  Food Insecurity: No Food Insecurity (04/14/2023)   Hunger Vital Sign    Worried About Running Out of Food in the Last Year: Never true    Ran Out of Food in the Last Year: Never true  Transportation Needs: No Transportation Needs (04/14/2023)   PRAPARE - Administrator, Civil Service (Medical): No    Lack of Transportation (Non-Medical): No  Physical Activity: Inactive (04/14/2023)   Exercise Vital Sign    Days of Exercise per Week: 0 days    Minutes of Exercise per Session: 0 min  Stress: No Stress Concern Present (04/14/2023)    [employer] of Occupational Health - Occupational Stress Questionnaire    Feeling of Stress : Not at all  Social Connections: Moderately Isolated (04/14/2023)   Social Connection and Isolation Panel [NHANES]    Frequency of Communication with Friends and Family: More than three times a week    Frequency of Social Gatherings with Friends and Family: Twice a week    Attends Religious Services: Never    Database administrator or Organizations: No    Attends Banker Meetings:  Never    Marital Status: Married    Tobacco Counseling Counseling given: Not Answered Tobacco comments: not since september 30   Clinical Intake:  Pre-visit preparation completed: Yes  Pain : 0-10 Pain Score: 5  Pain Type: Chronic pain Pain Location: Back Pain Orientation: Lower Pain Descriptors / Indicators: Aching Pain Onset: More than a month ago Pain Frequency: Constant Pain Relieving Factors: mobic, aleive, rest  Pain Relieving Factors: mobic, aleive, rest  BMI - recorded: 24.75 Nutritional Status: BMI of 19-24  Normal Nutritional Risks: None Diabetes: No  How often do you need to have someone help you when you read instructions, pamphlets, or other written materials from your doctor or pharmacy?: 1 - Never  Diabetic?no  Interpreter Needed?: No  Comments: lives with husband and son Information entered by :: B.Frederika Hukill,LPM   Activities of Daily Living    04/14/2023   12:15 PM 01/28/2023   10:06 AM  In your present state of health, do you have any difficulty performing the following activities:  Hearing? 1 1  Vision? 0 0  Difficulty concentrating or making decisions? 1 1  Walking or climbing stairs? 1 1  Dressing or bathing? 0 1  Doing errands, shopping? 1 0  Preparing Food and eating ? N   Using the Toilet? N   In the past six months, have you accidently leaked urine? Y   Comment stress incontinence   Do you have problems with loss of bowel control? N   Managing  your Medications? N   Managing your Finances? N   Housekeeping or managing your Housekeeping? Y     Patient Care Team: Debera Lat, Cordelia Poche as PCP - General (Physician Assistant) Lyndon Code, MD (Internal Medicine) Kieth Brightly, MD (General Surgery)  Indicate any recent Medical Services you may have received from other than [facility] providers in the past year (date may be approximate).     Assessment:   This is a routine wellness examination for Misty Robbins.  Hearing/Vision screen Hearing Screening - Comments:: Inadequate hearing with background noices Has had hearing test:declines hearing aides right now Vision Screening - Comments:: Adequate vision with readers [facility] 1yrs ago  Dietary issues and exercise activities discussed: Current Exercise Habits: The patient does not participate in regular exercise at present, Exercise limited by: neurologic condition(s)   Goals Addressed   None    Depression Screen    04/14/2023   12:08 PM 03/06/2023   11:06 AM 01/28/2023   10:05 AM 12/02/2022    4:36 PM 01/04/2022    9:29 AM 10/05/2021    9:07 AM 02/23/2021    9:53 AM  PHQ 2/9 Scores  PHQ - 2 Score 2  0 0  0 0  PHQ- 9 Score 6  8 7      Exception Documentation     Medical reason       Information is confidential and restricted. Go to Review Flowsheets to unlock data.    Fall Risk    04/14/2023   12:04 PM 01/28/2023   10:05 AM 12/02/2022    4:36 PM 01/04/2022    9:29 AM 10/05/2021    9:06 AM  Fall Risk   Falls in the past year? 1 1 1 1 1   Number falls in past yr: 0 0 1 0 1  Injury with Fall? 0 1 0 0 0  Risk for fall due to : No Fall Risks History of fall(s) History of fall(s)    Follow up Education provided;Falls prevention  discussed Falls evaluation completed Falls evaluation completed  Falls evaluation completed    FALL RISK PREVENTION PERTAINING TO THE HOME:  Any stairs in or around the home? Yes  If so, are there any without handrails? Yes  Home free of loose  throw rugs in walkways, pet beds, electrical cords, etc? Yes  Adequate lighting in your home to reduce risk of falls? Yes   ASSISTIVE DEVICES UTILIZED TO PREVENT FALLS:  Life alert? No  Use of a cane, walker or w/c? Yes cane Grab bars in the bathroom? Yes  Shower chair or bench in shower? Yes  Elevated toilet seat or a handicapped toilet? No   Cognitive Function:    10/05/2021    9:14 AM 03/12/2019    9:56 AM  MMSE - Mini Mental State Exam  Orientation to time 5 5  Orientation to Place 5 5  Registration 3 3  Attention/ Calculation 5 5  Recall 3 3  Language- name 2 objects 2 2  Language- repeat 1 1  Language- follow 3 step command 3 3  Language- read & follow direction 1 1  Write a sentence 1 1  Copy design 1 1  Total score 30 30        04/14/2023   12:16 PM  6CIT Screen  What Year? 0 points  What month? 0 points  What time? 0 points  Count back from 20 0 points  Months in reverse 0 points  Repeat phrase 0 points  Total Score 0 points    Immunizations There is no immunization history for the selected administration types on file for this patient.  TDAP status: Up to date  Flu Vaccine status: Declined, Education has been provided regarding the importance of this vaccine but patient still declined. Advised may receive this vaccine at local pharmacy or Health Dept. Aware to provide a copy of the vaccination record if obtained from local pharmacy or Health Dept. Verbalized acceptance and understanding.   Covid-19 vaccine status: Declined, Education has been provided regarding the importance of this vaccine but patient still declined. Advised may receive this vaccine at local pharmacy or Health Dept.or vaccine clinic. Aware to provide a copy of the vaccination record if obtained from local pharmacy or Health Dept. Verbalized acceptance and understanding.  Qualifies for Shingles Vaccine? No    Screening Tests Health Maintenance  Topic Date Due   COVID-19 Vaccine (1)  Never done   Diabetic kidney evaluation - Urine ACR  Never done   HEMOGLOBIN A1C  03/25/2021   Zoster Vaccines- Shingrix (1 of 2) Never done   Hepatitis C Screening  12/05/2023 (Originally 02/07/1989)   HIV Screening  12/05/2023 (Originally 02/07/1986)   INFLUENZA VACCINE  05/29/2023   Diabetic kidney evaluation - eGFR measurement  12/10/2023   Medicare Annual Wellness (AWV)  04/13/2024   MAMMOGRAM  12/26/2024   Colonoscopy  04/13/2028   HPV VACCINES  Aged Out   DTaP/Tdap/Td  Discontinued   FOOT EXAM  Discontinued   PAP SMEAR-Modifier  Discontinued   OPHTHALMOLOGY EXAM  Discontinued    Health Maintenance  Health Maintenance Due  Topic Date Due   COVID-19 Vaccine (1) Never done   Diabetic kidney evaluation - Urine ACR  Never done   HEMOGLOBIN A1C  03/25/2021   Zoster Vaccines- Shingrix (1 of 2) Never done    Colorectal cancer screening: Type of screening: Colonoscopy. Completed yes. Repeat every 10 years  Mammogram status: Completed yes. Repeat every year  Bone Density status: Completed yes.  Results reflect: Bone density results: NORMAL. Repeat every 5 years.  Lung Cancer Screening: (Low Dose CT Chest recommended if Age 50-80 years, 30 pack-year currently smoking OR have quit w/in 15years.) does not qualify.   Lung Cancer Screening Referral: no  Additional Screening:  Hepatitis C Screening: does not qualify; Completed yes  Vision Screening: Recommended annual ophthalmology exams for early detection of glaucoma and other disorders of the eye. Is the patient up to date with their annual eye exam?  No  Who is the provider or what is the name of the office in which the patient attends annual eye exams? [facility] last 3 yrs-will make appt If pt is not established with a provider, would they like to be referred to a provider to establish care? No .   Dental Screening: Recommended annual dental exams for proper oral hygiene  Community Resource Referral / Chronic Care  Management: CRR required this visit?  No   CCM required this visit?  No     Plan:     I have personally reviewed and noted the following in the patient's chart:   Medical and social history Use of alcohol, tobacco or illicit drugs  Current medications and supplements including opioid prescriptions. Patient is not currently taking opioid prescriptions. Functional ability and status Nutritional status Physical activity Advanced directives List of other physicians Hospitalizations, surgeries, and ER visits in previous 12 months Vitals Screenings to include cognitive, depression, and falls Referrals and appointments  In addition, I have reviewed and discussed with patient certain preventive protocols, quality metrics, and best practice recommendations. A written personalized care plan for preventive services as well as general preventive health recommendations were provided to patient.   Sue Lush, LPN   1/61/0960   Nurse Notes: The patient states she is doing alright trying to manage back pain. She states she will be resuming PT in a few days to see if it can help her. Pt has no concerns or questions at this time.

## 2023-04-17 ENCOUNTER — Ambulatory Visit: Payer: Self-pay

## 2023-04-17 NOTE — Patient Outreach (Signed)
  Care Coordination   Initial Visit Note   04/17/2023 Name: Misty Robbins MRN: [id number] DOB: [birth year]  Misty Robbins is a 52 y.o. year old female who sees [place], [place], [place] for primary care. I spoke with  Misty Robbins by phone today.  What matters to the patients health and wellness today?  CM educated patient on [facility] services.  Patient declines services and feels that she is able to manage her medical needs.  Patient agreed to contact her provider in the future if she needs [facility] services.    Goals Addressed   None     SDOH assessments and interventions completed:  No     Care Coordination Interventions:  No, not indicated   Follow up plan: No further intervention required.   Encounter Outcome:  Pt. Refused

## 2023-04-21 ENCOUNTER — Encounter: Payer: Self-pay | Admitting: Psychiatry

## 2023-04-21 ENCOUNTER — Telehealth (INDEPENDENT_AMBULATORY_CARE_PROVIDER_SITE_OTHER): Payer: 59 | Admitting: Psychiatry

## 2023-04-21 DIAGNOSIS — F3181 Bipolar II disorder: Secondary | ICD-10-CM | POA: Diagnosis not present

## 2023-04-21 DIAGNOSIS — F4310 Post-traumatic stress disorder, unspecified: Secondary | ICD-10-CM

## 2023-04-21 MED ORDER — LAMOTRIGINE 100 MG PO TABS: 100.0000 mg | ORAL_TABLET | Freq: Every day | ORAL | 1 refills | Status: DC

## 2023-04-21 NOTE — Patient Instructions (Signed)
Increase lamotrigine 100 mg daily Obtain EKG  please call [phone] to make an appointment  Next appointment:8/12 at 9 30 for 30 mins, IP

## 2023-04-25 ENCOUNTER — Other Ambulatory Visit: Payer: Self-pay | Admitting: Physician Assistant

## 2023-04-25 DIAGNOSIS — I10 Essential (primary) hypertension: Secondary | ICD-10-CM

## 2023-04-28 NOTE — Telephone Encounter (Signed)
Requested Prescriptions  Pending Prescriptions Disp Refills   metoprolol tartrate (LOPRESSOR) 50 MG tablet [Pharmacy Med Name: METOPROLOL TARTRATE 50 MG TAB] 90 tablet 0    Sig: TAKE 1 TABLET BY MOUTH TWICE A DAY     Cardiovascular:  Beta Blockers Passed - 04/25/2023  6:51 PM      Passed - Last BP in normal range    BP Readings from Last 1 Encounters:  01/28/23 (!) 145/93         Passed - Last Heart Rate in normal range    Pulse Readings from Last 1 Encounters:  01/28/23 70         Passed - Valid encounter within last 6 months    Recent Outpatient Visits           3 months ago Elevated cholesterol   [facility] [place], [place], PA-C   4 months ago Elevated cholesterol   [place] [facility] [place], [place], PA-C   4 months ago Essential hypertension   [place] [facility] [place], [place], [place]       Future Appointments             Tomorrow [place], Edmon Crape, PA-C [facility], [facility]

## 2023-04-29 ENCOUNTER — Encounter: Payer: Self-pay | Admitting: Physician Assistant

## 2023-04-29 ENCOUNTER — Ambulatory Visit (INDEPENDENT_AMBULATORY_CARE_PROVIDER_SITE_OTHER): Payer: 59 | Admitting: Physician Assistant

## 2023-04-29 VITALS — BP 134/86 | HR 62 | Temp 98.1°F | Ht 67.0 in | Wt 159.0 lb

## 2023-04-29 DIAGNOSIS — J4520 Mild intermittent asthma, uncomplicated: Secondary | ICD-10-CM | POA: Diagnosis not present

## 2023-04-29 DIAGNOSIS — I10 Essential (primary) hypertension: Secondary | ICD-10-CM | POA: Diagnosis not present

## 2023-04-29 DIAGNOSIS — K219 Gastro-esophageal reflux disease without esophagitis: Secondary | ICD-10-CM

## 2023-04-29 DIAGNOSIS — M5442 Lumbago with sciatica, left side: Secondary | ICD-10-CM | POA: Diagnosis not present

## 2023-04-29 MED ORDER — ALBUTEROL SULFATE HFA 108 (90 BASE) MCG/ACT IN AERS: 2.0000 | INHALATION_SPRAY | RESPIRATORY_TRACT | 3 refills | Status: AC | PRN

## 2023-04-29 NOTE — Progress Notes (Signed)
Established patient visit  Patient: Misty Robbins   DOB: 06-27-1971   52 y.o. Female  MRN: [id number] Visit Date: 04/29/2023  Today's healthcare provider: Debera Lat, PA-C   Chief Complaint  Patient presents with   Medical Management of Chronic Issues   Subjective     HPI   Patient was last seen 3 months ago.  Amlodipine was increased to 5 mg daily. Patient states that she had recently changed pharmacy.  The new pharmacy uses a different manufacturer for the Amlodipine and she noticed her readings were higher with the new then they were with the other.  She has been getting readings of 140-150 over 100-110.  She does admit to taking her blood pressure right before her medication is due.   Last edited by Adline Peals, CMA on 04/29/2023 10:20 AM.       Discussed the use of AI scribe software for clinical note transcription with the patient, who gave verbal consent to proceed.  History of Present Illness   The patient, with a history of hypertension and asthma, presents with concerns about high blood pressure readings in the morning. They have been monitoring their blood pressure at home using a wrist cuff and report readings around 140s over 100 to 110 upon waking. They take amlodipine 5 mcg daily and metoprolol twice daily for blood pressure management.  In addition to hypertension, the patient also has a history of asthma, which is exacerbated by heat and results in shortness of breath and a feeling of thick air. They use Ventolin (albuterol) as needed, particularly when overheated. The frequency of use varies with the seasons, being almost daily in the summer and once every two to three weeks in the winter.  The patient also reports chronic back pain, which is managed with Aleve and occasional use of Mobic. They have had two surgeries on their neck and are experiencing a return of pain and decreased sensation in their arm, suggesting possible nerve compression. They also report a  history of gastritis and acid reflux, which they manage with over-the-counter Flonase and a change in diet.           04/14/2023   12:08 PM 03/06/2023   11:06 AM 01/28/2023   10:05 AM  Depression screen PHQ 2/9  Decreased Interest 1  0  Down, Depressed, Hopeless 1  0  PHQ - 2 Score 2  0  Altered sleeping 1  3  Tired, decreased energy 1  0  Change in appetite 1  0  Feeling bad or failure about yourself  1  0  Trouble concentrating 0  3  Moving slowly or fidgety/restless 0  2  Suicidal thoughts 0  0  PHQ-9 Score 6  8  Difficult doing work/chores Not difficult at all  Somewhat difficult     Information is confidential and restricted. Go to Review Flowsheets to unlock data.      03/06/2023   11:06 AM  GAD 7 : Generalized Anxiety Score  Nervous, Anxious, on Edge   Control/stop worrying   Worry too much - different things   Trouble relaxing   Restless   Easily annoyed or irritable   Afraid - awful might happen   Total GAD 7 Score   Anxiety Difficulty      Information is confidential and restricted. Go to Review Flowsheets to unlock data.    Medications: Outpatient Medications Prior to Visit  Medication Sig   amLODipine (NORVASC) 5 MG tablet Take 1 tablet (  5 mg total) by mouth daily.   cyclobenzaprine (FLEXERIL) 10 MG tablet TAKE 1 TABLET(10 MG) BY MOUTH EVERY NIGHT AT BEDTIME AS NEEDED FOR BACK SPASM   estrogens, conjugated, (PREMARIN) 1.25 MG tablet TAKE 1 TABLET(1.25 MG) BY MOUTH DAILY   lamoTRIgine (LAMICTAL) 100 MG tablet Take 1 tablet (100 mg total) by mouth daily.   metoprolol tartrate (LOPRESSOR) 50 MG tablet TAKE 1 TABLET BY MOUTH TWICE A DAY   pravastatin (PRAVACHOL) 40 MG tablet TAKE 1 TABLET(40 MG) BY MOUTH DAILY   traZODone (DESYREL) 100 MG tablet TAKE 1 TABLET BY MOUTH EVERY NIGHT AT BEDTIME FOR SLEEP   [DISCONTINUED] albuterol (PROVENTIL HFA;VENTOLIN HFA) 108 (90 Base) MCG/ACT inhaler Inhale into the lungs every 6 (six) hours as needed for wheezing or shortness  of breath.   [DISCONTINUED] lamoTRIgine (LAMICTAL) 25 MG tablet Take 1 tablet (25 mg total) by mouth daily for 14 days, THEN 2 tablets (50 mg total) daily.   No facility-administered medications prior to visit.    Review of Systems  All other systems reviewed and are negative.  Except see HPI      Objective    BP 134/86 (BP Location: Right Arm, Patient Position: Sitting, Cuff Size: Normal)   Pulse 62   Temp 98.1 F (36.7 C) (Oral)   Ht 5\' 7"  (1.702 m)   Wt 159 lb (72.1 kg)   SpO2 98%   BMI 24.90 kg/m    Physical Exam Vitals reviewed.  Constitutional:      General: She is not in acute distress.    Appearance: Normal appearance. She is well-developed. She is not diaphoretic.  HENT:     Head: Normocephalic and atraumatic.  Eyes:     General: No scleral icterus.    Conjunctiva/sclera: Conjunctivae normal.  Neck:     Thyroid: No thyromegaly.  Cardiovascular:     Rate and Rhythm: Normal rate and regular rhythm.     Pulses: Normal pulses.     Heart sounds: Normal heart sounds. No murmur heard. Pulmonary:     Effort: Pulmonary effort is normal. No respiratory distress.     Breath sounds: Normal breath sounds. No wheezing, rhonchi or rales.  Musculoskeletal:     Cervical back: Neck supple.     Right lower leg: No edema.     Left lower leg: No edema.  Lymphadenopathy:     Cervical: No cervical adenopathy.  Skin:    General: Skin is warm and dry.     Findings: No rash.  Neurological:     Mental Status: She is alert and oriented to person, place, and time. Mental status is at baseline.  Psychiatric:        Mood and Affect: Mood normal.        Behavior: Behavior normal.      No results found for any visits on 04/29/23.  Assessment & Plan        Hypertension: Chronic, unstable? Morning blood pressure readings consistently in the 140s/100-110. No severe symptoms, occasional mild headache. -Continue Amlodipine daily and Metoprolol BID. -Record blood pressure  readings daily in the morning and evening for two weeks. -Bring blood pressure cuff to next appointment for calibration check. FU in 2 weeks  Asthma: Chronic and seasonal. Exacerbations triggered by heat and seasonal allergies, leading to shortness of breath and cough. Ventolin used as needed, more frequently in the summer. -Continue Ventolin inhaler as needed. -Referral to Allergy/Immunology for further evaluation and possible allergy testing. -Consider use of daily  inhaler during summer months.  Chronic Back Pain: History of two neck surgeries, currently experiencing pain and decreased sensation in arm. Regular use of Aleve for arthritis pain. -Reduce use of NSAIDs due to potential kidney impact. -Consider use of Tylenol, Voltaren, and topical anti-inflammatories for pain management. -Continue regular exercise and stretching. -Consider Orthopedics referral if symptoms worsen.  Gastroesophageal Reflux Disease (GERD): History of gastritis, currently taking Aleve which may exacerbate symptoms. -Consider starting a proton pump inhibitor (PPI) such as Omeprazole to protect against gastritis and acid reflux.  General Health Maintenance: -Order labs today. -Follow-up appointment in two weeks to review blood pressure readings and discuss further management.     Return in about 2 weeks (around 05/13/2023) for BP f/u.     The patient was advised to call back or seek an in-person evaluation if the symptoms worsen or if the condition fails to improve as anticipated.  I discussed the assessment and treatment plan with the patient. The patient was provided an opportunity to ask questions and all were answered. The patient agreed with the plan and demonstrated an understanding of the instructions.  I, Debera Lat, PA-C have reviewed all documentation for this visit. The documentation on  04/29/23  for the exam, diagnosis, procedures, and orders are all accurate and complete.  Debera Lat, [facility],  MMS [facility] [phone] (phone) [phone] (fax)  [facility]

## 2023-04-30 LAB — COMPREHENSIVE METABOLIC PANEL
ALT: 10 IU/L (ref 0–32)
AST: 16 IU/L (ref 0–40)
Albumin: 3.9 g/dL (ref 3.9–4.9)
Alkaline Phosphatase: 117 IU/L (ref 44–121)
BUN/Creatinine Ratio: 13 (ref 9–23)
BUN: 11 mg/dL (ref 6–24)
Bilirubin Total: <0.2 mg/dL (ref 0.0–1.2)
CO2: 23 mmol/L (ref 20–29)
Calcium: 9 mg/dL (ref 8.7–10.2)
Chloride: 102 mmol/L (ref 96–106)
Creatinine, Ser: 0.88 mg/dL (ref 0.57–1.00)
Globulin, Total: 2.9 g/dL (ref 1.5–4.5)
Glucose: 84 mg/dL (ref 70–99)
Potassium: 4.6 mmol/L (ref 3.5–5.2)
Sodium: 138 mmol/L (ref 134–144)
Total Protein: 6.8 g/dL (ref 6.0–8.5)
eGFR: 80 mL/min/{1.73_m2} (ref >59)

## 2023-04-30 LAB — LIPID PANEL
Chol/HDL Ratio: 4.4 ratio (ref 0.0–4.4)
Cholesterol, Total: 243 mg/dL — ABNORMAL HIGH (ref 100–199)
HDL: 55 mg/dL (ref >39)
LDL Chol Calc (NIH): 132 mg/dL — ABNORMAL HIGH (ref 0–99)
Triglycerides: 316 mg/dL — ABNORMAL HIGH (ref 0–149)
VLDL Cholesterol Cal: 56 mg/dL — ABNORMAL HIGH (ref 5–40)

## 2023-05-11 NOTE — Progress Notes (Deleted)
Established patient visit  Patient: Misty Robbins   DOB: 01-Sep-1971   52 y.o. Female  MRN: [id number] Visit Date: 05/13/2023  Today's healthcare provider: Debera Lat, PA-C   No chief complaint on file.  Subjective    HPI  *** Discussed the use of AI scribe software for clinical note transcription with the patient, who gave verbal consent to proceed.  History of Present Illness               04/14/2023   12:08 PM 03/06/2023   11:06 AM 01/28/2023   10:05 AM  Depression screen PHQ 2/9  Decreased Interest 1  0  Down, Depressed, Hopeless 1  0  PHQ - 2 Score 2  0  Altered sleeping 1  3  Tired, decreased energy 1  0  Change in appetite 1  0  Feeling bad or failure about yourself  1  0  Trouble concentrating 0  3  Moving slowly or fidgety/restless 0  2  Suicidal thoughts 0  0  PHQ-9 Score 6  8  Difficult doing work/chores Not difficult at all  Somewhat difficult     Information is confidential and restricted. Go to Review Flowsheets to unlock data.      03/06/2023   11:06 AM  GAD 7 : Generalized Anxiety Score  Nervous, Anxious, on Edge   Control/stop worrying   Worry too much - different things   Trouble relaxing   Restless   Easily annoyed or irritable   Afraid - awful might happen   Total GAD 7 Score   Anxiety Difficulty      Information is confidential and restricted. Go to Review Flowsheets to unlock data.    Medications: Outpatient Medications Prior to Visit  Medication Sig  . albuterol (VENTOLIN HFA) 108 (90 Base) MCG/ACT inhaler Inhale 2 puffs into the lungs every 4 (four) hours as needed for wheezing or shortness of breath.  Marland Kitchen amLODipine (NORVASC) 5 MG tablet Take 1 tablet (5 mg total) by mouth daily.  . cyclobenzaprine (FLEXERIL) 10 MG tablet TAKE 1 TABLET(10 MG) BY MOUTH EVERY NIGHT AT BEDTIME AS NEEDED FOR BACK SPASM  . estrogens, conjugated, (PREMARIN) 1.25 MG tablet TAKE 1 TABLET(1.25 MG) BY MOUTH DAILY  . lamoTRIgine (LAMICTAL) 100 MG tablet Take 1  tablet (100 mg total) by mouth daily.  . metoprolol tartrate (LOPRESSOR) 50 MG tablet TAKE 1 TABLET BY MOUTH TWICE A DAY  . pravastatin (PRAVACHOL) 40 MG tablet TAKE 1 TABLET(40 MG) BY MOUTH DAILY  . traZODone (DESYREL) 100 MG tablet TAKE 1 TABLET BY MOUTH EVERY NIGHT AT BEDTIME FOR SLEEP   No facility-administered medications prior to visit.    Review of Systems  All other systems reviewed and are negative. Except see HPI   {Insert previous labs (optional):23779}  {See past labs  Heme  Chem  Endocrine  Serology  Results Review (optional):1}   Objective    There were no vitals taken for this visit. {Insert last BP/Wt (optional):23777}  {See vitals history (optional):1}  Physical Exam Vitals reviewed.  Constitutional:      General: She is not in acute distress.    Appearance: Normal appearance. She is well-developed. She is not diaphoretic.  HENT:     Head: Normocephalic and atraumatic.  Eyes:     General: No scleral icterus.    Conjunctiva/sclera: Conjunctivae normal.  Neck:     Thyroid: No thyromegaly.  Cardiovascular:     Rate and Rhythm: Normal rate and regular rhythm.  Pulses: Normal pulses.     Heart sounds: Normal heart sounds. No murmur heard. Pulmonary:     Effort: Pulmonary effort is normal. No respiratory distress.     Breath sounds: Normal breath sounds. No wheezing, rhonchi or rales.  Musculoskeletal:     Cervical back: Neck supple.     Right lower leg: No edema.     Left lower leg: No edema.  Lymphadenopathy:     Cervical: No cervical adenopathy.  Skin:    General: Skin is warm and dry.     Findings: No rash.  Neurological:     Mental Status: She is alert and oriented to person, place, and time. Mental status is at baseline.  Psychiatric:        Mood and Affect: Mood normal.        Behavior: Behavior normal.     No results found for any visits on 05/13/23.  Assessment & Plan    *** Assessment and Plan              No follow-ups  on file.      [facility]

## 2023-05-12 ENCOUNTER — Telehealth: Payer: Self-pay

## 2023-05-12 NOTE — Telephone Encounter (Signed)
Copied from CRM [id number]. Topic: Appointment Scheduling - Scheduling Inquiry for Clinic >> May 12, 2023 10:28 AM Phill Myron wrote: Patient has an appt July 16 and would like to know can it be changed to a video visit. Due to her severe pain. Please advise

## 2023-05-12 NOTE — Telephone Encounter (Signed)
Called and spoke w/ patient patient stated that she wasn't able to come in for visit on 05/16/23, she was having issues with her back and wasn't able to get out of bed . I reschedule patient for next 05/19/23.

## 2023-05-13 ENCOUNTER — Ambulatory Visit: Payer: 59 | Admitting: Physician Assistant

## 2023-05-13 DIAGNOSIS — K219 Gastro-esophageal reflux disease without esophagitis: Secondary | ICD-10-CM

## 2023-05-13 DIAGNOSIS — I10 Essential (primary) hypertension: Secondary | ICD-10-CM

## 2023-05-14 ENCOUNTER — Other Ambulatory Visit: Payer: Self-pay | Admitting: Psychiatry

## 2023-05-18 NOTE — Progress Notes (Unsigned)
  Established patient visit  Patient: Misty Robbins   DOB: June 22, 1971   52 y.o. Female  MRN: [id number] Visit Date: 05/19/2023  Today's healthcare provider: Debera Lat, PA-C   No chief complaint on file.  Subjective    HPI  *** Discussed the use of AI scribe software for clinical note transcription with the patient, who gave verbal consent to proceed.  History of Present Illness               04/14/2023   12:08 PM 03/06/2023   11:06 AM 01/28/2023   10:05 AM  Depression screen PHQ 2/9  Decreased Interest 1  0  Down, Depressed, Hopeless 1  0  PHQ - 2 Score 2  0  Altered sleeping 1  3  Tired, decreased energy 1  0  Change in appetite 1  0  Feeling bad or failure about yourself  1  0  Trouble concentrating 0  3  Moving slowly or fidgety/restless 0  2  Suicidal thoughts 0  0  PHQ-9 Score 6  8  Difficult doing work/chores Not difficult at all  Somewhat difficult     Information is confidential and restricted. Go to Review Flowsheets to unlock data.      03/06/2023   11:06 AM  GAD 7 : Generalized Anxiety Score  Nervous, Anxious, on Edge   Control/stop worrying   Worry too much - different things   Trouble relaxing   Restless   Easily annoyed or irritable   Afraid - awful might happen   Total GAD 7 Score   Anxiety Difficulty      Information is confidential and restricted. Go to Review Flowsheets to unlock data.    Medications: Outpatient Medications Prior to Visit  Medication Sig   albuterol (VENTOLIN HFA) 108 (90 Base) MCG/ACT inhaler Inhale 2 puffs into the lungs every 4 (four) hours as needed for wheezing or shortness of breath.   amLODipine (NORVASC) 5 MG tablet Take 1 tablet (5 mg total) by mouth daily.   cyclobenzaprine (FLEXERIL) 10 MG tablet TAKE 1 TABLET(10 MG) BY MOUTH EVERY NIGHT AT BEDTIME AS NEEDED FOR BACK SPASM   estrogens, conjugated, (PREMARIN) 1.25 MG tablet TAKE 1 TABLET(1.25 MG) BY MOUTH DAILY   lamoTRIgine (LAMICTAL) 100 MG tablet Take 1  tablet (100 mg total) by mouth daily.   metoprolol tartrate (LOPRESSOR) 50 MG tablet TAKE 1 TABLET BY MOUTH TWICE A DAY   pravastatin (PRAVACHOL) 40 MG tablet TAKE 1 TABLET(40 MG) BY MOUTH DAILY   traZODone (DESYREL) 100 MG tablet TAKE 1 TABLET BY MOUTH EVERY NIGHT AT BEDTIME FOR SLEEP   No facility-administered medications prior to visit.    Review of Systems Except see HPI   {Insert previous labs (optional):23779}  {See past labs  Heme  Chem  Endocrine  Serology  Results Review (optional):1}   Objective    There were no vitals taken for this visit. {Insert last BP/Wt (optional):23777}  {See vitals history (optional):1}  Physical Exam   No results found for any visits on 05/19/23.  Assessment & Plan    *** Assessment and Plan              No follow-ups on file.      [facility]

## 2023-05-19 ENCOUNTER — Ambulatory Visit (INDEPENDENT_AMBULATORY_CARE_PROVIDER_SITE_OTHER): Payer: 59 | Admitting: Physician Assistant

## 2023-05-19 ENCOUNTER — Encounter: Payer: Self-pay | Admitting: Physician Assistant

## 2023-05-19 VITALS — BP 133/80 | HR 55 | Temp 98.2°F | Ht 67.0 in | Wt 160.0 lb

## 2023-05-19 DIAGNOSIS — K219 Gastro-esophageal reflux disease without esophagitis: Secondary | ICD-10-CM

## 2023-05-19 DIAGNOSIS — I10 Essential (primary) hypertension: Secondary | ICD-10-CM

## 2023-05-19 DIAGNOSIS — M5442 Lumbago with sciatica, left side: Secondary | ICD-10-CM

## 2023-05-19 DIAGNOSIS — J4520 Mild intermittent asthma, uncomplicated: Secondary | ICD-10-CM

## 2023-05-19 MED ORDER — PREDNISONE 20 MG PO TABS
20.0000 mg | ORAL_TABLET | Freq: Every day | ORAL | 0 refills | Status: DC
Start: 2023-05-19 — End: 2023-07-28
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
## 2023-06-09 ENCOUNTER — Ambulatory Visit: Payer: 59 | Admitting: Psychiatry

## 2023-06-14 ENCOUNTER — Other Ambulatory Visit: Payer: Self-pay | Admitting: Psychiatry

## 2023-06-15 ENCOUNTER — Other Ambulatory Visit: Payer: Self-pay | Admitting: Physician Assistant

## 2023-06-15 DIAGNOSIS — I10 Essential (primary) hypertension: Secondary | ICD-10-CM

## 2023-06-28 ENCOUNTER — Other Ambulatory Visit: Payer: Self-pay | Admitting: Physician Assistant

## 2023-06-28 DIAGNOSIS — F5101 Primary insomnia: Secondary | ICD-10-CM

## 2023-07-01 NOTE — Telephone Encounter (Signed)
Requested by interface surescripts. Future visit in 2 months.  Requested Prescriptions  Pending Prescriptions Disp Refills   traZODone (DESYREL) 100 MG tablet [Pharmacy Med Name: TRAZODONE 100 MG TABLET] 90 tablet 0    Sig: TAKE 1 TABLET BY MOUTH EVERY NIGHT AT BEDTIME FOR SLEEP     Psychiatry: Antidepressants - Serotonin Modulator Passed - 06/28/2023  9:39 AM      Passed - Valid encounter within last 6 months    Recent Outpatient Visits           1 month ago Midline low back pain with left-sided sciatica, unspecified chronicity   [place] [facility] [place], [place], PA-C   2 months ago Essential hypertension   [place] [facility] [place], [place], PA-C   5 months ago Elevated cholesterol   [place] [facility] [place], [place], PA-C   6 months ago Elevated cholesterol   [place] [facility] [place], [place], [place]   7 months ago Essential hypertension   [place] [facility] [place], [place], PA-C       Future Appointments             In 2 months [place], Edmon Crape, PA-C [facility] [place], [facility]

## 2023-07-11 ENCOUNTER — Other Ambulatory Visit: Payer: Self-pay | Admitting: Psychiatry

## 2023-07-14 ENCOUNTER — Other Ambulatory Visit: Payer: Self-pay | Admitting: Physician Assistant

## 2023-07-14 DIAGNOSIS — Z780 Asymptomatic menopausal state: Secondary | ICD-10-CM
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
## 2023-07-27 ENCOUNTER — Other Ambulatory Visit: Payer: Self-pay | Admitting: Psychiatry

## 2023-07-27 ENCOUNTER — Other Ambulatory Visit: Payer: Self-pay | Admitting: Physician Assistant

## 2023-07-27 DIAGNOSIS — I10 Essential (primary) hypertension: Secondary | ICD-10-CM

## 2023-07-28 ENCOUNTER — Ambulatory Visit (INDEPENDENT_AMBULATORY_CARE_PROVIDER_SITE_OTHER): Payer: 59 | Admitting: Psychiatry

## 2023-07-28 ENCOUNTER — Encounter: Payer: Self-pay | Admitting: Psychiatry

## 2023-07-28 VITALS — BP 120/82 | HR 63 | Temp 98.7°F | Ht 67.0 in | Wt 156.4 lb

## 2023-07-28 DIAGNOSIS — F3181 Bipolar II disorder: Secondary | ICD-10-CM

## 2023-07-28 DIAGNOSIS — F4310 Post-traumatic stress disorder, unspecified: Secondary | ICD-10-CM | POA: Diagnosis not present

## 2023-07-28 DIAGNOSIS — Z79899 Other long term (current) drug therapy: Secondary | ICD-10-CM | POA: Diagnosis not present

## 2023-07-28 NOTE — Patient Instructions (Signed)
Increase lamotrigine 150 mg daily after obtaining EKG Obtain EKG - please call [phone] or [phone] to make an appointment   Next appointment: 11/19 at 3 PM Consider [facility] for therapy

## 2023-08-15 ENCOUNTER — Encounter: Payer: Self-pay | Admitting: Physician Assistant

## 2023-08-15 ENCOUNTER — Ambulatory Visit (INDEPENDENT_AMBULATORY_CARE_PROVIDER_SITE_OTHER): Payer: 59 | Admitting: Physician Assistant

## 2023-08-15 VITALS — BP 138/86 | HR 59 | Ht 67.0 in | Wt 154.4 lb

## 2023-08-15 DIAGNOSIS — Z9889 Other specified postprocedural states: Secondary | ICD-10-CM

## 2023-08-15 DIAGNOSIS — J329 Chronic sinusitis, unspecified: Secondary | ICD-10-CM

## 2023-08-15 DIAGNOSIS — Z9181 History of falling: Secondary | ICD-10-CM | POA: Diagnosis not present

## 2023-08-15 DIAGNOSIS — M5412 Radiculopathy, cervical region: Secondary | ICD-10-CM

## 2023-08-15 DIAGNOSIS — R0989 Other specified symptoms and signs involving the circulatory and respiratory systems: Secondary | ICD-10-CM

## 2023-08-15 MED ORDER — AZITHROMYCIN 250 MG PO TABS
ORAL_TABLET | ORAL | 0 refills | Status: AC
Start: 2023-08-15 — End: 2023-08-20

## 2023-08-15 MED ORDER — PREDNISONE 20 MG PO TABS
20.0000 mg | ORAL_TABLET | Freq: Two times a day (BID) | ORAL | 0 refills | Status: DC
Start: 2023-08-15 — End: 2024-03-29

## 2023-08-15 NOTE — Progress Notes (Signed)
Established patient visit  Patient: Misty Robbins   DOB: 15-Dec-1970   52 y.o. Female  MRN: [id number] Visit Date: 08/15/2023  Today's healthcare provider: Debera Lat, PA-C   Chief Complaint  Patient presents with   Fall    Patient was opening upright freezer and fell back and hittop of neck at bottom of neck 4 weeks ago today but patient reports pain is not getting any better. Reports she is having pain is radiating down right arm into radial bone down to thumb and left and down to pinky and ring finger.    Subjective      Discussed the use of AI scribe software for clinical note transcription with the patient, who gave verbal consent to proceed.  History of Present Illness   The patient, with a history of cervical surgery in 2011, presents with neck pain and numbness in the fingers. The symptoms started after a fall where the patient landed on the back of the neck. The patient did not lose consciousness during the fall but reports constant pain from the neck to the shoulder, rating it as a 6 on a scale of 1 to 10. The pain is described as dull and squeezing, and is exacerbated by certain movements and positions, such as leaning back against a couch. The patient also reports numbness in the fingers, particularly the thumb, which occasionally experiences a burning sensation.  In addition to the neck pain and numbness, the patient is also experiencing symptoms of a cold that started on Tuesday night. The symptoms include a cough, body aches, and fever. The patient reports that the cough exacerbates the neck pain and causes some incontinence. The patient also has a history of asthma, which is usually well-controlled with Ventolin, but the current cold symptoms seem to have triggered an exacerbation.           07/28/2023    2:47 PM 04/14/2023   12:08 PM 03/06/2023   11:06 AM  Depression screen PHQ 2/9  Decreased Interest 1 1 1   Down, Depressed, Hopeless 0 1 1  PHQ - 2 Score 1 2 2    Altered sleeping  1 3  Tired, decreased energy  1 1  Change in appetite  1 0  Feeling bad or failure about yourself   1 1  Trouble concentrating  0 3  Moving slowly or fidgety/restless  0 1  Suicidal thoughts  0 0  PHQ-9 Score  6 11  Difficult doing work/chores  Not difficult at all Very difficult      07/28/2023    2:47 PM 03/06/2023   11:06 AM  GAD 7 : Generalized Anxiety Score  Nervous, Anxious, on Edge 0 3  Control/stop worrying 1 1  Worry too much - different things 1 1  Trouble relaxing 2 3  Restless 1 2  Easily annoyed or irritable 2 3  Afraid - awful might happen 1 0  Total GAD 7 Score 8 13  Anxiety Difficulty Not difficult at all Very difficult    Medications: Outpatient Medications Prior to Visit  Medication Sig   albuterol (VENTOLIN HFA) 108 (90 Base) MCG/ACT inhaler Inhale 2 puffs into the lungs every 4 (four) hours as needed for wheezing or shortness of breath.   amLODipine (NORVASC) 5 MG tablet Take 1 tablet (5 mg total) by mouth daily.   cyclobenzaprine (FLEXERIL) 10 MG tablet TAKE 1 TABLET(10 MG) BY MOUTH EVERY NIGHT AT BEDTIME AS NEEDED FOR BACK SPASM   estrogens, conjugated, (PREMARIN) 1.25  MG tablet TAKE 1 TABLET (1.25 MG) BY MOUTH DAILY   metoprolol tartrate (LOPRESSOR) 50 MG tablet TAKE 1 TABLET BY MOUTH TWICE A DAY   pravastatin (PRAVACHOL) 40 MG tablet TAKE 1 TABLET(40 MG) BY MOUTH DAILY   traZODone (DESYREL) 100 MG tablet TAKE 1 TABLET BY MOUTH EVERY NIGHT AT BEDTIME FOR SLEEP   lamoTRIgine (LAMICTAL) 100 MG tablet Take 1 tablet (100 mg total) by mouth daily.   No facility-administered medications prior to visit.    Review of Systems  All other systems reviewed and are negative.  Except see HPI       Objective    BP 138/86 (BP Location: Left Arm, Patient Position: Sitting, Cuff Size: Normal)   Pulse (!) 59   Ht 5\' 7"  (1.702 m)   Wt 154 lb 6.4 oz (70 kg)   SpO2 99%   BMI 24.18 kg/m     Physical Exam Vitals reviewed.  Constitutional:       General: She is not in acute distress.    Appearance: Normal appearance. She is well-developed. She is not diaphoretic.  HENT:     Head: Normocephalic and atraumatic.     Right Ear: Ear canal and external ear normal.     Left Ear: Ear canal and external ear normal.     Ears:     Comments: Fluids behind tms    Nose: Congestion and rhinorrhea present.     Mouth/Throat:     Comments: Postnasal drainage Eyes:     General: No scleral icterus.       Right eye: No discharge.        Left eye: No discharge.     Extraocular Movements: Extraocular movements intact.     Conjunctiva/sclera: Conjunctivae normal.     Pupils: Pupils are equal, round, and reactive to light.  Neck:     Thyroid: No thyromegaly.  Cardiovascular:     Rate and Rhythm: Normal rate and regular rhythm.     Pulses: Normal pulses.     Heart sounds: Normal heart sounds. No murmur heard. Pulmonary:     Effort: Pulmonary effort is normal. No respiratory distress.     Breath sounds: Wheezing and rhonchi present. No rales.  Musculoskeletal:     Cervical back: Normal range of motion and neck supple. No rigidity.     Right lower leg: No edema.     Left lower leg: No edema.  Lymphadenopathy:     Cervical: Cervical adenopathy present.  Skin:    General: Skin is warm and dry.     Findings: No rash.  Neurological:     Mental Status: She is alert and oriented to person, place, and time. Mental status is at baseline.  Psychiatric:        Mood and Affect: Mood normal.        Behavior: Behavior normal.      No results found for any visits on 08/15/23.  Assessment & Plan        Cervical Spine Trauma Recent fall with impact on the back of the neck. Constant pain radiating to the shoulder and numbness in fingers. History of cervical fusion (C2-C7) with vertebroctomy at C5. -Order MRI of the cervical spine to assess for any new damage or changes related to the fall. Continue symptomatic treatment with OTC pain meds,  Heat/ice, rest Referral to ortho placed Will hold a referral to PT until MRI results?  Respiratory symptoms Due to possible Asthma Exacerbation Hx of asthma. Recent  onset of cough and wheezing, likely secondary to a cold. Currently using Ventolin as needed. -Continue Ventolin as needed. -Prescribe a course of Azithromycin and Prednisone to manage the current exacerbation.  General Health Maintenance / Followup Plans -Consider referral to physical therapy for neck pain management, pending MRI results. -Schedule follow-up appointment to review MRI results and assess response to treatment for asthma exacerbation.      No follow-ups on file.    The patient was advised to call back or seek an in-person evaluation if the symptoms worsen or if the condition fails to improve as anticipated.  I discussed the assessment and treatment plan with the patient. The patient was provided an opportunity to ask questions and all were answered. The patient agreed with the plan and demonstrated an understanding of the instructions. 08/15/23  for the exam, diagnosis, procedures, and orders are all accurate and complete.  Debera Lat, [facility], MMS [facility] [phone] (phone) [phone] (fax)   [facility]

## 2023-09-01 ENCOUNTER — Other Ambulatory Visit: Payer: Self-pay | Admitting: Physician Assistant

## 2023-09-01 DIAGNOSIS — I10 Essential (primary) hypertension: Secondary | ICD-10-CM

## 2023-09-05 ENCOUNTER — Other Ambulatory Visit: Payer: Self-pay | Admitting: Physician Assistant

## 2023-09-05 DIAGNOSIS — I10 Essential (primary) hypertension: Secondary | ICD-10-CM

## 2023-09-09 ENCOUNTER — Ambulatory Visit
Admission: RE | Admit: 2023-09-09 | Discharge: 2023-09-09 | Disposition: A | Discharge status: Home / Self Care | Payer: 59 | Source: Ambulatory Visit | Attending: Physician Assistant | Admitting: Physician Assistant

## 2023-09-09 DIAGNOSIS — M79601 Pain in right arm: Secondary | ICD-10-CM | POA: Diagnosis not present

## 2023-09-09 DIAGNOSIS — M79602 Pain in left arm: Secondary | ICD-10-CM | POA: Diagnosis not present

## 2023-09-09 DIAGNOSIS — Z9181 History of falling: Secondary | ICD-10-CM | POA: Diagnosis not present

## 2023-09-09 DIAGNOSIS — M79644 Pain in right finger(s): Secondary | ICD-10-CM | POA: Diagnosis not present

## 2023-09-09 DIAGNOSIS — M542 Cervicalgia: Secondary | ICD-10-CM | POA: Diagnosis not present

## 2023-09-09 DIAGNOSIS — M5412 Radiculopathy, cervical region: Secondary | ICD-10-CM | POA: Diagnosis not present

## 2023-09-12 NOTE — Progress Notes (Unsigned)
No show

## 2023-09-16 ENCOUNTER — Ambulatory Visit (INDEPENDENT_AMBULATORY_CARE_PROVIDER_SITE_OTHER): Payer: 59 | Admitting: Psychiatry

## 2023-09-16 DIAGNOSIS — Z91199 Patient's noncompliance with other medical treatment and regimen due to unspecified reason: Secondary | ICD-10-CM

## 2023-09-19 ENCOUNTER — Ambulatory Visit: Payer: 59 | Admitting: Physician Assistant

## 2023-09-26 ENCOUNTER — Other Ambulatory Visit: Payer: Self-pay | Admitting: Physician Assistant

## 2023-09-26 DIAGNOSIS — F5101 Primary insomnia: Secondary | ICD-10-CM

## 2023-10-04 ENCOUNTER — Other Ambulatory Visit: Payer: Self-pay | Admitting: Physician Assistant

## 2023-10-04 DIAGNOSIS — Z780 Asymptomatic menopausal state: Secondary | ICD-10-CM

## 2023-12-02 ENCOUNTER — Other Ambulatory Visit: Payer: Self-pay | Admitting: Physician Assistant

## 2023-12-02 DIAGNOSIS — I10 Essential (primary) hypertension: Secondary | ICD-10-CM

## 2023-12-22 ENCOUNTER — Other Ambulatory Visit: Payer: Self-pay | Admitting: Physician Assistant

## 2023-12-22 DIAGNOSIS — F5101 Primary insomnia: Secondary | ICD-10-CM

## 2023-12-22 DIAGNOSIS — I10 Essential (primary) hypertension: Secondary | ICD-10-CM

## 2023-12-28 ENCOUNTER — Other Ambulatory Visit: Payer: Self-pay | Admitting: Physician Assistant

## 2023-12-28 DIAGNOSIS — Z780 Asymptomatic menopausal state: Secondary | ICD-10-CM

## 2023-12-30 NOTE — Telephone Encounter (Signed)
 Requested Prescriptions  Pending Prescriptions Disp Refills   estrogens, conjugated, (PREMARIN) 1.25 MG tablet [Pharmacy Med Name: PREMARIN 1.25 MG TABLET] 90 tablet 0    Sig: TAKE 1 TABLET (1.25 MG) BY MOUTH DAILY     OB/GYN:  Estrogens Passed - 12/30/2023  8:18 AM      Passed - Mammogram is up-to-date per Health Maintenance      Passed - Last BP in normal range    BP Readings from Last 1 Encounters:  08/15/23 138/86         Passed - Valid encounter within last 12 months    Recent Outpatient Visits           4 months ago Respiratory symptoms   [place] [facility] [place], [place], PA-C   7 months ago Midline low back pain with left-sided sciatica, unspecified chronicity   [place] [facility] [place], [place], PA-C   8 months ago Essential hypertension   [place] [facility] [place], [place], [place]   11 months ago Elevated cholesterol   [place] [facility] [place], [place], [place]   1 year ago Elevated cholesterol   [place] [facility] [place], [place], [place]

## 2024-03-15 ENCOUNTER — Other Ambulatory Visit: Payer: Self-pay | Admitting: Physician Assistant

## 2024-03-15 DIAGNOSIS — F5101 Primary insomnia: Secondary | ICD-10-CM

## 2024-03-27 ENCOUNTER — Other Ambulatory Visit: Payer: Self-pay

## 2024-03-27 ENCOUNTER — Emergency Department
Admission: EM | Admit: 2024-03-27 | Discharge: 2024-03-27 | Disposition: A | Discharge status: Home / Self Care | Attending: Emergency Medicine | Admitting: Emergency Medicine

## 2024-03-27 ENCOUNTER — Emergency Department

## 2024-03-27 DIAGNOSIS — Z87442 Personal history of urinary calculi: Secondary | ICD-10-CM | POA: Diagnosis not present

## 2024-03-27 DIAGNOSIS — R109 Unspecified abdominal pain: Secondary | ICD-10-CM | POA: Diagnosis not present

## 2024-03-27 DIAGNOSIS — J45909 Unspecified asthma, uncomplicated: Secondary | ICD-10-CM | POA: Diagnosis not present

## 2024-03-27 DIAGNOSIS — I10 Essential (primary) hypertension: Secondary | ICD-10-CM | POA: Diagnosis not present

## 2024-03-27 DIAGNOSIS — N200 Calculus of kidney: Secondary | ICD-10-CM | POA: Diagnosis not present

## 2024-03-27 DIAGNOSIS — J449 Chronic obstructive pulmonary disease, unspecified: Secondary | ICD-10-CM | POA: Insufficient documentation

## 2024-03-27 DIAGNOSIS — R1012 Left upper quadrant pain: Secondary | ICD-10-CM | POA: Insufficient documentation

## 2024-03-27 HISTORY — DX: Tubulo-interstitial nephritis, not specified as acute or chronic: N12

## 2024-03-27 LAB — CBC
HCT: 39.7 % (ref 36.0–46.0)
Hemoglobin: 13.3 g/dL (ref 12.0–15.0)
MCH: 30.2 pg (ref 26.0–34.0)
MCHC: 33.5 g/dL (ref 30.0–36.0)
MCV: 90.2 fL (ref 80.0–100.0)
Platelets: 258 10*3/uL (ref 150–400)
RBC: 4.4 MIL/uL (ref 3.87–5.11)
RDW: 12.4 % (ref 11.5–15.5)
WBC: 10.4 10*3/uL (ref 4.0–10.5)
nRBC: 0 % (ref 0.0–0.2)

## 2024-03-27 LAB — BASIC METABOLIC PANEL WITH GFR
Anion gap: 8 (ref 5–15)
BUN: 12 mg/dL (ref 6–20)
CO2: 25 mmol/L (ref 22–32)
Calcium: 9 mg/dL (ref 8.9–10.3)
Chloride: 104 mmol/L (ref 98–111)
Creatinine, Ser: 0.8 mg/dL (ref 0.44–1.00)
GFR, Estimated: >60 mL/min (ref >60)
Glucose, Bld: 88 mg/dL (ref 70–99)
Potassium: 3.8 mmol/L (ref 3.5–5.1)
Sodium: 137 mmol/L (ref 135–145)

## 2024-03-27 LAB — URINALYSIS, ROUTINE W REFLEX MICROSCOPIC
Bilirubin Urine: NEGATIVE
Glucose, UA: NEGATIVE mg/dL
Hgb urine dipstick: NEGATIVE
Ketones, ur: NEGATIVE mg/dL
Leukocytes,Ua: NEGATIVE
Nitrite: NEGATIVE
Protein, ur: NEGATIVE mg/dL
Specific Gravity, Urine: 1.015 (ref 1.005–1.030)
pH: 6 (ref 5.0–8.0)

## 2024-03-27 MED ORDER — ALUMINUM-MAGNESIUM-SIMETHICONE 200-200-20 MG/5ML PO SUSP: 30.0000 mL | Freq: Three times a day (TID) | ORAL | 0 refills | Status: DC

## 2024-03-27 MED ORDER — FAMOTIDINE 20 MG PO TABS: 20.0000 mg | ORAL_TABLET | Freq: Two times a day (BID) | ORAL | 0 refills | Status: DC

## 2024-03-27 MED ORDER — KETOROLAC TROMETHAMINE 15 MG/ML IJ SOLN
15.0000 mg | Freq: Once | INTRAMUSCULAR | Status: AC
  Administered 2024-03-27: 15 mg via INTRAVENOUS
  Filled 2024-03-27: qty 1

## 2024-03-27 MED ORDER — ALUM & MAG HYDROXIDE-SIMETH 200-200-20 MG/5ML PO SUSP
30.0000 mL | Freq: Once | ORAL | Status: AC
  Administered 2024-03-27: 30 mL via ORAL
  Filled 2024-03-27: qty 30

## 2024-03-27 MED ORDER — FAMOTIDINE 20 MG PO TABS
40.0000 mg | ORAL_TABLET | Freq: Once | ORAL | Status: AC
  Administered 2024-03-27: 40 mg via ORAL
  Filled 2024-03-27: qty 2

## 2024-03-27 NOTE — ED Triage Notes (Signed)
 Pt to ED for L flank pain since this morning. Hx kidney infections. Denies hematuria, dysuria.

## 2024-03-27 NOTE — ED Provider Notes (Signed)
 [facility] Provider Note    Event Date/Time   First MD Initiated Contact with Patient 03/27/24 1722     (approximate)   History   Chief Complaint: Flank Pain   HPI  Misty Robbins is a 53 y.o. female with a history of anxiety, bipolar disorder, COPD, kidney stones, pyelonephritis who comes ED complaining of left upper abdomen pain radiating to the left flank that started this morning when waking up at 11:00 AM, gradually worsening throughout the day, associated with nausea.  No vomiting or fever.  No dysuria.        Past Medical History:  Diagnosis Date   Allergy    Anxiety    Arthritis    Asthma    Bipolar 1 disorder (HCC)    Chest pain    COPD (chronic obstructive pulmonary disease) (HCC)    Depression    GERD (gastroesophageal reflux disease)    History of kidney stones    Hyperlipidemia    Hypertension    Insomnia    Mitral valve regurgitation    Osteoporosis    Peripheral neuropathy    Pyelonephritis    Tricuspid valve regurgitation     Current Outpatient Rx   Order #: [id number] Class: Normal   Order #: [id number] Class: Normal   Order #: [id number] Class: Normal   Order #: [id number] Class: Normal   Order #: [id number] Class: Normal   Order #: [id number] Class: Normal   Order #: [id number] Class: Normal   Order #: [id number] Class: Normal   Order #: [id number] Class: Normal   Order #: [id number] Class: Normal   Order #: [id number] Class: Normal    Past Surgical History:  Procedure Laterality Date   ABDOMINAL HYSTERECTOMY  2000   APPENDECTOMY     BACK SURGERY     CERVICAL FUSION  2011,2012   c2-7   COLONOSCOPY WITH PROPOFOL  N/A 04/13/2018   Procedure: COLONOSCOPY WITH PROPOFOL ;  Surgeon: Marnee Sink, MD;  Location: [facility];  Service: Endoscopy;  Laterality: N/A;   ESOPHAGOGASTRODUODENOSCOPY (EGD) WITH PROPOFOL  N/A 04/13/2018   Procedure: ESOPHAGOGASTRODUODENOSCOPY (EGD) WITH PROPOFOL ;  Surgeon: Marnee Sink, MD;  Location:  [facility];  Service: Endoscopy;  Laterality: N/A;   LAPAROSCOPY N/A 05/25/2018   Procedure: LAPAROSCOPY DIAGNOSTIC;  Surgeon: Madelene Schanz Joselyn Nicely, MD;  Location: [facility];  Service: Gynecology;  Laterality: N/A;   LYSIS OF ADHESION N/A 05/25/2018   Procedure: LYSIS OF ADHESION;  Surgeon: Schermerhorn, Joselyn Nicely, MD;  Location: [facility];  Service: Gynecology;  Laterality: N/A;   POLYPECTOMY  04/13/2018   Procedure: POLYPECTOMY;  Surgeon: Marnee Sink, MD;  Location: [facility];  Service: Endoscopy;;   SKIN GRAFT  1981   from right thigh to bilateral ankles and right knee   SPINE SURGERY     TONSILLECTOMY  2013   TRACHELECTOMY N/A 05/25/2018   Procedure: TRACHELECTOMY;  Surgeon: Schermerhorn, Joselyn Nicely, MD;  Location: [facility];  Service: Gynecology;  Laterality: N/A;   TUBAL LIGATION      Physical Exam   Triage Vital Signs: ED Triage Vitals  Encounter Vitals Group     BP 03/27/24 1439 (!) 140/91     Systolic BP Percentile --      Diastolic BP Percentile --      Pulse Rate 03/27/24 1439 (!) 56     Resp 03/27/24 1439 16     Temp 03/27/24 1439 98.2 F (36.8 C)     Temp Source 03/27/24 1439 Oral     SpO2 03/27/24 1439 100 %  Weight 03/27/24 1440 145 lb (65.8 kg)     Height 03/27/24 1440 5\' 7"  (1.702 m)     Head Circumference --      Peak Flow --      Pain Score 03/27/24 1446 7     Pain Loc --      Pain Education --      Exclude from Growth Chart --     Most recent vital signs: Vitals:   03/27/24 1439 03/27/24 1800  BP: (!) 140/91 (!) 154/90  Pulse: (!) 56 65  Resp: 16 16  Temp: 98.2 F (36.8 C) 98 F (36.7 C)  SpO2: 100% 98%    General: Awake, no distress.  CV:  Good peripheral perfusion.  Regular rate and rhythm Resp:  Normal effort.  Clear to auscultation Abd:  No distention.  Tenderness of the left upper quadrant.  There is left CVA tenderness. Other:  Moist oral mucosa   ED Results / Procedures / Treatments   Labs (all labs ordered  are listed, but only abnormal results are displayed) Labs Reviewed  URINALYSIS, ROUTINE W REFLEX MICROSCOPIC - Abnormal; Notable for the following components:      Result Value   Color, Urine YELLOW (*)    APPearance CLEAR (*)    All other components within normal limits  BASIC METABOLIC PANEL WITH GFR  CBC     EKG    RADIOLOGY CT abdomen pelvis interpreted by me, negative for ureterolithiasis.  Radiology report reviewed   PROCEDURES:  Procedures   MEDICATIONS ORDERED IN ED: Medications  ketorolac  (TORADOL ) 15 MG/ML injection 15 mg (15 mg Intravenous Given 03/27/24 1759)  alum & mag hydroxide-simeth (MAALOX/MYLANTA) 200-200-20 MG/5ML suspension 30 mL (30 mLs Oral Given 03/27/24 1800)  famotidine  (PEPCID ) tablet 40 mg (40 mg Oral Given 03/27/24 1800)     IMPRESSION / MDM / ASSESSMENT AND PLAN / ED COURSE  I reviewed the triage vital signs and the nursing notes.  DDx: Gastritis, ureterolithiasis, cystitis, AKI  Patient's presentation is most consistent with acute presentation with potential threat to life or bodily function.  Patient presents with left upper quadrant and left flank pain.  Exam concerning for intra-abdominal versus ureteral pathology.  Serum labs and urinalysis are normal.  Will obtain CT abdomen pelvis to evaluate for obstructing stone.  Trial of antacids and NSAIDs.   ----------------------------------------- 7:08 PM on 03/27/2024 ----------------------------------------- Tolerating oral intake.  Calm and comfortable.  Still having residual pain but not in distress.  Agreeable for outpatient management, follow-up with GI if symptoms do not improve with trial of antacids.      FINAL CLINICAL IMPRESSION(S) / ED DIAGNOSES   Final diagnoses:  Acute LUQ pain     Rx / DC Orders   ED Discharge Orders          Ordered    aluminum-magnesium hydroxide-simethicone  (MAALOX) 200-200-20 MG/5ML SUSP  3 times daily before meals & bedtime        03/27/24  1908    famotidine  (PEPCID ) 20 MG tablet  2 times daily        03/27/24 1908             Note:  This document was prepared using Dragon voice recognition software and may include unintentional dictation errors.   Jacquie Maudlin, MD 03/27/24 Jacobo Masters

## 2024-03-27 NOTE — ED Notes (Signed)
 Called several times not answer.

## 2024-03-29 ENCOUNTER — Ambulatory Visit: Payer: Self-pay | Admitting: Emergency Medicine

## 2024-03-29 ENCOUNTER — Encounter: Payer: Self-pay | Admitting: Family Medicine

## 2024-03-29 ENCOUNTER — Telehealth (INDEPENDENT_AMBULATORY_CARE_PROVIDER_SITE_OTHER): Admitting: Family Medicine

## 2024-03-29 ENCOUNTER — Telehealth: Payer: Self-pay | Admitting: Emergency Medicine

## 2024-03-29 ENCOUNTER — Ambulatory Visit: Payer: Self-pay | Admitting: *Deleted

## 2024-03-29 DIAGNOSIS — R109 Unspecified abdominal pain: Secondary | ICD-10-CM

## 2024-03-29 DIAGNOSIS — N200 Calculus of kidney: Secondary | ICD-10-CM | POA: Diagnosis not present

## 2024-03-29 MED ORDER — TAMSULOSIN HCL 0.4 MG PO CAPS: 0.4000 mg | ORAL_CAPSULE | Freq: Every day | ORAL | 0 refills | Status: DC

## 2024-03-29 MED ORDER — OXYCODONE-ACETAMINOPHEN 2.5-325 MG PO TABS: 1.0000 | ORAL_TABLET | Freq: Four times a day (QID) | ORAL | 0 refills | Status: DC | PRN

## 2024-03-29 MED ORDER — NAPROXEN 500 MG PO TABS
500.0000 mg | ORAL_TABLET | Freq: Two times a day (BID) | ORAL | 0 refills | Status: AC
Start: 2024-03-29 — End: ?

## 2024-03-29 NOTE — Telephone Encounter (Signed)
 Copied from CRM [id number]. Topic: Appointments - Appointment Scheduling >> Mar 29, 2024  9:54 AM Chrystal Crape R wrote: Pt still in pain after hospital visit. Hospital notified her she has kidney stones. Reason for Disposition  MODERATE pain (e.g., interferes with normal activities or awakens from sleep)  Answer Assessment - Initial Assessment Questions 1. LOCATION: "Where does it hurt?" (e.g., left, right)     Seen in ED 03/27/2024 for kidney stone.   I have several small kidney stones.   I'm hurting on my lift side.   They gave me Pepcid  and Mylanta. 2. ONSET: "When did the pain start?"     I woke up Sat. Morning with it hurting.  It got worse and worse.  I was having sharp stabbing pains on Sat.     3. SEVERITY: "How bad is the pain?" (e.g., Scale 1-10; mild, moderate, or severe)   - MILD (1-3): doesn't interfere with normal activities    - MODERATE (4-7): interferes with normal activities or awakens from sleep    - SEVERE (8-10): excruciating pain and patient unable to do normal activities (stays in bed)       Severe pain in my left side. 4. PATTERN: "Does the pain come and go, or is it constant?"      Constant  5. CAUSE: "What do you think is causing the pain?"     Kidney stones 6. OTHER SYMPTOMS:  "Do you have any other symptoms?" (e.g., fever, abdomen pain, vomiting, leg weakness, burning with urination, blood in urine)     No burning or blood in urine 7. PREGNANCY:  "Is there any chance you are pregnant?" "When was your last menstrual period?"     Not asked  Protocols used: Flank Pain-A-AH  Chief Complaint: Seen in ED has kidney stones 03/27/2024.   Still having left sided pain. Symptoms: Left sided pain from kidney stones Frequency: Since Sat. morning Pertinent Negatives: Patient denies blood in urine or burning Disposition: [] ED /[] Urgent Care (no appt availability in office) / [x] Appointment(In office/virtual)/ []  [place] [facility]/ [] Home Care/ [] Refused Recommended Disposition  /[] [place] [facility]/ []  Follow-up with PCP Additional Notes: Appt made with Dr. Athena Bland for today at 3:20.

## 2024-03-29 NOTE — Telephone Encounter (Signed)
 Called patient to inform of ct result and to do follow up with pcp.  Left message.

## 2024-03-29 NOTE — Progress Notes (Signed)
 MyChart Video Visit    Virtual Visit via Video Note   This format is felt to be most appropriate for this patient at this time. Physical exam was limited by quality of the video and audio technology used for the visit.   Patient location: Home in [place] Provider location: [facility]  I discussed the limitations of evaluation and management by telemedicine and the availability of in person appointments. The patient expressed understanding and agreed to proceed.  Patient: Misty Robbins   DOB: 1971-07-07   53 y.o. Female  MRN: [id number] Visit Date: 03/29/2024  Today's healthcare provider: Carlean Charter, DO   No chief complaint on file.  Subjective    HPI  Misty Robbins is a 53 year old female with a history of kidney stones who presents with left-sided abdominal and back pain.  She has been experiencing persistent left-sided abdominal pain since Saturday morning, which has not improved with over-the-counter medications such as Maalox, Pepcid , ibuprofen, Aleve, or Tylenol . She took two doses of Aleve and Tylenol  each (two tablets each dose) without relief. The pain has since radiated down her back.  She has a history of kidney stones, having experienced them once before following a hysterectomy when she was advised to take calcium  supplements. During that episode, she passed numerous small stones described as 'tiny grains of sand.' This current episode is more severe than her previous experience.  She visited the ER where her urine was noted to be clear without indication of infection. She has not previously used Flomax or strained her urine to catch stones. This is only her second episode of kidney stones, and she has increased her fluid intake in response to her symptoms.  No pain or burning during urination, fever, chills, constipation, or diarrhea. She experienced nausea on Saturday when the pain was at its worst but has not had nausea since. She describes the pain  as being located under her rib cage and extending around to her back.  Her current medications include amlodipine , metoprolol , estrogen tablets, albuterol , pravastatin , and trazodone . She occasionally uses meloxicam .   CT renal stone study done in ER showed punctate nonobstructing left renal calculi measuring up to 5 mm.    Medications: Outpatient Medications Prior to Visit  Medication Sig   albuterol  (VENTOLIN  HFA) 108 (90 Base) MCG/ACT inhaler Inhale 2 puffs into the lungs every 4 (four) hours as needed for wheezing or shortness of breath.   amLODipine  (NORVASC ) 5 MG tablet TAKE 1 TABLET (5 MG TOTAL) BY MOUTH DAILY.   estrogens , conjugated, (PREMARIN ) 1.25 MG tablet TAKE 1 TABLET (1.25 MG) BY MOUTH DAILY   metoprolol  tartrate (LOPRESSOR ) 50 MG tablet TAKE 1 TABLET BY MOUTH TWICE A DAY   pravastatin  (PRAVACHOL ) 40 MG tablet TAKE 1 TABLET(40 MG) BY MOUTH DAILY   traZODone  (DESYREL ) 100 MG tablet TAKE 1 TABLET BY MOUTH EVERY NIGHT AT BEDTIME FOR SLEEP   [DISCONTINUED] aluminum-magnesium hydroxide-simethicone  (MAALOX) 200-200-20 MG/5ML SUSP Take 30 mLs by mouth 4 (four) times daily -  before meals and at bedtime.   [DISCONTINUED] cyclobenzaprine  (FLEXERIL ) 10 MG tablet TAKE 1 TABLET(10 MG) BY MOUTH EVERY NIGHT AT BEDTIME AS NEEDED FOR BACK SPASM   [DISCONTINUED] famotidine  (PEPCID ) 20 MG tablet Take 1 tablet (20 mg total) by mouth 2 (two) times daily.   [DISCONTINUED] lamoTRIgine  (LAMICTAL ) 100 MG tablet Take 1 tablet (100 mg total) by mouth daily.   [DISCONTINUED] predniSONE  (DELTASONE ) 20 MG tablet Take 1 tablet (20 mg  total) by mouth 2 (two) times daily with a meal.   No facility-administered medications prior to visit.    Review of Systems  Constitutional:  Negative for chills and fever.  Gastrointestinal:  Positive for abdominal pain (left upper quadrant) and nausea (Saturday; resolved now). Negative for constipation, diarrhea and vomiting.  Genitourinary:  Positive for flank pain  (left). Negative for difficulty urinating, dysuria and urgency.        Objective    There were no vitals taken for this visit.     Physical Exam Constitutional:      General: She is not in acute distress.    Appearance: Normal appearance.  HENT:     Head: Normocephalic.  Pulmonary:     Effort: Pulmonary effort is normal. No respiratory distress.  Neurological:     Mental Status: She is alert and oriented to person, place, and time. Mental status is at baseline.        Assessment & Plan    Nephrolithiasis -     Tamsulosin HCl; Take 1 capsule (0.4 mg total) by mouth daily.  Dispense: 28 capsule; Refill: 0 -     oxyCODONE -Acetaminophen ; Take 1-2 tablets by mouth every 6 (six) hours as needed for pain (breakthrough pain, uncontrolled by naproxen).  Dispense: 20 tablet; Refill: 0 -     Naproxen; Take 1 tablet (500 mg total) by mouth 2 (two) times daily with a meal. NO meloxicam  while taking this.  Dispense: 10 tablet; Refill: 0  Acute left flank pain -     oxyCODONE -Acetaminophen ; Take 1-2 tablets by mouth every 6 (six) hours as needed for pain (breakthrough pain, uncontrolled by naproxen).  Dispense: 20 tablet; Refill: 0 -     Naproxen; Take 1 tablet (500 mg total) by mouth 2 (two) times daily with a meal. NO meloxicam  while taking this.  Dispense: 10 tablet; Refill: 0   Nephrolithiasis; left-sided flank pain Experiencing severe left-sided kidney stone pain unrelieved by multiple analgesics. CT shows punctate nonobstructing stones. Discussed tamsulosin to facilitate stone passage and synergistic effect of Tylenol  with opiates. Addressed trazodone  interaction risk, low due to dose. - Prescribe tamsulosin for up to four weeks. - Continue increased fluid intake. - Prescribe higher dose naproxen, avoid concurrent use of meloxicam . - Prescribed oxycodone /acetaminophen  for breakthrough pain, avoid Norco d/t previous negative reaction/side effect. - Discuss trazodone  interaction and  serotonin syndrome risk; mild/low likelihood given lower-range doses.    Return in about 2 weeks (around 04/12/2024) for Chronic f/u w/PCP.     I discussed the assessment and treatment plan with the patient. The patient was provided an opportunity to ask questions and all were answered. The patient agreed with the plan and demonstrated an understanding of the instructions.   The patient was advised to call back or seek an in-person evaluation if the symptoms worsen or if the condition fails to improve as anticipated.  I provided 14 minutes of virtual-face-to-face time during this encounter.   Carlean Charter, DO [facility] [phone] (phone) [phone] (fax)  [facility]

## 2024-03-29 NOTE — Telephone Encounter (Signed)
 Misty Robbins called me back.  Informed that there is a stone--not obstructing and that she should follow up with pcp.

## 2024-04-14 ENCOUNTER — Ambulatory Visit: Payer: 59

## 2024-04-14 DIAGNOSIS — Z0000 Encounter for general adult medical examination without abnormal findings: Secondary | ICD-10-CM | POA: Diagnosis not present

## 2024-04-14 DIAGNOSIS — Z1231 Encounter for screening mammogram for malignant neoplasm of breast: Secondary | ICD-10-CM

## 2024-04-14 NOTE — Progress Notes (Signed)
 Subjective:   Misty Robbins is a 53 y.o. who presents for a Medicare Wellness preventive visit.  As a reminder, Annual Wellness Visits don't include a physical exam, and some assessments may be limited, especially if this visit is performed virtually. We may recommend an in-person follow-up visit with your provider if needed.  Visit Complete: Virtual I connected with  Harla H Krejci on 04/14/24 by a audio enabled telemedicine application and verified that I am speaking with the correct person using two identifiers.  Patient Location: Home  Provider Location: Home Office  I discussed the limitations of evaluation and management by telemedicine. The patient expressed understanding and agreed to proceed.  Vital Signs: Because this visit was a virtual/telehealth visit, some criteria may be missing or patient reported. Any vitals not documented were not able to be obtained and vitals that have been documented are patient reported.  VideoDeclined- This patient declined Librarian, academic. Therefore the visit was completed with audio only.  Persons Participating in Visit: Patient.  AWV Questionnaire: No: Patient Medicare AWV questionnaire was not completed prior to this visit.  Cardiac Risk Factors include: advanced age (>6men, >80 women);sedentary lifestyle;hypertension;dyslipidemia     Objective:    Today's Vitals   04/14/24 1131  PainSc: 5    There is no height or weight on file to calculate BMI.     04/14/2024   11:37 AM 03/27/2024    2:48 PM 04/14/2023   12:14 PM 12/03/2022    2:07 PM 05/18/2018   10:57 AM 04/13/2018    7:24 AM  Advanced Directives  Does Patient Have a Medical Advance Directive? No No No No No  No   Would patient like information on creating a medical advance directive? No - Patient declined    No - Patient declined  No - Patient declined      Data saved with a previous flowsheet row definition    Current Medications  (verified) Outpatient Encounter Medications as of 04/14/2024  Medication Sig   albuterol  (VENTOLIN  HFA) 108 (90 Base) MCG/ACT inhaler Inhale 2 puffs into the lungs every 4 (four) hours as needed for wheezing or shortness of breath.   amLODipine  (NORVASC ) 5 MG tablet TAKE 1 TABLET (5 MG TOTAL) BY MOUTH DAILY.   estrogens , conjugated, (PREMARIN ) 1.25 MG tablet TAKE 1 TABLET (1.25 MG) BY MOUTH DAILY   meloxicam  (MOBIC ) 15 MG tablet Take 15 mg by mouth daily.   metoprolol  tartrate (LOPRESSOR ) 50 MG tablet TAKE 1 TABLET BY MOUTH TWICE A DAY   pravastatin  (PRAVACHOL ) 40 MG tablet TAKE 1 TABLET(40 MG) BY MOUTH DAILY   tamsulosin  (FLOMAX ) 0.4 MG CAPS capsule Take 1 capsule (0.4 mg total) by mouth daily.   traZODone  (DESYREL ) 100 MG tablet TAKE 1 TABLET BY MOUTH EVERY NIGHT AT BEDTIME FOR SLEEP   oxycodone -acetaminophen  (PERCOCET) 2.5-325 MG tablet Take 1-2 tablets by mouth every 6 (six) hours as needed for pain (breakthrough pain, uncontrolled by naproxen ). (Patient not taking: Reported on 04/14/2024)   [DISCONTINUED] naproxen  (NAPROSYN ) 500 MG tablet Take 1 tablet (500 mg total) by mouth 2 (two) times daily with a meal. NO meloxicam  while taking this. (Patient not taking: Reported on 04/14/2024)   No facility-administered encounter medications on file as of 04/14/2024.    Allergies (verified) Caffeine, Hydrocodone, and Pseudoephedrine   History: Past Medical History:  Diagnosis Date   Allergy    Anxiety    Arthritis    Asthma    Bipolar 1 disorder (HCC)  Chest pain    COPD (chronic obstructive pulmonary disease) (HCC)    Depression    GERD (gastroesophageal reflux disease)    History of kidney stones    Hyperlipidemia    Hypertension    Insomnia    Mitral valve regurgitation    Osteoporosis    Peripheral neuropathy    Pyelonephritis    Tricuspid valve regurgitation    Past Surgical History:  Procedure Laterality Date   ABDOMINAL HYSTERECTOMY  2000   APPENDECTOMY     BACK SURGERY      CERVICAL FUSION  2011,2012   c2-7   COLONOSCOPY WITH PROPOFOL  N/A 04/13/2018   Procedure: COLONOSCOPY WITH PROPOFOL ;  Surgeon: Marnee Sink, MD;  Location: [facility];  Service: Endoscopy;  Laterality: N/A;   ESOPHAGOGASTRODUODENOSCOPY (EGD) WITH PROPOFOL  N/A 04/13/2018   Procedure: ESOPHAGOGASTRODUODENOSCOPY (EGD) WITH PROPOFOL ;  Surgeon: Marnee Sink, MD;  Location: [facility];  Service: Endoscopy;  Laterality: N/A;   LAPAROSCOPY N/A 05/25/2018   Procedure: LAPAROSCOPY DIAGNOSTIC;  Surgeon: Madelene Schanz Joselyn Nicely, MD;  Location: [facility];  Service: Gynecology;  Laterality: N/A;   LYSIS OF ADHESION N/A 05/25/2018   Procedure: LYSIS OF ADHESION;  Surgeon: Schermerhorn, Joselyn Nicely, MD;  Location: [facility];  Service: Gynecology;  Laterality: N/A;   POLYPECTOMY  04/13/2018   Procedure: POLYPECTOMY;  Surgeon: Marnee Sink, MD;  Location: [facility];  Service: Endoscopy;;   SKIN GRAFT  1981   from right thigh to bilateral ankles and right knee   SPINE SURGERY     TONSILLECTOMY  2013   TRACHELECTOMY N/A 05/25/2018   Procedure: TRACHELECTOMY;  Surgeon: Schermerhorn, Joselyn Nicely, MD;  Location: [facility];  Service: Gynecology;  Laterality: N/A;   TUBAL LIGATION     Family History  Problem Relation Age of Onset   Heart disease Mother    Hypertension Mother    Stroke Mother    Alcohol abuse Father    Stroke Father    Cancer Father    Heart disease Father    Diabetes Sister    Diabetes Brother    Breast cancer Paternal Aunt        10's   Suicidality Maternal Grandmother    Social History   Socioeconomic History   Marital status: Married    Spouse name: Not on file   Number of children: 2   Years of education: Not on file   Highest education level: Associate degree: academic program  Occupational History   Not on file  Tobacco Use   Smoking status: Former    Current packs/day: 0.00    Average packs/day: 0.5 packs/day for 25.0 years (12.5 ttl pk-yrs)     Types: Cigarettes    Start date: 48    Quit date: 2018    Years since quitting: 7.4   Smokeless tobacco: Never   Tobacco comments:    not since september 30  Vaping Use   Vaping status: Never Used  Substance and Sexual Activity   Alcohol use: Yes    Comment: once a week   Drug use: Yes    Types: Marijuana   Sexual activity: Yes    Birth control/protection: None  Other Topics Concern   Not on file  Social History Narrative   Not on file   Social Drivers of Health   Financial Resource Strain: Low Risk  (04/14/2024)   Overall Financial Resource Strain (CARDIA)    Difficulty of Paying Living Expenses: Not very hard  Food Insecurity: No Food  Insecurity (04/14/2024)   Hunger Vital Sign    Worried About Running Out of Food in the Last Year: Never true    Ran Out of Food in the Last Year: Never true  Transportation Needs: No Transportation Needs (04/14/2024)   PRAPARE - Administrator, Civil Service (Medical): No    Lack of Transportation (Non-Medical): No  Physical Activity: Inactive (04/14/2024)   Exercise Vital Sign    Days of Exercise per Week: 0 days    Minutes of Exercise per Session: 0 min  Stress: No Stress Concern Present (04/14/2024)   [employer] of Occupational Health - Occupational Stress Questionnaire    Feeling of Stress: Not at all  Social Connections: Moderately Isolated (04/14/2024)   Social Connection and Isolation Panel    Frequency of Communication with Friends and Family: More than three times a week    Frequency of Social Gatherings with Friends and Family: Once a week    Attends Religious Services: Never    Database administrator or Organizations: No    Attends Engineer, structural: Never    Marital Status: Married    Tobacco Counseling Counseling given: Not Answered Tobacco comments: not since september 30    Clinical Intake:  Pre-visit preparation completed: Yes  Pain : 0-10 Pain Score: 5  Pain Type: Chronic  pain Pain Location: Shoulder Pain Orientation: Right Pain Descriptors / Indicators: Aching, Discomfort, Constant Pain Onset: More than a month ago Pain Frequency: Constant     BMI - recorded: 24.1 Nutritional Status: BMI of 19-24  Normal Nutritional Risks: None Diabetes: No  Lab Results  Component Value Date   HGBA1C 5.4 09/25/2020   HGBA1C 5.4 02/17/2018     How often do you need to have someone help you when you read instructions, pamphlets, or other written materials from your doctor or pharmacy?: 1 - Never  Interpreter Needed?: No  Information entered by :: Dellie Fergusson, LPN   Activities of Daily Living    04/14/2024   11:38 AM  In your present state of health, do you have any difficulty performing the following activities:  Hearing? 0  Vision? 0  Difficulty concentrating or making decisions? 1  Comment REMEMBERING  Walking or climbing stairs? 1  Dressing or bathing? 0  Doing errands, shopping? 0  Preparing Food and eating ? N  Using the Toilet? N  In the past six months, have you accidently leaked urine? N  Do you have problems with loss of bowel control? N  Managing your Medications? N  Managing your Finances? N  Housekeeping or managing your Housekeeping? N    Patient Care Team: Ostwalt, Janna, PA-C as PCP - General (Physician Assistant) Khan, Fozia M, MD (Internal Medicine) Sankar, Seeplaputhur G, MD (General Surgery)  I have updated your Care Teams any recent Medical Services you may have received from other providers in the past year.     Assessment:   This is a routine wellness examination for Alix.  Hearing/Vision screen Hearing Screening - Comments:: NO AIDS Vision Screening - Comments:: READERS-    Goals Addressed             This Visit's Progress    DIET - EAT MORE FRUITS AND VEGETABLES         Depression Screen     04/14/2024   11:36 AM 07/28/2023    2:47 PM 04/14/2023   12:08 PM 03/06/2023   11:06 AM 01/28/2023   10:05 AM  12/02/2022  4:36 PM 01/04/2022    9:29 AM  PHQ 2/9 Scores  PHQ - 2 Score 0  2  0 0   PHQ- 9 Score 0  6  8 7    Exception Documentation       Medical reason     Information is confidential and restricted. Go to Review Flowsheets to unlock data.    Fall Risk     04/14/2024   11:38 AM 04/14/2023   12:04 PM 01/28/2023   10:05 AM 12/02/2022    4:36 PM 01/04/2022    9:29 AM  Fall Risk   Falls in the past year? 1 1 1 1 1   Number falls in past yr: 0 0 0 1 0  Injury with Fall? 1 0 1 0 0  Risk for fall due to :  No Fall Risks History of fall(s) History of fall(s)   Follow up Falls evaluation completed;Falls prevention discussed Education provided;Falls prevention discussed Falls evaluation completed Falls evaluation completed     MEDICARE RISK AT HOME:  Medicare Risk at Home Any stairs in or around the home?: Yes If so, are there any without handrails?: No Home free of loose throw rugs in walkways, pet beds, electrical cords, etc?: Yes Adequate lighting in your home to reduce risk of falls?: Yes Life alert?: No Use of a cane, walker or w/c?: Yes (CANE WHEN BACK FLARES PAIN) Grab bars in the bathroom?: No Shower chair or bench in shower?: Yes Elevated toilet seat or a handicapped toilet?: No  TIMED UP AND GO:  Was the test performed?  No  Cognitive Function: 6CIT completed    10/05/2021    9:14 AM 03/12/2019    9:56 AM  MMSE - Mini Mental State Exam  Orientation to time 5 5  Orientation to Place 5 5  Registration 3 3  Attention/ Calculation 5 5  Recall 3 3  Language- name 2 objects 2 2  Language- repeat 1 1  Language- follow 3 step command 3 3  Language- read & follow direction 1 1  Write a sentence 1 1  Copy design 1 1  Total score 30 30        04/14/2024   11:40 AM 04/14/2023   12:16 PM  6CIT Screen  What Year? 0 points 0 points  What month? 0 points 0 points  What time? 0 points 0 points  Count back from 20 0 points 0 points  Months in reverse 0 points 0 points   Repeat phrase 0 points 0 points  Total Score 0 points 0 points    Immunizations There is no immunization history for the selected administration types on file for this patient.  Screening Tests Health Maintenance  Topic Date Due   COVID-19 Vaccine (1) Never done   HIV Screening  Never done   Diabetic kidney evaluation - Urine ACR  Never done   Hepatitis C Screening  Never done   Pneumococcal Vaccine 72-52 Years old (1 of 2 - PCV) Never done   Zoster Vaccines- Shingrix (1 of 2) Never done   Cervical Cancer Screening (HPV/Pap Cotest)  Never done   HEMOGLOBIN A1C  03/25/2021   MAMMOGRAM  12/27/2023   INFLUENZA VACCINE  05/28/2024   Diabetic kidney evaluation - eGFR measurement  03/27/2025   Medicare Annual Wellness (AWV)  04/14/2025   Colonoscopy  04/13/2028   HPV VACCINES  Aged Out   Meningococcal B Vaccine  Aged Out   DTaP/Tdap/Td  Discontinued   FOOT EXAM  Discontinued   OPHTHALMOLOGY EXAM  Discontinued    Health Maintenance  Health Maintenance Due  Topic Date Due   COVID-19 Vaccine (1) Never done   HIV Screening  Never done   Diabetic kidney evaluation - Urine ACR  Never done   Hepatitis C Screening  Never done   Pneumococcal Vaccine 78-59 Years old (1 of 2 - PCV) Never done   Zoster Vaccines- Shingrix (1 of 2) Never done   Cervical Cancer Screening (HPV/Pap Cotest)  Never done   HEMOGLOBIN A1C  03/25/2021   MAMMOGRAM  12/27/2023   Health Maintenance Items Addressed: MAMMOGRAM ORDERED; COLONOSCOPY UP TO DATE; ALL VACCINES DECLINED  Additional Screening:  Vision Screening: Recommended annual ophthalmology exams for early detection of glaucoma and other disorders of the eye. Would you like a referral to an eye doctor? No    Dental Screening: Recommended annual dental exams for proper oral hygiene  Community Resource Referral / Chronic Care Management: CRR required this visit?  No   CCM required this visit?  No   Plan:    I have personally reviewed and  noted the following in the patient's chart:   Medical and social history Use of alcohol, tobacco or illicit drugs  Current medications and supplements including opioid prescriptions. Patient is currently taking opioid prescriptions. Information provided to patient regarding non-opioid alternatives. Patient advised to discuss non-opioid treatment plan with their provider. Functional ability and status Nutritional status Physical activity Advanced directives List of other physicians Hospitalizations, surgeries, and ER visits in previous 12 months Vitals Screenings to include cognitive, depression, and falls Referrals and appointments  In addition, I have reviewed and discussed with patient certain preventive protocols, quality metrics, and best practice recommendations. A written personalized care plan for preventive services as well as general preventive health recommendations were provided to patient.   Pinky Bright, LPN   1/61/0960   After Visit Summary: (MyChart) Due to this being a telephonic visit, the after visit summary with patients personalized plan was offered to patient via MyChart   Notes: MAMMOGRAM ORDERED

## 2024-04-14 NOTE — Patient Instructions (Signed)
 Misty Robbins , Thank you for taking time out of your busy schedule to complete your Annual Wellness Visit with me. I enjoyed our conversation and look forward to speaking with you again next year. I, as well as your care team,  appreciate your ongoing commitment to your health goals. Please review the following plan we discussed and let me know if I can assist you in the future.   REFERRAL SENT FOR MAMMOGRAM  You have an order for:  []   2D Mammogram  [x]   3D Mammogram  []   Bone Density     Please call for appointment:  [facility]  [address] [place] [address] [phone] [facility] [address] [place], [place] [address] [phone] [facility] [address] [place], [place] [address] [phone]   Make sure to wear two-piece clothing.  No lotions, powders, or deodorants the day of the appointment. Make sure to bring picture ID and insurance card.  Bring list of medications you are currently taking including any supplements.   Schedule your [place] screening mammogram through MyChart!   Log into your MyChart account.  Go to 'Visit' (or 'Appointments' if on mobile App) --> Schedule an Appointment  Under 'Select a Reason for Visit' choose the Mammogram Screening option.  Complete the pre-visit questions and select the time and place that best fits your schedule.   Follow up Visits: Next Medicare AWV with our clinical staff: 04/20/25 @ 2:30 PM BY PHONE   Have you seen your provider in the last 6 months (3 months if uncontrolled diabetes)? Yes  Clinician Recommendations:  Aim for 30 minutes of exercise or brisk walking, 6-8 glasses of water , and 5 servings of fruits and vegetables each day. TAKE CARE!      This is a list of the screening recommended for you and due dates:  Health Maintenance  Topic Date Due   COVID-19 Vaccine (1)  Never done   HIV Screening  Never done   Yearly kidney health urinalysis for diabetes  Never done   Hepatitis C Screening  Never done   Pneumococcal Vaccination (1 of 2 - PCV) Never done   Zoster (Shingles) Vaccine (1 of 2) Never done   Pap with HPV screening  Never done   Hemoglobin A1C  03/25/2021   Mammogram  12/27/2023   Flu Shot  05/28/2024   Yearly kidney function blood test for diabetes  03/27/2025   Medicare Annual Wellness Visit  04/14/2025   Colon Cancer Screening  04/13/2028   HPV Vaccine  Aged Out   Meningitis B Vaccine  Aged Out   DTaP/Tdap/Td vaccine  Discontinued   Complete foot exam   Discontinued   Eye exam for diabetics  Discontinued    Advanced directives: (ACP Link)Information on Advanced Care Planning can be found at [place]  Secretary of [facility] Advance Health Care Directives Advance Health Care Directives. [email or web address]  Advance Care Planning is important because it:  [x]  Makes sure you receive the medical care that is consistent with your values, goals, and preferences  [x]  It provides guidance to your family and loved ones and reduces their decisional burden about whether or not they are making the right decisions based on your wishes.  Follow the link provided in your after visit summary or read over the paperwork we have mailed to you to help you started getting your Advance  Directives in place. If you need assistance in completing these, please reach out to us  so that we can help you!

## 2024-04-18 ENCOUNTER — Other Ambulatory Visit: Payer: Self-pay | Admitting: Family Medicine

## 2024-04-18 DIAGNOSIS — N200 Calculus of kidney: Secondary | ICD-10-CM

## 2024-04-19 ENCOUNTER — Ambulatory Visit: Payer: Self-pay

## 2024-04-19 ENCOUNTER — Other Ambulatory Visit: Payer: Self-pay | Admitting: Physician Assistant

## 2024-04-19 DIAGNOSIS — Z780 Asymptomatic menopausal state: Secondary | ICD-10-CM

## 2024-04-19 NOTE — Telephone Encounter (Signed)
 FYI Only or Action Required?: Action required by provider: request for appointment.  Patient was last seen in primary care on 03/29/2024 by Donzella Lauraine SAILOR, DO. Called Nurse Triage reporting Shoulder Injury. Symptoms began several months ago. Interventions attempted: Rest, hydration, or home remedies. Symptoms are: stable.  Triage Disposition: See PCP When Office is Open (Within 3 Days)  Patient/caregiver understands and will follow disposition?: YesCopied from CRM [id number]. Topic: Clinical - Red Word Triage >> Apr 19, 2024  3:32 PM Powell HERO wrote: Red Word that prompted transfer to Nurse Triage: Severe pain, ripping feelings in her left shoulder, wanting to be seen asap, pain was worsened over the last three weeks Reason for Disposition  [1] After 2 weeks AND [2] still painful  Answer Assessment - Initial Assessment Questions 1. MECHANISM: How did the injury happen?     Pt fell in Nov 2. ONSET: When did the injury happen? (Minutes or hours ago)      Nov 3. APPEARANCE of INJURY: What does the injury look like?      Normal  4. SEVERITY: Can you move the shoulder normally?      No, becoming weak 5. SIZE: For cuts, bruises, or swelling, ask: How large is it? (e.g., inches or centimeters;  entire joint)      Na  6. PAIN: Is there pain? If Yes, ask: How bad is the pain?   (e.g., Scale 1-10; or mild, moderate, severe)   - MILD (1-3): doesn't interfere with normal activities   - MODERATE (4-7): interferes with normal activities (e.g., work or school) or awakens from sleep   - SEVERE (8-10): excruciating pain, unable to do any normal activities, unable to move arm at all due to pain     Moderate to severe 7. TETANUS: For any breaks in the skin, ask: When was the last tetanus booster?     Na  8. OTHER SYMPTOMS: Do you have any other symptoms? (e.g., loss of sensation)     No     Pt states as long arm stay by side and not lifted,  no issues. Any time pt raise arm, the pain  takes breath away. Pt stated she now has weakness when using. Please put pt on cancellation list for sooner appt. Pt has appt for 6/30.  RN advised pt to seek care at [facility] if pain becomes intolerable.  Protocols used: Shoulder Injury-A-AH

## 2024-04-21 NOTE — Telephone Encounter (Signed)
 Detailed VM left per DPR Patient added to wait list as requested

## 2024-04-26 ENCOUNTER — Ambulatory Visit (INDEPENDENT_AMBULATORY_CARE_PROVIDER_SITE_OTHER): Admitting: Family Medicine

## 2024-04-26 ENCOUNTER — Encounter: Payer: Self-pay | Admitting: Family Medicine

## 2024-04-26 VITALS — BP 113/73 | HR 60 | Resp 16 | Ht 67.0 in | Wt 137.0 lb

## 2024-04-26 DIAGNOSIS — M542 Cervicalgia: Secondary | ICD-10-CM

## 2024-04-26 DIAGNOSIS — J329 Chronic sinusitis, unspecified: Secondary | ICD-10-CM | POA: Diagnosis not present

## 2024-04-26 DIAGNOSIS — G8929 Other chronic pain: Secondary | ICD-10-CM | POA: Diagnosis not present

## 2024-04-26 DIAGNOSIS — M25512 Pain in left shoulder: Secondary | ICD-10-CM

## 2024-04-26 DIAGNOSIS — M25612 Stiffness of left shoulder, not elsewhere classified: Secondary | ICD-10-CM

## 2024-04-26 MED ORDER — MELOXICAM 15 MG PO TABS: 15.0000 mg | ORAL_TABLET | Freq: Every day | ORAL | 2 refills | Status: DC

## 2024-04-26 MED ORDER — AMOXICILLIN 500 MG PO CAPS: 1000.0000 mg | ORAL_CAPSULE | Freq: Two times a day (BID) | ORAL | 0 refills | Status: AC

## 2024-04-26 NOTE — Progress Notes (Signed)
 Established patient visit   Patient: Misty Robbins   DOB: Jan 20, 1971   53 y.o. Female  MRN: [id number] Visit Date: 04/26/2024  Today's healthcare provider: Nancyann Perry, MD   Chief Complaint  Patient presents with   Shoulder Injury    Left side Frequency: patient fell since November 2024.   Sinus Problem    Frequency: x a month Has tried OTC Zycam   Medication Refill    Meloxicam    Subjective    Discussed the use of AI scribe software for clinical note transcription with the patient, who gave verbal consent to proceed.  History of Present Illness   Misty Robbins is a 53 year old female who presents with left shoulder pain and limited mobility.  She has been experiencing severe pain and limited mobility in her left shoulder since November 2024, following a fall while moving a new upright freezer. The pain initially radiated from her neck down to her arm. Over the past six months, the pain has worsened, with significant loss of mobility, especially when lifting objects or rotating her arm, which causes a popping sensation. An MRI of her neck was previously conducted and reported as normal. She has not yet consulted an orthopedist for her shoulder.  She is currently taking meloxicam  as needed for neck pain, which she attributes to arthritis in her entire spine. Her neck pain worsens with changes in weather, particularly with drops in temperature and changes in barometric pressure, causing difficulty in turning her head, especially to the right.  She reports symptoms of a sinus infection characterized by a slow drip and dark green discharge if not cleared every few hours. She uses Ziagen Extreme in the morning to alleviate nasal congestion and has tried Flonase, which provides temporary relief. She has no known allergies to antibiotics but cannot take medications containing pseudoephedrine due to hallucinations experienced in the past.       Medications: Outpatient  Medications Prior to Visit  Medication Sig   albuterol  (VENTOLIN  HFA) 108 (90 Base) MCG/ACT inhaler Inhale 2 puffs into the lungs every 4 (four) hours as needed for wheezing or shortness of breath.   amLODipine  (NORVASC ) 5 MG tablet TAKE 1 TABLET (5 MG TOTAL) BY MOUTH DAILY.   estrogens , conjugated, (PREMARIN ) 1.25 MG tablet TAKE 1 TABLET (1.25 MG) BY MOUTH DAILY   metoprolol  tartrate (LOPRESSOR ) 50 MG tablet TAKE 1 TABLET BY MOUTH TWICE A DAY   pravastatin  (PRAVACHOL ) 40 MG tablet TAKE 1 TABLET(40 MG) BY MOUTH DAILY   tamsulosin  (FLOMAX ) 0.4 MG CAPS capsule TAKE 1 CAPSULE BY MOUTH EVERY DAY   traZODone  (DESYREL ) 100 MG tablet TAKE 1 TABLET BY MOUTH EVERY NIGHT AT BEDTIME FOR SLEEP   [DISCONTINUED] meloxicam  (MOBIC ) 15 MG tablet Take 15 mg by mouth daily.   oxycodone -acetaminophen  (PERCOCET) 2.5-325 MG tablet Take 1-2 tablets by mouth every 6 (six) hours as needed for pain (breakthrough pain, uncontrolled by naproxen ). (Patient not taking: Reported on 04/14/2024)   No facility-administered medications prior to visit.       Objective    BP 113/73 (BP Location: Left Arm, Patient Position: Sitting, Cuff Size: Normal)   Pulse 60   Resp 16   Ht 5' 7 (1.702 m)   Wt 137 lb (62.1 kg)   SpO2 97%   BMI 21.46 kg/m   Physical Exam   General: Appearance:    Well developed, well nourished female in no acute distress  ENT:  Congestion nasal passages with maxillary and frontal sinus tendreness.   Eyes:    PERRL, conjunctiva/corneas clear, EOM's intact       Lungs:     Clear to auscultation bilaterally, respirations unlabored  Heart:    Normal heart rate. Normal rhythm. No murmurs, rubs, or gallops.    MS:   All extremities are intact.  ROM of left shoulder severely limited to about 30 degree flexion, extension and rotation. +3 rotator cuff muscle strength.   Neurologic:   Awake, alert, oriented x 3. No apparent focal neurological defect.           Assessment & Plan        Left  shoulder pain likely due to torn rotator cuff Suspected torn rotator cuff based on symptoms. MRI needed for definitive diagnosis, pending orthopedic referral due to insurance. - Refer to orthopedic specialist for evaluation and possible MRI. - Consider physical therapy as per orthopedic recommendation. - Discuss potential for corticosteroid injection to improve mobility.  Cervical spine arthritis Arthritis in the spine with neck pain exacerbation. Meloxicam  used for pain management. - Refill meloxicam  for as-needed use for neck pain.  Sinusitis Symptoms consistent with sinus infection. No antibiotic allergies, intolerant to pseudoephedrine. - Prescribe amoxicillin  for sinus infection. - Advise against use of pseudoephedrine-containing medications.    No follow-ups on file.     Nancyann Perry, MD  [facility] [phone] (phone) [phone] (fax)  [facility]

## 2024-04-28 ENCOUNTER — Other Ambulatory Visit: Payer: Self-pay | Admitting: Family Medicine

## 2024-04-28 ENCOUNTER — Ambulatory Visit: Payer: Self-pay

## 2024-04-28 DIAGNOSIS — N898 Other specified noninflammatory disorders of vagina: Secondary | ICD-10-CM

## 2024-04-28 MED ORDER — FLUCONAZOLE 150 MG PO TABS: 150.0000 mg | ORAL_TABLET | Freq: Once | ORAL | 0 refills | Status: AC

## 2024-04-28 NOTE — Telephone Encounter (Signed)
 FYI Only or Action Required?: Action required by provider: want medication for yeast infection called into her pharmacy.  Patient was last seen in primary care on 04/26/2024 by Gasper Nancyann BRAVO, MD. Called Nurse Triage reporting Vaginal Discharge. Symptoms began today. Interventions attempted: Nothing. Symptoms are: unchanged.  Triage Disposition: Home Care  Patient/caregiver understands and will follow disposition?: Yes   Would life medication sent to [facility] on [address].    Copied from CRM [id number]. Topic: Clinical - Medical Advice >> Apr 28, 2024  2:33 PM Yolanda T wrote: Reason for CRM: patient was put on an antibiotic on Monday and now she has the onset of a yeast infection. Please f/u for medicaiton Reason for Disposition  [1] Symptoms of a yeast infection (i.e., itchy, white discharge, not bad smelling) AND [2] feels like prior vaginal yeast infections  Answer Assessment - Initial Assessment Questions 1. SYMPTOM: What's the main symptom you're concerned about? (e.g., pain, itching, dryness)     itching 2. LOCATION: Where is the  itching located? (e.g., inside/outside, left/right)     inside 3. ONSET: When did the  itching  start?     today 4. PAIN: Is there any pain? If Yes, ask: How bad is it? (Scale: 1-10; mild, moderate, severe)   -  MILD (1-3): Doesn't interfere with normal activities.    -  MODERATE (4-7): Interferes with normal activities (e.g., work or school) or awakens from sleep.     -  SEVERE (8-10): Excruciating pain, unable to do any normal activities.     no 5. ITCHING: Is there any itching? If Yes, ask: How bad is it? (Scale: 1-10; mild, moderate, severe)     mild 6. CAUSE: What do you think is causing the discharge? Have you had the same problem before? What happened then?     Yes when takes antibiotics 7. OTHER SYMPTOMS: Do you have any other symptoms? (e.g., fever, itching, vaginal bleeding, pain with urination, injury to genital area, vaginal  foreign body)     no  Protocols used: Vaginal Symptoms-A-AH

## 2024-04-28 NOTE — Telephone Encounter (Signed)
Have sent prescription for diflucan to her pharmacy.  

## 2024-04-29 NOTE — Telephone Encounter (Signed)
 Patient picked up medication

## 2024-05-10 DIAGNOSIS — M25512 Pain in left shoulder: Secondary | ICD-10-CM | POA: Diagnosis not present

## 2024-05-12 DIAGNOSIS — M25512 Pain in left shoulder: Secondary | ICD-10-CM | POA: Diagnosis not present

## 2024-05-21 DIAGNOSIS — M7542 Impingement syndrome of left shoulder: Secondary | ICD-10-CM | POA: Diagnosis not present

## 2024-05-24 ENCOUNTER — Ambulatory Visit
Admission: EM | Admit: 2024-05-24 | Discharge: 2024-05-24 | Disposition: A | Discharge status: Home / Self Care | Attending: Emergency Medicine | Admitting: Emergency Medicine

## 2024-05-24 DIAGNOSIS — H66001 Acute suppurative otitis media without spontaneous rupture of ear drum, right ear: Secondary | ICD-10-CM

## 2024-05-24 DIAGNOSIS — J0100 Acute maxillary sinusitis, unspecified: Secondary | ICD-10-CM | POA: Diagnosis not present

## 2024-05-24 DIAGNOSIS — K121 Other forms of stomatitis: Secondary | ICD-10-CM | POA: Diagnosis not present

## 2024-05-24 MED ORDER — IBUPROFEN 600 MG PO TABS: 600.0000 mg | ORAL_TABLET | Freq: Three times a day (TID) | ORAL | 0 refills | Status: DC | PRN

## 2024-05-24 MED ORDER — FLUTICASONE PROPIONATE 50 MCG/ACT NA SUSP: 2.0000 | Freq: Every day | NASAL | 0 refills | Status: DC

## 2024-05-24 MED ORDER — AMOXICILLIN-POT CLAVULANATE 875-125 MG PO TABS: 1.0000 | ORAL_TABLET | Freq: Two times a day (BID) | ORAL | 0 refills | Status: DC

## 2024-05-24 NOTE — ED Triage Notes (Signed)
 Sx x 3 days  Patient states that she has green mucus coming from her nose when she blows it. Patient states that the right side of her face ear and neck are sore. Patient states that she feels very tired. Patient states that she's has been using zycam nose spray and neti pot.

## 2024-05-24 NOTE — Discharge Instructions (Signed)
 Start Mucinex to keep the mucous thin and to decongest you.   You may take 600 mg of motrin  with 1000 mg of tylenol  up to 3-4 times a day as needed for pain. This is an effective combination for pain.  Use a NeilMed sinus rinse with distilled water  as often as you want to to reduce nasal congestion. Follow the directions on the box.  Stop the Zicam extreme oxymetazoline nasal spray.  You cannot use this for more than 3 days.  Flonase  will help with nasal mucosal swelling and congestion.  Finish the Augmentin , even if you feel better.  Salt water  rinses can help the ulcer on your hard palate heal faster.  Go to www.goodrx.com to look up your medications. This will give you a list of where you can find your prescriptions at the most affordable prices. Or you can ask the pharmacist what the cash price is. This is frequently cheaper than going through insurance.

## 2024-05-24 NOTE — ED Provider Notes (Signed)
 HPI  SUBJECTIVE:  Misty Robbins is a 53 y.o. female who presents with 3 days of nasal congestion, green rhinorrhea, right sided sinus pain and pressure, right ear pain, decreased hearing, right neck soreness.  States that she hears bubbles when she tilts her head.  No fevers, facial swelling, upper dental pain, but she notes an ulcer on her right upper hard palate present for the past week.  No antibiotics in the past month.  No antipyretic in the past 6 hours.  She has been using a Nettie pot, oxymetazoline nasal spray, Chloraseptic throat spray without improvement in her symptoms.  Her symptoms are worse with bending forward and lying on her right side.  She has a past medical history of asthma/COPD, hyperlipidemia, hypertension, mitral valve prolapse/tricuspid valve prolapse.  PCP: [facility]  Past Medical History:  Diagnosis Date   Allergy    Anxiety    Arthritis    Asthma    Bipolar 1 disorder (HCC)    Chest pain    COPD (chronic obstructive pulmonary disease) (HCC)    Depression    GERD (gastroesophageal reflux disease)    History of kidney stones    Hyperlipidemia    Hypertension    Insomnia    Mitral valve regurgitation    Osteoporosis    Peripheral neuropathy    Pyelonephritis    Tricuspid valve regurgitation     Past Surgical History:  Procedure Laterality Date   ABDOMINAL HYSTERECTOMY  2000   APPENDECTOMY     BACK SURGERY     CERVICAL FUSION  2011,2012   c2-7   COLONOSCOPY WITH PROPOFOL  N/A 04/13/2018   Procedure: COLONOSCOPY WITH PROPOFOL ;  Surgeon: Jinny Carmine, MD;  Location: [facility];  Service: Endoscopy;  Laterality: N/A;   ESOPHAGOGASTRODUODENOSCOPY (EGD) WITH PROPOFOL  N/A 04/13/2018   Procedure: ESOPHAGOGASTRODUODENOSCOPY (EGD) WITH PROPOFOL ;  Surgeon: Jinny Carmine, MD;  Location: [facility];  Service: Endoscopy;  Laterality: N/A;   LAPAROSCOPY N/A 05/25/2018   Procedure: LAPAROSCOPY DIAGNOSTIC;  Surgeon: Lovetta  Debby PARAS, MD;  Location: [facility];  Service: Gynecology;  Laterality: N/A;   LYSIS OF ADHESION N/A 05/25/2018   Procedure: LYSIS OF ADHESION;  Surgeon: Schermerhorn, Debby PARAS, MD;  Location: [facility];  Service: Gynecology;  Laterality: N/A;   POLYPECTOMY  04/13/2018   Procedure: POLYPECTOMY;  Surgeon: Jinny Carmine, MD;  Location: [facility];  Service: Endoscopy;;   SKIN GRAFT  1981   from right thigh to bilateral ankles and right knee   SPINE SURGERY     TONSILLECTOMY  2013   TRACHELECTOMY N/A 05/25/2018   Procedure: TRACHELECTOMY;  Surgeon: Schermerhorn, Debby PARAS, MD;  Location: [facility];  Service: Gynecology;  Laterality: N/A;   TUBAL LIGATION      Family History  Problem Relation Age of Onset   Heart disease Mother    Hypertension Mother    Stroke Mother    Alcohol abuse Father    Stroke Father    Cancer Father    Heart disease Father    Diabetes Sister    Diabetes Brother    Breast cancer Paternal Aunt        45's   Suicidality Maternal Grandmother     Social History   Tobacco Use   Smoking status: Former    Current packs/day: 0.00    Average packs/day: 0.5 packs/day for 25.0 years (12.5 ttl pk-yrs)    Types: Cigarettes    Start date: 82    Quit date:  2018    Years since quitting: 7.5   Smokeless tobacco: Never   Tobacco comments:    not since september 30  Vaping Use   Vaping status: Never Used  Substance Use Topics   Alcohol use: Yes    Comment: once a week   Drug use: Yes    Types: Marijuana    No current facility-administered medications for this encounter.  Current Outpatient Medications:    amLODipine  (NORVASC ) 5 MG tablet, TAKE 1 TABLET (5 MG TOTAL) BY MOUTH DAILY., Disp: 90 tablet, Rfl: 1   amoxicillin -clavulanate (AUGMENTIN ) 875-125 MG tablet, Take 1 tablet by mouth every 12 (twelve) hours., Disp: 14 tablet, Rfl: 0   estrogens , conjugated, (PREMARIN ) 1.25 MG tablet, TAKE 1 TABLET (1.25 MG) BY MOUTH DAILY, Disp: 30 tablet, Rfl: 2    fluticasone  (FLONASE ) 50 MCG/ACT nasal spray, Place 2 sprays into both nostrils daily., Disp: 16 g, Rfl: 0   ibuprofen  (ADVIL ) 600 MG tablet, Take 1 tablet (600 mg total) by mouth every 8 (eight) hours as needed., Disp: 30 tablet, Rfl: 0   metoprolol  tartrate (LOPRESSOR ) 50 MG tablet, TAKE 1 TABLET BY MOUTH TWICE A DAY, Disp: 180 tablet, Rfl: 0   pravastatin  (PRAVACHOL ) 40 MG tablet, TAKE 1 TABLET(40 MG) BY MOUTH DAILY, Disp: 90 tablet, Rfl: 1   traZODone  (DESYREL ) 100 MG tablet, TAKE 1 TABLET BY MOUTH EVERY NIGHT AT BEDTIME FOR SLEEP, Disp: 90 tablet, Rfl: 0   albuterol  (VENTOLIN  HFA) 108 (90 Base) MCG/ACT inhaler, Inhale 2 puffs into the lungs every 4 (four) hours as needed for wheezing or shortness of breath., Disp: 3 each, Rfl: 3   [Paused] meloxicam  (MOBIC ) 15 MG tablet, Take 1 tablet (15 mg total) by mouth daily., Disp: 30 tablet, Rfl: 2   tamsulosin  (FLOMAX ) 0.4 MG CAPS capsule, TAKE 1 CAPSULE BY MOUTH EVERY DAY, Disp: 90 capsule, Rfl: 0  Allergies  Allergen Reactions   Pseudoephedrine Other (See Comments)    HALLUCINATIONS   Caffeine     exaggerated stimulant effect   Hydrocodone Nausea And Vomiting    PROJECTILE VOMITING     ROS  As noted in HPI.   Physical Exam  BP 131/85 (BP Location: Left Arm)   Pulse (!) 59   Temp 98.7 F (37.1 C) (Oral)   Resp 18   SpO2 99%   Constitutional: Well developed, well nourished, no acute distress Eyes: PERRL, EOMI, conjunctiva normal bilaterally HENT: Normocephalic, atraumatic,mucus membranes moist.  Decreased hearing right ear compared to the left.  Right external ear, EAC normal.  Right TM dull, erythematous with a purulent effusion.  Purulent nasal congestion right side.  Erythematous, swollen turbinates.  Right maxillary sinus tenderness.  Ulcer on right upper hard palate Neck: Shotty right-sided cervical lymphadenopathy Respiratory: Clear to auscultation bilaterally, no rales, no wheezing, no rhonchi Cardiovascular: Normal rate  and rhythm, no murmurs, no gallops, no rubs GI: nondistended skin: No rash, skin intact Musculoskeletal: no deformities Neurologic: Alert & oriented x 3, CN III-XII grossly intact, no motor deficits, sensation grossly intact Psychiatric: Speech and behavior appropriate   ED Course   Medications - No data to display  No orders of the defined types were placed in this encounter.  No results found for this or any previous visit (from the past 24 hours). No results found.  ED Clinical Impression  1. Acute non-recurrent maxillary sinusitis   2. Non-recurrent acute suppurative otitis media of right ear without spontaneous rupture of tympanic membrane   3. Ulcer of  hard palate      ED Assessment/Plan     Patient presents with a right-sided otitis media and acute sinusitis.  Will treat both with Augmentin  875 mg p.o. twice daily for 7 days, Flonase , Mucinex, saline nasal irrigation, Tylenol /ibuprofen , salt water  rinses for the ulcer in her mouth.  Follow-up with PCP as needed.  Discussed MDM, treatment plan, and plan for follow-up with patient . patient agrees with plan.   Meds ordered this encounter  Medications   fluticasone  (FLONASE ) 50 MCG/ACT nasal spray    Sig: Place 2 sprays into both nostrils daily.    Dispense:  16 g    Refill:  0   ibuprofen  (ADVIL ) 600 MG tablet    Sig: Take 1 tablet (600 mg total) by mouth every 8 (eight) hours as needed.    Dispense:  30 tablet    Refill:  0   amoxicillin -clavulanate (AUGMENTIN ) 875-125 MG tablet    Sig: Take 1 tablet by mouth every 12 (twelve) hours.    Dispense:  14 tablet    Refill:  0      *This clinic note was created using Scientist, clinical (histocompatibility and immunogenetics). Therefore, there may be occasional mistakes despite careful proofreading. ?    Van Knee, MD 05/24/24 1506

## 2024-05-28 ENCOUNTER — Telehealth: Payer: Self-pay

## 2024-05-28 NOTE — Telephone Encounter (Signed)
 Copied from CRM [id number]. Topic: Appointments - Scheduling Inquiry for Clinic >> May 28, 2024 10:54 AM Misty Robbins wrote: Reason for CRM: Patient was seen at urgent care for a sinus/ear infection - states ear infection has only gotten worse and she is needing an appointment asap. Has fluid in her ear, wants to know if she can be worked in, I advised I don't have any appointments available till next week. # [phone]

## 2024-05-29 ENCOUNTER — Ambulatory Visit
Admission: EM | Admit: 2024-05-29 | Discharge: 2024-05-29 | Disposition: A | Discharge status: Home / Self Care | Attending: Emergency Medicine | Admitting: Emergency Medicine

## 2024-05-29 ENCOUNTER — Encounter: Payer: Self-pay | Admitting: Emergency Medicine

## 2024-05-29 DIAGNOSIS — H6501 Acute serous otitis media, right ear: Secondary | ICD-10-CM | POA: Diagnosis not present

## 2024-05-29 DIAGNOSIS — J0100 Acute maxillary sinusitis, unspecified: Secondary | ICD-10-CM

## 2024-05-29 MED ORDER — PREDNISONE 10 MG (21) PO TBPK: ORAL_TABLET | Freq: Every day | ORAL | 0 refills | Status: DC

## 2024-05-29 MED ORDER — KETOROLAC TROMETHAMINE 30 MG/ML IJ SOLN
30.0000 mg | Freq: Once | INTRAMUSCULAR | Status: AC
  Administered 2024-05-29: 30 mg via INTRAMUSCULAR

## 2024-05-29 MED ORDER — AZITHROMYCIN 250 MG PO TABS: 250.0000 mg | ORAL_TABLET | Freq: Every day | ORAL | 0 refills | Status: DC

## 2024-05-29 NOTE — ED Triage Notes (Signed)
 Patient was seen on 05/24/24 for sinus infection. Patient now reports that her symptoms has increased. Patient reports facial pain, bilateral ear pain and dizziness. Patient continues to take antibiotics, muicnex, flonase  and Motrin  with no improvement.  Rates pain 7/10.

## 2024-05-29 NOTE — Discharge Instructions (Signed)
 You are being treated for sinus infection and ear infection  Continue Augmentin  as directed  Begin azithromycin  as directed for additional bacterial coverage  You have been given an injection of Toradol  to help reduce inflammation and help with pain and ideally will see improvement within an hour  Starting tomorrow take prednisone  every morning with food to further reduce sinus pressure, take as directed, avoid use of ibuprofen  during treatment but may use Tylenol     For cough: honey 1/2 to 1 teaspoon (you can dilute the honey in water  or another fluid).  You can also use guaifenesin and dextromethorphan for cough. You can use a humidifier for chest congestion and cough.  If you don't have a humidifier, you can sit in the bathroom with the hot shower running.      For sore throat: try warm salt water  gargles, cepacol lozenges, throat spray, warm tea or water  with lemon/honey, popsicles or ice, or OTC cold relief medicine for throat discomfort.   For congestion: take a daily anti-histamine like Zyrtec , Claritin , and a oral decongestant, such as pseudoephedrine.  You can also use Flonase  1-2 sprays in each nostril daily.   It is important to stay hydrated: drink plenty of fluids (water , gatorade/powerade/pedialyte, juices, or teas) to keep your throat moisturized and help further relieve irritation/discomfort.

## 2024-05-29 NOTE — ED Provider Notes (Signed)
 Misty Robbins    CSN: [id number] Arrival date & time: 05/29/24  1337      History   Chief Complaint Chief Complaint  Patient presents with   Otalgia   Facial Pain    HPI Misty Robbins is a 53 y.o. female.   Patient presents for evaluation of persisting and worsening right-sided ear pain, sinus pressure to the right side of the face, soreness to the neck and dizziness beginning 8 days ago.  Has begun to experience pain to the left side which is new in the gums to the right side of the teeth intermittently for unknown period tolerating food and liquids.  No known sick contacts prior.  Was evaluated 5 days ago in urgent care, diagnosed with sinus infection and right-sided ear infection, prescribed Augmentin , has been taking as directed without improvement.  Additionally has been taking Mucinex, Flonase  and Motrin  without relief.  Past Medical History:  Diagnosis Date   Allergy    Anxiety    Arthritis    Asthma    Bipolar 1 disorder (HCC)    Chest pain    COPD (chronic obstructive pulmonary disease) (HCC)    Depression    GERD (gastroesophageal reflux disease)    History of kidney stones    Hyperlipidemia    Hypertension    Insomnia    Mitral valve regurgitation    Osteoporosis    Peripheral neuropathy    Pyelonephritis    Tricuspid valve regurgitation     Patient Active Problem List   Diagnosis Date Noted   Primary insomnia 12/04/2022   Elevated cholesterol 12/04/2022   Menopause 12/04/2022   Bipolar and related disorder (HCC) 12/04/2022   Midline low back pain with left-sided sciatica 12/04/2022   Postsurgical menopause 12/04/2022   Primary osteoarthritis involving multiple joints 11/23/2019   Acute midline low back pain without sciatica 04/28/2019   Dysuria 03/29/2019   Cervical motion tenderness 01/15/2019   Essential hypertension 01/15/2019   Mitral valve disorder 01/15/2019   Gastroesophageal reflux disease without esophagitis 01/15/2019    Encounter for general adult medical examination with abnormal findings 01/15/2019   Postoperative state 05/25/2018   Diarrhea    Benign neoplasm of cecum    Loss of weight    Chronic gastritis     Past Surgical History:  Procedure Laterality Date   ABDOMINAL HYSTERECTOMY  2000   APPENDECTOMY     BACK SURGERY     CERVICAL FUSION  2011,2012   c2-7   COLONOSCOPY WITH PROPOFOL  N/A 04/13/2018   Procedure: COLONOSCOPY WITH PROPOFOL ;  Surgeon: Jinny Carmine, MD;  Location: [facility];  Service: Endoscopy;  Laterality: N/A;   ESOPHAGOGASTRODUODENOSCOPY (EGD) WITH PROPOFOL  N/A 04/13/2018   Procedure: ESOPHAGOGASTRODUODENOSCOPY (EGD) WITH PROPOFOL ;  Surgeon: Jinny Carmine, MD;  Location: [facility];  Service: Endoscopy;  Laterality: N/A;   LAPAROSCOPY N/A 05/25/2018   Procedure: LAPAROSCOPY DIAGNOSTIC;  Surgeon: Lovetta Debby PARAS, MD;  Location: [facility];  Service: Gynecology;  Laterality: N/A;   LYSIS OF ADHESION N/A 05/25/2018   Procedure: LYSIS OF ADHESION;  Surgeon: Schermerhorn, Debby PARAS, MD;  Location: [facility];  Service: Gynecology;  Laterality: N/A;   POLYPECTOMY  04/13/2018   Procedure: POLYPECTOMY;  Surgeon: Jinny Carmine, MD;  Location: [facility];  Service: Endoscopy;;   SKIN GRAFT  1981   from right thigh to bilateral ankles and right knee   SPINE SURGERY     TONSILLECTOMY  2013   TRACHELECTOMY N/A 05/25/2018   Procedure:  TRACHELECTOMY;  Surgeon: Schermerhorn, Debby PARAS, MD;  Location: [facility];  Service: Gynecology;  Laterality: N/A;   TUBAL LIGATION      OB History     Gravida  2   Para      Term      Preterm      AB      Living  1      SAB      IAB      Ectopic      Multiple      Live Births           Obstetric Comments  1st Menstrual Cycle: 17 1st Pregnancy:  21           Home Medications    Prior to Admission medications   Medication Sig Start Date End Date Taking? Authorizing Provider  azithromycin   (ZITHROMAX ) 250 MG tablet Take 1 tablet (250 mg total) by mouth daily. Take first 2 tablets together, then 1 every day until finished. 05/29/24  Yes Pilar Westergaard R, NP  predniSONE  (STERAPRED UNI-PAK 21 TAB) 10 MG (21) TBPK tablet Take by mouth daily. Take 6 tabs by mouth daily  for 1 days, then 5 tabs for 1 days, then 4 tabs for 1 days, then 3 tabs for 1 days, 2 tabs for 1 days, then 1 tab by mouth daily for 1 days 05/29/24  Yes Tilden Broz R, NP  albuterol  (VENTOLIN  HFA) 108 (90 Base) MCG/ACT inhaler Inhale 2 puffs into the lungs every 4 (four) hours as needed for wheezing or shortness of breath. 04/29/23   Ostwalt, Janna, PA-C  amLODipine  (NORVASC ) 5 MG tablet TAKE 1 TABLET (5 MG TOTAL) BY MOUTH DAILY. 12/22/23   Ostwalt, Janna, PA-C  amoxicillin -clavulanate (AUGMENTIN ) 875-125 MG tablet Take 1 tablet by mouth every 12 (twelve) hours. 05/24/24   Van Knee, MD  estrogens , conjugated, (PREMARIN ) 1.25 MG tablet TAKE 1 TABLET (1.25 MG) BY MOUTH DAILY 04/19/24   Ostwalt, Janna, PA-C  fluticasone  (FLONASE ) 50 MCG/ACT nasal spray Place 2 sprays into both nostrils daily. 05/24/24   Van Knee, MD  ibuprofen  (ADVIL ) 600 MG tablet Take 1 tablet (600 mg total) by mouth every 8 (eight) hours as needed. 05/24/24   Van Knee, MD  meloxicam  (MOBIC ) 15 MG tablet Take 1 tablet (15 mg total) by mouth daily. 04/26/24   Gasper Nancyann BRAVO, MD  metoprolol  tartrate (LOPRESSOR ) 50 MG tablet TAKE 1 TABLET BY MOUTH TWICE A DAY 12/22/23   Ostwalt, Janna, PA-C  pravastatin  (PRAVACHOL ) 40 MG tablet TAKE 1 TABLET(40 MG) BY MOUTH DAILY 04/08/22   McDonough, Tinnie POUR, PA-C  tamsulosin  (FLOMAX ) 0.4 MG CAPS capsule TAKE 1 CAPSULE BY MOUTH EVERY DAY 04/19/24   Pardue, Lauraine SAILOR, DO  traZODone  (DESYREL ) 100 MG tablet TAKE 1 TABLET BY MOUTH EVERY NIGHT AT BEDTIME FOR SLEEP 03/15/24   Ostwalt, Janna, PA-C    Family History Family History  Problem Relation Age of Onset   Heart disease Mother    Hypertension Mother     Stroke Mother    Alcohol abuse Father    Stroke Father    Cancer Father    Heart disease Father    Diabetes Sister    Diabetes Brother    Breast cancer Paternal Aunt        40's   Suicidality Maternal Grandmother     Social History Social History   Tobacco Use   Smoking status: Former    Current packs/day: 0.00  Average packs/day: 0.5 packs/day for 25.0 years (12.5 ttl pk-yrs)    Types: Cigarettes    Start date: 43    Quit date: 2018    Years since quitting: 7.5   Smokeless tobacco: Never   Tobacco comments:    not since september 30  Vaping Use   Vaping status: Never Used  Substance Use Topics   Alcohol use: Yes    Comment: once a week   Drug use: Yes    Types: Marijuana     Allergies   Pseudoephedrine, Caffeine, and Hydrocodone   Review of Systems Review of Systems  HENT:  Positive for ear pain.      Physical Exam Triage Vital Signs ED Triage Vitals  Encounter Vitals Group     BP 05/29/24 1400 135/82     Girls Systolic BP Percentile --      Girls Diastolic BP Percentile --      Boys Systolic BP Percentile --      Boys Diastolic BP Percentile --      Pulse Rate 05/29/24 1400 60     Resp 05/29/24 1400 18     Temp 05/29/24 1400 98.2 F (36.8 C)     Temp Source 05/29/24 1400 Oral     SpO2 05/29/24 1359 98 %     Weight --      Height --      Head Circumference --      Peak Flow --      Pain Score 05/29/24 1358 7     Pain Loc --      Pain Education --      Exclude from Growth Chart --    No data found.  Updated Vital Signs BP 135/82 (BP Location: Right Arm)   Pulse 60   Temp 98.2 F (36.8 C) (Oral)   Resp 18   SpO2 99%   Visual Acuity Right Eye Distance:   Left Eye Distance:   Bilateral Distance:    Right Eye Near:   Left Eye Near:    Bilateral Near:     Physical Exam Constitutional:      Appearance: Normal appearance.  HENT:     Left Ear: Hearing, tympanic membrane, ear canal and external ear normal.     Ears:      Comments: Edema affecting approximately 50% of the right tympanic membrane with mild bulging, no abnormality to the canal or external ear    Nose: Congestion present.     Right Sinus: Maxillary sinus tenderness present.     Mouth/Throat:     Pharynx: Posterior oropharyngeal erythema present. No oropharyngeal exudate.  Eyes:     Extraocular Movements: Extraocular movements intact.  Pulmonary:     Effort: Pulmonary effort is normal.  Musculoskeletal:     Cervical back: Normal range of motion.  Lymphadenopathy:     Cervical: Cervical adenopathy present.  Neurological:     Mental Status: She is alert and oriented to person, place, and time. Mental status is at baseline.      UC Treatments / Results  Labs (all labs ordered are listed, but only abnormal results are displayed) Labs Reviewed - No data to display  EKG   Radiology No results found.  Procedures Procedures (including critical care time)  Medications Ordered in UC Medications  ketorolac  (TORADOL ) 30 MG/ML injection 30 mg (30 mg Intramuscular Given 05/29/24 1431)    Initial Impression / Assessment and Plan / UC Course  I have reviewed the triage vital  signs and the nursing notes.  Pertinent labs & imaging results that were available during my care of the patient were reviewed by me and considered in my medical decision making (see chart for details).  Nonrecurrent seizures otitis media of the right ear, acute nonrecurrent maxillary sinusitis  Presentation is consistent with above diagnoses, do not believe infection has fully resolved, advise continuation of Augmentin  and additionally have added azithromycin  as well as prednisone , Toradol  IM given for management of discomfort prior to discharge, recommended continue supportive care with over-the-counter medications and nonpharmacological measures, advised follow-up and given walker referral to ear nose and throat if no improvement seen Final Clinical Impressions(s) / UC  Diagnoses   Final diagnoses:  Non-recurrent acute serous otitis media of right ear  Acute non-recurrent maxillary sinusitis     Discharge Instructions      You are being treated for sinus infection and ear infection  Continue Augmentin  as directed  Begin azithromycin  as directed for additional bacterial coverage  You have been given an injection of Toradol  to help reduce inflammation and help with pain and ideally will see improvement within an hour  Starting tomorrow take prednisone  every morning with food to further reduce sinus pressure, take as directed, avoid use of ibuprofen  during treatment but may use Tylenol     For cough: honey 1/2 to 1 teaspoon (you can dilute the honey in water  or another fluid).  You can also use guaifenesin and dextromethorphan for cough. You can use a humidifier for chest congestion and cough.  If you don't have a humidifier, you can sit in the bathroom with the hot shower running.      For sore throat: try warm salt water  gargles, cepacol lozenges, throat spray, warm tea or water  with lemon/honey, popsicles or ice, or OTC cold relief medicine for throat discomfort.   For congestion: take a daily anti-histamine like Zyrtec , Claritin , and a oral decongestant, such as pseudoephedrine.  You can also use Flonase  1-2 sprays in each nostril daily.   It is important to stay hydrated: drink plenty of fluids (water , gatorade/powerade/pedialyte, juices, or teas) to keep your throat moisturized and help further relieve irritation/discomfort.    ED Prescriptions     Medication Sig Dispense Auth. Provider   azithromycin  (ZITHROMAX ) 250 MG tablet Take 1 tablet (250 mg total) by mouth daily. Take first 2 tablets together, then 1 every day until finished. 6 tablet Neely Kammerer R, NP   predniSONE  (STERAPRED UNI-PAK 21 TAB) 10 MG (21) TBPK tablet Take by mouth daily. Take 6 tabs by mouth daily  for 1 days, then 5 tabs for 1 days, then 4 tabs for 1 days, then 3 tabs  for 1 days, 2 tabs for 1 days, then 1 tab by mouth daily for 1 days 21 tablet Calix Heinbaugh, Shelba SAUNDERS, NP      PDMP not reviewed this encounter.   Teresa Shelba SAUNDERS, NP 05/29/24 1447

## 2024-05-30 NOTE — Progress Notes (Deleted)
 Established patient visit  Patient: Misty Robbins   DOB: 16-Mar-1971   53 y.o. Female  MRN: [id number] Visit Date: 05/31/2024  Today's healthcare provider: Jolynn Spencer, PA-C   No chief complaint on file.  Subjective       Discussed the use of AI scribe software for clinical note transcription with the patient, who gave verbal consent to proceed.  History of Present Illness        04/14/2024   11:36 AM 07/28/2023    2:47 PM 04/14/2023   12:08 PM  Depression screen PHQ 2/9  Decreased Interest 0  1  Down, Depressed, Hopeless 0  1  PHQ - 2 Score 0  2  Altered sleeping 0  1  Tired, decreased energy 0  1  Change in appetite 0  1  Feeling bad or failure about yourself  0  1  Trouble concentrating 0  0  Moving slowly or fidgety/restless 0  0  Suicidal thoughts 0  0  PHQ-9 Score 0  6  Difficult doing work/chores Not difficult at all  Not difficult at all     Information is confidential and restricted. Go to Review Flowsheets to unlock data.      07/28/2023    2:47 PM 03/06/2023   11:06 AM  GAD 7 : Generalized Anxiety Score  Nervous, Anxious, on Edge    Control/stop worrying    Worry too much - different things    Trouble relaxing    Restless    Easily annoyed or irritable    Afraid - awful might happen    Total GAD 7 Score    Anxiety Difficulty       Information is confidential and restricted. Go to Review Flowsheets to unlock data.    Medications: Outpatient Medications Prior to Visit  Medication Sig  . albuterol  (VENTOLIN  HFA) 108 (90 Base) MCG/ACT inhaler Inhale 2 puffs into the lungs every 4 (four) hours as needed for wheezing or shortness of breath.  . amLODipine  (NORVASC ) 5 MG tablet TAKE 1 TABLET (5 MG TOTAL) BY MOUTH DAILY.  . amoxicillin -clavulanate (AUGMENTIN ) 875-125 MG tablet Take 1 tablet by mouth every 12 (twelve) hours.  . azithromycin  (ZITHROMAX ) 250 MG tablet Take 1 tablet (250 mg total) by mouth daily. Take first 2 tablets together, then 1 every  day until finished.  . estrogens , conjugated, (PREMARIN ) 1.25 MG tablet TAKE 1 TABLET (1.25 MG) BY MOUTH DAILY  . fluticasone  (FLONASE ) 50 MCG/ACT nasal spray Place 2 sprays into both nostrils daily.  . ibuprofen  (ADVIL ) 600 MG tablet Take 1 tablet (600 mg total) by mouth every 8 (eight) hours as needed.  . [Paused] meloxicam  (MOBIC ) 15 MG tablet Take 1 tablet (15 mg total) by mouth daily.  . metoprolol  tartrate (LOPRESSOR ) 50 MG tablet TAKE 1 TABLET BY MOUTH TWICE A DAY  . pravastatin  (PRAVACHOL ) 40 MG tablet TAKE 1 TABLET(40 MG) BY MOUTH DAILY  . predniSONE  (STERAPRED UNI-PAK 21 TAB) 10 MG (21) TBPK tablet Take by mouth daily. Take 6 tabs by mouth daily  for 1 days, then 5 tabs for 1 days, then 4 tabs for 1 days, then 3 tabs for 1 days, 2 tabs for 1 days, then 1 tab by mouth daily for 1 days  . tamsulosin  (FLOMAX ) 0.4 MG CAPS capsule TAKE 1 CAPSULE BY MOUTH EVERY DAY  . traZODone  (DESYREL ) 100 MG tablet TAKE 1 TABLET BY MOUTH EVERY NIGHT AT BEDTIME FOR SLEEP   No facility-administered medications prior to visit.  Review of Systems  All other systems reviewed and are negative.  All negative Except see HPI   {Insert previous labs (optional):23779} {See past labs  Heme  Chem  Endocrine  Serology  Results Review (optional):1}   Objective    There were no vitals taken for this visit. {Insert last BP/Wt (optional):23777}{See vitals history (optional):1}   Physical Exam Vitals reviewed.  Constitutional:      General: She is not in acute distress.    Appearance: Normal appearance. She is well-developed. She is not diaphoretic.  HENT:     Head: Normocephalic and atraumatic.  Eyes:     General: No scleral icterus.    Conjunctiva/sclera: Conjunctivae normal.  Neck:     Thyroid : No thyromegaly.  Cardiovascular:     Rate and Rhythm: Normal rate and regular rhythm.     Pulses: Normal pulses.     Heart sounds: Normal heart sounds. No murmur heard. Pulmonary:     Effort:  Pulmonary effort is normal. No respiratory distress.     Breath sounds: Normal breath sounds. No wheezing, rhonchi or rales.  Musculoskeletal:     Cervical back: Neck supple.     Right lower leg: No edema.     Left lower leg: No edema.  Lymphadenopathy:     Cervical: No cervical adenopathy.  Skin:    General: Skin is warm and dry.     Findings: No rash.  Neurological:     Mental Status: She is alert and oriented to person, place, and time. Mental status is at baseline.  Psychiatric:        Mood and Affect: Mood normal.        Behavior: Behavior normal.     No results found for any visits on 05/31/24.      Assessment and Plan Assessment & Plan     No orders of the defined types were placed in this encounter.   No follow-ups on file.   The patient was advised to call back or seek an in-person evaluation if the symptoms worsen or if the condition fails to improve as anticipated.  I discussed the assessment and treatment plan with the patient. The patient was provided an opportunity to ask questions and all were answered. The patient agreed with the plan and demonstrated an understanding of the instructions.  I, Kemonte Ullman, PA-C have reviewed all documentation for this visit. The documentation on 05/31/2024  for the exam, diagnosis, procedures, and orders are all accurate and complete.  Jolynn Spencer, [facility], MMS [facility] [phone] (phone) [phone] (fax)  [facility]

## 2024-05-31 ENCOUNTER — Other Ambulatory Visit: Payer: Self-pay | Admitting: Physician Assistant

## 2024-05-31 ENCOUNTER — Ambulatory Visit: Admitting: Physician Assistant

## 2024-05-31 DIAGNOSIS — I10 Essential (primary) hypertension: Secondary | ICD-10-CM

## 2024-05-31 NOTE — Telephone Encounter (Signed)
 Patient was seen at [facility] on 05/29/24

## 2024-06-01 ENCOUNTER — Encounter: Payer: Self-pay | Admitting: Physician Assistant

## 2024-06-01 ENCOUNTER — Ambulatory Visit (INDEPENDENT_AMBULATORY_CARE_PROVIDER_SITE_OTHER): Admitting: Physician Assistant

## 2024-06-01 VITALS — BP 117/78 | HR 64 | Temp 98.1°F | Resp 14 | Ht 67.0 in | Wt 144.4 lb

## 2024-06-01 DIAGNOSIS — R0981 Nasal congestion: Secondary | ICD-10-CM | POA: Diagnosis not present

## 2024-06-01 DIAGNOSIS — J329 Chronic sinusitis, unspecified: Secondary | ICD-10-CM | POA: Diagnosis not present

## 2024-06-01 MED ORDER — LEVOCETIRIZINE DIHYDROCHLORIDE 5 MG PO TABS: 5.0000 mg | ORAL_TABLET | Freq: Every evening | ORAL | 2 refills | Status: DC

## 2024-06-01 NOTE — Progress Notes (Signed)
 Established patient visit  Patient: Misty Robbins   DOB: 12-25-1970   53 y.o. Female  MRN: [id number] Visit Date: 06/01/2024  Today's healthcare provider: Jolynn Spencer, PA-C   Chief Complaint  Patient presents with   Follow-up    Urgent care f/u for sinus infection Complains ongoing green mucus, R ear clogged. Had developed diarrhea that subsided along with stomach pain. Otc: mucinex dm, flonase    Subjective     HPI     Follow-up    Additional comments: Urgent care f/u for sinus infection Complains ongoing green mucus, R ear clogged. Had developed diarrhea that subsided along with stomach pain. Otc: mucinex dm, flonase       Last edited by Wilfred Hargis RAMAN, CMA on 06/01/2024  3:55 PM.       Discussed the use of AI scribe software for clinical note transcription with the patient, who gave verbal consent to proceed.  History of Present Illness Misty Robbins is a 53 year old female who presents with persistent nasal congestion and ear pressure despite treatment.  She experiences persistent nasal congestion and right ear pressure, unrelieved by Augmentin , nearing completion of a Z-Pak, and a six-day steroid taper. She continues using Flonase  and Mucinex DM. The right ear pressure feels like it is pushing against the eardrum and was previously severe. Symptomatic treatments include Zarxio, increased water  intake, nasal saline rinse, and daily neti pot use, but symptoms persist. Every morning, she has green nasal discharge.  Approximately a month ago, she was treated with 1000 mg of amoxicillin  during a virtual visit, which did not resolve her symptoms. She denies fever, cough, wheezing, shortness of breath, or chest pain. She denies recent viral infections, including COVID-19. Symptoms began with a runny nose and worsened about a month and a half ago, initially triggered by pollen exposure.       06/01/2024    3:57 PM 04/14/2024   11:36 AM 07/28/2023    2:47 PM  Depression screen  PHQ 2/9  Decreased Interest 0 0   Down, Depressed, Hopeless 0 0   PHQ - 2 Score 0 0   Altered sleeping 1 0   Tired, decreased energy 2 0   Change in appetite 1 0   Feeling bad or failure about yourself  0 0   Trouble concentrating 2 0   Moving slowly or fidgety/restless 0 0   Suicidal thoughts 0 0   PHQ-9 Score 6 0   Difficult doing work/chores Somewhat difficult Not difficult at all      Information is confidential and restricted. Go to Review Flowsheets to unlock data.      06/01/2024    3:57 PM 07/28/2023    2:47 PM 03/06/2023   11:06 AM  GAD 7 : Generalized Anxiety Score  Nervous, Anxious, on Edge 0    Control/stop worrying 0    Worry too much - different things 0    Trouble relaxing 2    Restless 1    Easily annoyed or irritable 1    Afraid - awful might happen 0    Total GAD 7 Score 4    Anxiety Difficulty Somewhat difficult       Information is confidential and restricted. Go to Review Flowsheets to unlock data.    Medications: Outpatient Medications Prior to Visit  Medication Sig   albuterol  (VENTOLIN  HFA) 108 (90 Base) MCG/ACT inhaler Inhale 2 puffs into the lungs every 4 (four) hours as needed for wheezing or shortness of breath.  amLODipine  (NORVASC ) 5 MG tablet TAKE 1 TABLET (5 MG TOTAL) BY MOUTH DAILY.   azithromycin  (ZITHROMAX ) 250 MG tablet Take 1 tablet (250 mg total) by mouth daily. Take first 2 tablets together, then 1 every day until finished.   estrogens , conjugated, (PREMARIN ) 1.25 MG tablet TAKE 1 TABLET (1.25 MG) BY MOUTH DAILY   fluticasone  (FLONASE ) 50 MCG/ACT nasal spray Place 2 sprays into both nostrils daily.   ibuprofen  (ADVIL ) 600 MG tablet Take 1 tablet (600 mg total) by mouth every 8 (eight) hours as needed.   meloxicam  (MOBIC ) 15 MG tablet Take 1 tablet (15 mg total) by mouth daily.   metoprolol  tartrate (LOPRESSOR ) 50 MG tablet TAKE 1 TABLET BY MOUTH TWICE A DAY   pravastatin  (PRAVACHOL ) 40 MG tablet TAKE 1 TABLET(40 MG) BY MOUTH DAILY    predniSONE  (STERAPRED UNI-PAK 21 TAB) 10 MG (21) TBPK tablet Take by mouth daily. Take 6 tabs by mouth daily  for 1 days, then 5 tabs for 1 days, then 4 tabs for 1 days, then 3 tabs for 1 days, 2 tabs for 1 days, then 1 tab by mouth daily for 1 days   tamsulosin  (FLOMAX ) 0.4 MG CAPS capsule TAKE 1 CAPSULE BY MOUTH EVERY DAY   traZODone  (DESYREL ) 100 MG tablet TAKE 1 TABLET BY MOUTH EVERY NIGHT AT BEDTIME FOR SLEEP   amoxicillin -clavulanate (AUGMENTIN ) 875-125 MG tablet Take 1 tablet by mouth every 12 (twelve) hours.   No facility-administered medications prior to visit.    Review of Systems All negative Except see HPI       Objective    BP 117/78 (BP Location: Left Arm, Patient Position: Sitting, Cuff Size: Normal)   Pulse 64   Temp 98.1 F (36.7 C) (Oral)   Resp 14   Ht 5' 7 (1.702 m)   Wt 144 lb 6.4 oz (65.5 kg)   SpO2 100%   BMI 22.62 kg/m     Physical Exam Vitals reviewed.  Constitutional:      Appearance: She is normal weight.  HENT:     Head: Normocephalic and atraumatic.     Right Ear: Ear canal and external ear normal.     Left Ear: Ear canal and external ear normal. There is impacted cerumen.     Nose: Congestion and rhinorrhea present.     Mouth/Throat:     Pharynx: No posterior oropharyngeal erythema.     Comments: Postnasal drainage noted Eyes:     General: No scleral icterus.       Right eye: No discharge.        Left eye: No discharge.     Extraocular Movements: Extraocular movements intact.     Pupils: Pupils are equal, round, and reactive to light.  Cardiovascular:     Rate and Rhythm: Normal rate and regular rhythm.  Pulmonary:     Effort: Pulmonary effort is normal.     Breath sounds: Normal breath sounds.  Abdominal:     General: Abdomen is flat. Bowel sounds are normal.     Palpations: Abdomen is soft.  Lymphadenopathy:     Cervical: No cervical adenopathy.  Neurological:     Mental Status: She is alert.      No results found for any  visits on 06/01/24.      Assessment & Plan Chronic sinusitis with persistent nasal congestion and right ear pressure Symptoms persisted despite Augmentin , Z-Pak, and steroids. Fluid on eardrum and significant nasal congestion noted. Differential includes unresolved sinus infection or  other ENT pathology. - Continue prednisone  for 2 more days. - Start Zyrtec . - Refer to ENT for further evaluation. - Schedule follow-up appointment on Friday. - Consider CT if symptoms persist.  Allergic rhinitis Contributing to nasal congestion with green nasal discharge. Using Flonase  and neti pot effectively. - Continue Flonase . - Use nasal saline rinse twice daily. - Consider stronger antihistamine if Zyrtec  is not effective.  Sinus congestion (Primary) Allergic rhinitis Recurrent sinus infections  - Ambulatory referral to ENT - levocetirizine (XYZAL ) 5 MG tablet; Take 1 tablet (5 mg total) by mouth every evening.  Dispense: 30 tablet; Refill: 2   Orders Placed This Encounter  Procedures   Ambulatory referral to ENT    Referral Priority:   Routine    Referral Type:   Consultation    Referral Reason:   Specialty Services Required    Requested Specialty:   Otolaryngology    Number of Visits Requested:   1    Return in about 3 days (around 06/04/2024) for as scheduled, please, schedule with me.   The patient was advised to call back or seek an in-person evaluation if the symptoms worsen or if the condition fails to improve as anticipated.  I discussed the assessment and treatment plan with the patient. The patient was provided an opportunity to ask questions and all were answered. The patient agreed with the plan and demonstrated an understanding of the instructions.  I, Ronniesha Seibold, PA-C have reviewed all documentation for this visit. The documentation on 06/01/2024  for the exam, diagnosis, procedures, and orders are all accurate and complete.  Jolynn Spencer, [facility], MMS [facility] [phone] (phone) [phone] (fax)  [facility]

## 2024-06-03 DIAGNOSIS — H6122 Impacted cerumen, left ear: Secondary | ICD-10-CM | POA: Diagnosis not present

## 2024-06-03 DIAGNOSIS — D3709 Neoplasm of uncertain behavior of other specified sites of the oral cavity: Secondary | ICD-10-CM | POA: Diagnosis not present

## 2024-06-03 DIAGNOSIS — J324 Chronic pansinusitis: Secondary | ICD-10-CM | POA: Diagnosis not present

## 2024-06-03 DIAGNOSIS — H6981 Other specified disorders of Eustachian tube, right ear: Secondary | ICD-10-CM | POA: Diagnosis not present

## 2024-06-03 DIAGNOSIS — J329 Chronic sinusitis, unspecified: Secondary | ICD-10-CM | POA: Diagnosis not present

## 2024-06-03 DIAGNOSIS — R591 Generalized enlarged lymph nodes: Secondary | ICD-10-CM | POA: Diagnosis not present

## 2024-06-04 ENCOUNTER — Ambulatory Visit: Admitting: Physician Assistant

## 2024-06-07 ENCOUNTER — Other Ambulatory Visit: Payer: Self-pay | Admitting: Otolaryngology

## 2024-06-07 DIAGNOSIS — K1370 Unspecified lesions of oral mucosa: Secondary | ICD-10-CM

## 2024-06-07 DIAGNOSIS — R591 Generalized enlarged lymph nodes: Secondary | ICD-10-CM

## 2024-06-07 DIAGNOSIS — J329 Chronic sinusitis, unspecified: Secondary | ICD-10-CM

## 2024-06-11 ENCOUNTER — Other Ambulatory Visit: Payer: Self-pay | Admitting: Physician Assistant

## 2024-06-11 DIAGNOSIS — F5101 Primary insomnia: Secondary | ICD-10-CM

## 2024-06-17 ENCOUNTER — Ambulatory Visit
Admission: RE | Admit: 2024-06-17 | Discharge: 2024-06-17 | Disposition: A | Discharge status: Home / Self Care | Source: Ambulatory Visit | Attending: Otolaryngology | Admitting: Otolaryngology

## 2024-06-17 DIAGNOSIS — R599 Enlarged lymph nodes, unspecified: Secondary | ICD-10-CM | POA: Diagnosis not present

## 2024-06-17 DIAGNOSIS — R591 Generalized enlarged lymph nodes: Secondary | ICD-10-CM

## 2024-06-17 DIAGNOSIS — R221 Localized swelling, mass and lump, neck: Secondary | ICD-10-CM | POA: Diagnosis not present

## 2024-06-17 DIAGNOSIS — K1370 Unspecified lesions of oral mucosa: Secondary | ICD-10-CM

## 2024-06-17 DIAGNOSIS — J329 Chronic sinusitis, unspecified: Secondary | ICD-10-CM

## 2024-06-17 MED ORDER — IOPAMIDOL (ISOVUE-300) INJECTION 61%
75.0000 mL | Freq: Once | INTRAVENOUS | Status: AC | PRN
  Administered 2024-06-17: 75 mL via INTRAVENOUS

## 2024-07-01 ENCOUNTER — Ambulatory Visit: Admitting: Physician Assistant

## 2024-07-01 ENCOUNTER — Encounter: Payer: Self-pay | Admitting: Physician Assistant

## 2024-07-01 VITALS — BP 118/79 | HR 52 | Ht 67.0 in | Wt 142.0 lb

## 2024-07-01 DIAGNOSIS — Z9889 Other specified postprocedural states: Secondary | ICD-10-CM

## 2024-07-01 DIAGNOSIS — R937 Abnormal findings on diagnostic imaging of other parts of musculoskeletal system: Secondary | ICD-10-CM | POA: Diagnosis not present

## 2024-07-01 DIAGNOSIS — M542 Cervicalgia: Secondary | ICD-10-CM

## 2024-07-01 NOTE — Progress Notes (Signed)
 Established patient visit  Patient: Misty Robbins   DOB: 1970/12/19   53 y.o. Female  MRN: [id number] Visit Date: 07/01/2024  Today's healthcare provider: Jolynn Spencer, PA-C   Chief Complaint  Patient presents with   Referral    Patient presents with need for referral to surgery, states she had a CT scan and was told there are broken screws from a previous neck surgery. Patient prefers to be referred to a [facility] location    Subjective     HPI     Referral    Additional comments: Patient presents with need for referral to surgery, states she had a CT scan and was told there are broken screws from a previous neck surgery. Patient prefers to be referred to a [facility] location       Last edited by Cherry Chiquita HERO, CMA on 07/01/2024  2:05 PM.       Discussed the use of AI scribe software for clinical note transcription with the patient, who gave verbal consent to proceed.  History of Present Illness Misty Robbins is a 53 year old female who presents with neck pain and concerns about a broken screw in her cervical spine.  She experiences neck pain and is concerned about a broken screw in her cervical spine. A CT scan of her neck and head on June 28, 2024, revealed issues with the fusions in her neck and a broken screw at the C4 cortical screw ends. She is worried about the possibility of needing surgery to address the hardware complication and prefers to avoid having surgery at [facility].       07/01/2024    2:09 PM 06/01/2024    3:57 PM 04/14/2024   11:36 AM  Depression screen PHQ 2/9  Decreased Interest 0 0 0  Down, Depressed, Hopeless 1 0 0  PHQ - 2 Score 1 0 0  Altered sleeping 0 1 0  Tired, decreased energy 1 2 0  Change in appetite 0 1 0  Feeling bad or failure about yourself  0 0 0  Trouble concentrating 2 2 0  Moving slowly or fidgety/restless 0 0 0  Suicidal thoughts 0 0 0  PHQ-9 Score 4 6 0  Difficult doing work/chores Somewhat difficult Somewhat  difficult Not difficult at all      07/01/2024    2:09 PM 06/01/2024    3:57 PM 07/28/2023    2:47 PM 03/06/2023   11:06 AM  GAD 7 : Generalized Anxiety Score  Nervous, Anxious, on Edge 0 0    Control/stop worrying 1 0    Worry too much - different things 0 0    Trouble relaxing 1 2    Restless 1 1    Easily annoyed or irritable 1 1    Afraid - awful might happen 0 0    Total GAD 7 Score 4 4    Anxiety Difficulty Somewhat difficult Somewhat difficult       Information is confidential and restricted. Go to Review Flowsheets to unlock data.    Medications: Outpatient Medications Prior to Visit  Medication Sig   albuterol  (VENTOLIN  HFA) 108 (90 Base) MCG/ACT inhaler Inhale 2 puffs into the lungs every 4 (four) hours as needed for wheezing or shortness of breath.   amLODipine  (NORVASC ) 5 MG tablet TAKE 1 TABLET (5 MG TOTAL) BY MOUTH DAILY.   estrogens , conjugated, (PREMARIN ) 1.25 MG tablet TAKE 1 TABLET (1.25 MG) BY MOUTH DAILY   ibuprofen  (ADVIL ) 600  MG tablet Take 1 tablet (600 mg total) by mouth every 8 (eight) hours as needed.   levocetirizine (XYZAL ) 5 MG tablet Take 1 tablet (5 mg total) by mouth every evening.   meloxicam  (MOBIC ) 15 MG tablet Take 1 tablet (15 mg total) by mouth daily.   metoprolol  tartrate (LOPRESSOR ) 50 MG tablet TAKE 1 TABLET BY MOUTH TWICE A DAY   pravastatin  (PRAVACHOL ) 40 MG tablet TAKE 1 TABLET(40 MG) BY MOUTH DAILY   traZODone  (DESYREL ) 100 MG tablet TAKE 1 TABLET BY MOUTH EVERY NIGHT AT BEDTIME FOR SLEEP   [DISCONTINUED] azithromycin  (ZITHROMAX ) 250 MG tablet Take 1 tablet (250 mg total) by mouth daily. Take first 2 tablets together, then 1 every day until finished.   [DISCONTINUED] fluticasone  (FLONASE ) 50 MCG/ACT nasal spray Place 2 sprays into both nostrils daily.   [DISCONTINUED] predniSONE  (STERAPRED UNI-PAK 21 TAB) 10 MG (21) TBPK tablet Take by mouth daily. Take 6 tabs by mouth daily  for 1 days, then 5 tabs for 1 days, then 4 tabs for 1 days, then 3  tabs for 1 days, 2 tabs for 1 days, then 1 tab by mouth daily for 1 days   [DISCONTINUED] tamsulosin  (FLOMAX ) 0.4 MG CAPS capsule TAKE 1 CAPSULE BY MOUTH EVERY DAY   No facility-administered medications prior to visit.    Review of Systems All negative Except see HPI       Objective    BP 118/79 (BP Location: Right Arm, Patient Position: Sitting, Cuff Size: Normal)   Pulse (!) 52   Ht 5' 7 (1.702 m)   Wt 142 lb (64.4 kg)   SpO2 98%   BMI 22.24 kg/m     Physical Exam Constitutional:      General: She is not in acute distress.    Appearance: Normal appearance.  HENT:     Head: Normocephalic.  Pulmonary:     Effort: Pulmonary effort is normal. No respiratory distress.  Musculoskeletal:        General: Tenderness present. No swelling or deformity.  Neurological:     Mental Status: She is alert and oriented to person, place, and time. Mental status is at baseline.      No results found for any visits on 07/01/24.       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 She prefers to avoid surgery at [facility]. - Refer to [facility] surgery for urgent evaluation of cervical spine hardware complication. - Ensure referral to orthopedics general to expedite evaluation. - Advised her that [facility] will likely contact her due to the urgency. - Plan follow-up in 2-3 months, especially if surgery is performed.    Orders Placed This Encounter  Procedures   Ambulatory referral to Orthopedic Surgery    Referral Priority:   Urgent    Referral Type:   Surgical    Referral Reason:   Specialty Services Required    Requested Specialty:   Orthopedic Surgery    Number of Visits Requested:   1    No  follow-ups on file.   The patient was advised to call back or seek an in-person evaluation if the symptoms worsen or if the condition fails to improve as anticipated.  I discussed the assessment and treatment plan with the patient.  The patient was provided an opportunity to ask questions and all were answered. The patient agreed with the plan and demonstrated an understanding of the instructions.  I, Augustus Zurawski, PA-C have reviewed all documentation for this visit. The documentation on 07/01/2024  for the exam, diagnosis, procedures, and orders are all accurate and complete.  Jolynn Spencer, [facility], MMS [facility] [phone] (phone) [phone] (fax)  [facility]

## 2024-07-16 ENCOUNTER — Other Ambulatory Visit: Payer: Self-pay | Admitting: Family Medicine

## 2024-07-16 DIAGNOSIS — N200 Calculus of kidney: Secondary | ICD-10-CM

## 2024-07-18 ENCOUNTER — Other Ambulatory Visit: Payer: Self-pay | Admitting: Family Medicine

## 2024-07-19 NOTE — Telephone Encounter (Signed)
 Requested medications are due for refill today.  yes  Requested medications are on the active medications list.  yes  Last refill. 04/26/2024 #30 2 rf  Future visit scheduled.   A wellness visit  Notes to clinic.  Expired labs.    Requested Prescriptions  Pending Prescriptions Disp Refills   meloxicam  (MOBIC ) 15 MG tablet [Pharmacy Med Name: MELOXICAM  15 MG TABLET] 30 tablet 2    Sig: Take 1 tablet (15 mg total) by mouth daily.     Analgesics:  COX2 Inhibitors Failed - 07/19/2024  3:15 PM      Failed - Manual Review: Labs are only required if the patient has taken medication for more than 8 weeks.      Failed - AST in normal range and within 360 days    AST  Date Value Ref Range Status  04/29/2023 16 0 - 40 IU/L Final   SGOT(AST)  Date Value Ref Range Status  10/09/2012 20 15 - 37 Unit/L Final         Failed - ALT in normal range and within 360 days    ALT  Date Value Ref Range Status  04/29/2023 10 0 - 32 IU/L Final   SGPT (ALT)  Date Value Ref Range Status  10/09/2012 16 12 - 78 U/L Final         Passed - HGB in normal range and within 360 days    Hemoglobin  Date Value Ref Range Status  03/27/2024 13.3 12.0 - 15.0 g/dL Final  97/87/7975 86.4 11.1 - 15.9 g/dL Final         Passed - Cr in normal range and within 360 days    Creatinine  Date Value Ref Range Status  10/09/2012 0.92 0.60 - 1.30 mg/dL Final   Creatinine, Ser  Date Value Ref Range Status  03/27/2024 0.80 0.44 - 1.00 mg/dL Final         Passed - HCT in normal range and within 360 days    HCT  Date Value Ref Range Status  03/27/2024 39.7 36.0 - 46.0 % Final   Hematocrit  Date Value Ref Range Status  12/09/2022 40.6 34.0 - 46.6 % Final         Passed - eGFR is 30 or above and within 360 days    EGFR (African American)  Date Value Ref Range Status  10/09/2012 >60  Final   GFR calc Af Amer  Date Value Ref Range Status  10/04/2020 92 >59 mL/min/1.73 Final    Comment:    **In accordance  with recommendations from the NKF-ASN Task force,**   [facility] is in the process of updating its eGFR calculation to the   2021 CKD-EPI creatinine equation that estimates kidney function   without a race variable.    EGFR (Non-African Amer.)  Date Value Ref Range Status  10/09/2012 >60  Final    Comment:    eGFR values <36mL/min/1.73 m2 may be an indication of chronic kidney disease (CKD). Calculated eGFR is useful in patients with stable renal function. The eGFR calculation will not be reliable in acutely ill patients when serum creatinine is changing rapidly. It is not useful in  patients on dialysis. The eGFR calculation may not be applicable to patients at the low and high extremes of body sizes, pregnant women, and vegetarians.    GFR, Estimated  Date Value Ref Range Status  03/27/2024 >60 >60 mL/min Final    Comment:    (NOTE) Calculated using the  CKD-EPI Creatinine Equation (2021)    eGFR  Date Value Ref Range Status  04/29/2023 80 >59 mL/min/1.73 Final         Passed - Patient is not pregnant      Passed - Valid encounter within last 12 months    Recent Outpatient Visits           2 weeks ago Neck pain with history of cervical spinal surgery   [facility] [place], [place], PA-C   1 month ago Sinus congestion   [facility] [place], [place], PA-C   2 months ago Other sinusitis, unspecified chronicity    [facility] [place] Nancyann BRAVO, MD   3 months ago Nephrolithiasis   [facility] [place], Lauraine SAILOR, [place]

## 2024-07-21 NOTE — Telephone Encounter (Signed)
 Courtesy refill Needs to labs before the next refill

## 2024-07-24 ENCOUNTER — Other Ambulatory Visit: Payer: Self-pay | Admitting: Physician Assistant

## 2024-07-24 DIAGNOSIS — I10 Essential (primary) hypertension: Secondary | ICD-10-CM

## 2024-07-24 DIAGNOSIS — Z780 Asymptomatic menopausal state: Secondary | ICD-10-CM

## 2024-08-24 DIAGNOSIS — S12300A Unspecified displaced fracture of fourth cervical vertebra, initial encounter for closed fracture: Secondary | ICD-10-CM | POA: Diagnosis not present

## 2024-08-24 DIAGNOSIS — S12600A Unspecified displaced fracture of seventh cervical vertebra, initial encounter for closed fracture: Secondary | ICD-10-CM | POA: Diagnosis not present

## 2024-08-24 DIAGNOSIS — S129XXA Fracture of neck, unspecified, initial encounter: Secondary | ICD-10-CM | POA: Diagnosis not present

## 2024-08-24 DIAGNOSIS — S12500A Unspecified displaced fracture of sixth cervical vertebra, initial encounter for closed fracture: Secondary | ICD-10-CM | POA: Diagnosis not present

## 2024-09-01 ENCOUNTER — Other Ambulatory Visit: Payer: Self-pay | Admitting: Physician Assistant

## 2024-09-01 DIAGNOSIS — I10 Essential (primary) hypertension: Secondary | ICD-10-CM

## 2024-09-01 DIAGNOSIS — Z780 Asymptomatic menopausal state: Secondary | ICD-10-CM

## 2024-09-07 ENCOUNTER — Telehealth: Payer: Self-pay | Admitting: Physician Assistant

## 2024-09-07 NOTE — Telephone Encounter (Signed)
 Sending surgical clearance form back to Janna from [facility].

## 2024-09-09 NOTE — Telephone Encounter (Signed)
 Appt scheduled for 09/10/24

## 2024-09-10 ENCOUNTER — Encounter: Payer: Self-pay | Admitting: Physician Assistant

## 2024-09-10 ENCOUNTER — Ambulatory Visit (INDEPENDENT_AMBULATORY_CARE_PROVIDER_SITE_OTHER): Admitting: Physician Assistant

## 2024-09-10 VITALS — BP 106/67 | HR 52 | Resp 16 | Ht 67.0 in | Wt 150.3 lb

## 2024-09-10 DIAGNOSIS — E782 Mixed hyperlipidemia: Secondary | ICD-10-CM

## 2024-09-10 DIAGNOSIS — M549 Dorsalgia, unspecified: Secondary | ICD-10-CM

## 2024-09-10 DIAGNOSIS — I071 Rheumatic tricuspid insufficiency: Secondary | ICD-10-CM

## 2024-09-10 DIAGNOSIS — M542 Cervicalgia: Secondary | ICD-10-CM | POA: Diagnosis not present

## 2024-09-10 DIAGNOSIS — R5383 Other fatigue: Secondary | ICD-10-CM

## 2024-09-10 DIAGNOSIS — F319 Bipolar disorder, unspecified: Secondary | ICD-10-CM

## 2024-09-10 DIAGNOSIS — Z9181 History of falling: Secondary | ICD-10-CM

## 2024-09-10 DIAGNOSIS — Z01818 Encounter for other preprocedural examination: Secondary | ICD-10-CM | POA: Diagnosis not present

## 2024-09-10 DIAGNOSIS — I059 Rheumatic mitral valve disease, unspecified: Secondary | ICD-10-CM

## 2024-09-10 DIAGNOSIS — G8929 Other chronic pain: Secondary | ICD-10-CM

## 2024-09-10 DIAGNOSIS — I10 Essential (primary) hypertension: Secondary | ICD-10-CM

## 2024-09-10 DIAGNOSIS — F5101 Primary insomnia: Secondary | ICD-10-CM

## 2024-09-10 NOTE — Progress Notes (Signed)
 Established patient visit  Patient: Misty Robbins   DOB: 03/28/1971   53 y.o. Female  MRN: [id number] Visit Date: 09/10/2024  Today's healthcare provider: Jolynn Spencer, PA-C   Chief Complaint  Patient presents with   Pre-op Exam    Pre-op clearance   Subjective     Discussed the use of AI scribe software for clinical note transcription with the patient, who gave verbal consent to proceed.  History of Present Illness Misty Robbins is a 53 year old female with mitral valve and tricuspid valve regurgitation who presents for preoperative evaluation.  She is scheduled for neck surgery on September 22, 2024, due to complications from a previous spinal fusion surgery. Increased neck pain and mechanical issues with her spinal hardware followed a fall three weeks ago, resulting in a broken titanium plate and screws backing out. This necessitates an extension of her spinal fusion from C2 to T1. Her neck frequently pops, sometimes audibly from across the room.  She has mitral valve and tricuspid valve regurgitation. She has not seen a cardiologist recently and last had an echocardiogram in 2020, which was functional. An EKG is scheduled as part of her preoperative workup.  She experiences low levels of anxiety and depression, despite having undergone surgery twice before and feeling apprehensive about the upcoming procedure. She completed a questionnaire today, scoring low for both conditions.  She has elevated lipids, last checked in 2024, and is on pravastatin . She also has prediabetes and hypertension, managed with amlodipine  and metoprolol .  She experiences fatigue and poor sleep, attributed to pain. She takes meloxicam  for pain and trazodone  for sleep, but it is not effective due to pain. Her medication regimen includes albuterol , estrogen, and metoprolol .  She has spinal cord compression, causing her legs to occasionally give way, as during her recent fall. No shortness of breath, chest  pain, or rapid heart beating as long as she takes her medication. Normal bowel movements.  She denies any substance use except for marijuana, last used a week ago.     07/01/2024    2:09 PM 06/01/2024    3:57 PM 04/14/2024   11:36 AM  PHQ9 SCORE ONLY  PHQ-9 Total Score 4 6  0      Data saved with a previous flowsheet row definition        07/01/2024    2:09 PM 06/01/2024    3:57 PM 04/14/2024   11:36 AM  Depression screen PHQ 2/9  Decreased Interest 0 0 0  Down, Depressed, Hopeless 1 0 0  PHQ - 2 Score 1 0 0  Altered sleeping 0 1 0  Tired, decreased energy 1 2 0  Change in appetite 0 1 0  Feeling bad or failure about yourself  0 0 0  Trouble concentrating 2 2 0  Moving slowly or fidgety/restless 0 0 0  Suicidal thoughts 0 0 0  PHQ-9 Score 4  6  0   Difficult doing work/chores Somewhat difficult Somewhat difficult Not difficult at all     Data saved with a previous flowsheet row definition      07/01/2024    2:09 PM 06/01/2024    3:57 PM 07/28/2023    2:47 PM 03/06/2023   11:06 AM  GAD 7 : Generalized Anxiety Score  Nervous, Anxious, on Edge 0 0    Control/stop worrying 1 0    Worry too much - different things 0 0    Trouble relaxing 1 2    Restless 1  1    Easily annoyed or irritable 1 1    Afraid - awful might happen 0 0    Total GAD 7 Score 4 4    Anxiety Difficulty Somewhat difficult Somewhat difficult       Information is confidential and restricted. Go to Review Flowsheets to unlock data.    Medications: Outpatient Medications Prior to Visit  Medication Sig   albuterol  (VENTOLIN  HFA) 108 (90 Base) MCG/ACT inhaler Inhale 2 puffs into the lungs every 4 (four) hours as needed for wheezing or shortness of breath.   amLODipine  (NORVASC ) 5 MG tablet TAKE 1 TABLET (5 MG TOTAL) BY MOUTH DAILY.   estrogens , conjugated, (PREMARIN ) 1.25 MG tablet TAKE 1 TABLET (1.25 MG) BY MOUTH DAILY   meloxicam  (MOBIC ) 15 MG tablet TAKE 1 TABLET (15 MG TOTAL) BY MOUTH DAILY.   metoprolol   tartrate (LOPRESSOR ) 50 MG tablet TAKE 1 TABLET BY MOUTH TWICE A DAY   pravastatin  (PRAVACHOL ) 40 MG tablet TAKE 1 TABLET(40 MG) BY MOUTH DAILY   traZODone  (DESYREL ) 100 MG tablet TAKE 1 TABLET BY MOUTH EVERY NIGHT AT BEDTIME FOR SLEEP   [DISCONTINUED] ibuprofen  (ADVIL ) 600 MG tablet Take 1 tablet (600 mg total) by mouth every 8 (eight) hours as needed.   [DISCONTINUED] levocetirizine (XYZAL ) 5 MG tablet Take 1 tablet (5 mg total) by mouth every evening.   No facility-administered medications prior to visit.    Review of Systems All negative Except see HPI       Objective    BP 106/67   Pulse (!) 52   Resp 16   Ht 5' 7 (1.702 m)   Wt 150 lb 4.8 oz (68.2 kg)   SpO2 97%   BMI 23.54 kg/m     Physical Exam Vitals reviewed.  Constitutional:      General: She is not in acute distress.    Appearance: Normal appearance. She is well-developed. She is not diaphoretic.  HENT:     Head: Normocephalic and atraumatic.  Eyes:     General: No scleral icterus.    Conjunctiva/sclera: Conjunctivae normal.  Neck:     Thyroid : No thyromegaly.  Cardiovascular:     Rate and Rhythm: Normal rate and regular rhythm.     Pulses: Normal pulses.     Heart sounds: Normal heart sounds. No murmur heard. Pulmonary:     Effort: Pulmonary effort is normal. No respiratory distress.     Breath sounds: Normal breath sounds. No wheezing, rhonchi or rales.  Musculoskeletal:     Cervical back: Neck supple.     Right lower leg: No edema.     Left lower leg: No edema.  Lymphadenopathy:     Cervical: No cervical adenopathy.  Skin:    General: Skin is warm and dry.     Findings: No rash.  Neurological:     Mental Status: She is alert and oriented to person, place, and time. Mental status is at baseline.  Psychiatric:        Mood and Affect: Mood normal.        Behavior: Behavior normal.      No results found for any visits on 09/10/24.      Assessment & Plan Preoperative  Evaluation Preoperative evaluation for surgery on November 26th. Anxiety and depression well-managed. Sober except for marijuana use a week ago. Neck pain and hardware failure require surgery. EKG needed due to heart issues.  - Ordered blood work for lipid levels and other parameters. - Referred  to cardiology for evaluation and echocardiogram. - Review all medications for surgical compatibility.  Neck fusion surgery in a patient with a history of mitral regurgitation, tricuspid regurgitation, hypertension, hyperlipidemia, anxiety, depression, polysubstance abuse history, and occasional cannabis use, who is on pravastatin , trazodone  100 mg, metoprolol , estrogen, amlodipine , and albuterol , requires careful preoperative cardiovascular risk assessment and perioperative management due to the potential for significant complications.  Cardiovascular Risk: Patients with valvular heart disease, such as mitral and tricuspid regurgitation, are at increased risk for perioperative complications. A thorough cardiovascular evaluation, including echocardiography, is essential.  Hypertension Management: Continue antihypertensive medications like metoprolol  and amlodipine  to maintain blood pressure within 20% of baseline.  Psychiatric Medications: Trazodone , used for depression and sleep, can cause cardiac arrhythmias, including QT prolongation, which necessitates careful monitoring.  Cannabis Use: Preoperative cannabis use is associated with higher rates of complications, including infection, dysphagia, postprocedural pain, and increased opioid use.  Polysubstance Abuse: A history of substance abuse, including occasional cannabis use, may complicate anesthesia and pain management, requiring a tailored approach.   Mitral and tricuspid regurgitation Mitral and tricuspid valve regurgitation. Last echocardiogram in 2020 showed functional valves. EKG needed preoperatively. Cardiology referral for further evaluation and  echocardiogram necessary. - Referred to cardiology for evaluation and echocardiogram.  Hypertension Chronic and stable Continue current regimen: amlodipine   5, metoprolol  50 Continue low sodium diet and regular exercise Will follow-up  Hyperlipidemia Chronic and previously unstable Continue current regimen:pravastatin  40mg  Continue low cholesterol diet and daily exercise Blood work needed to reassess lipid levels preoperatively. - Ordered blood work to assess lipid levels.  Bipolar/Depression and anxiety disorder/insomnia Chronic, stable    07/01/2024    2:09 PM 06/01/2024    3:57 PM 04/14/2024   11:36 AM  PHQ9 SCORE ONLY  PHQ-9 Total Score 4 6  0      Data saved with a previous flowsheet row definition      07/01/2024    2:09 PM 06/01/2024    3:57 PM 07/28/2023    2:47 PM 03/06/2023   11:06 AM  GAD 7 : Generalized Anxiety Score  Nervous, Anxious, on Edge 0 0    Control/stop worrying 1 0    Worry too much - different things 0 0    Trouble relaxing 1 2    Restless 1 1    Easily annoyed or irritable 1 1    Afraid - awful might happen 0 0    Total GAD 7 Score 4 4    Anxiety Difficulty Somewhat difficult Somewhat difficult       Information is confidential and restricted. Go to Review Flowsheets to unlock data.    Depression and anxiety well-managed. Anxiety related to upcoming surgery present but not severe. 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 Recent fall exacerbated pain and hardware issues. Surgery planned to address hardware failure and prevent complications. - Proceed with planned surgery to address cervical hardware failure. Continue symptomatic treatment Will follow-up  Fatigue Chronic Workup ordered See below Likely related to chronic neck pain and poor sleep quality. Inadequate pain management contributing to  fatigue. Will follow-up   Other fatigue (Primary)  - Drug Screen 12+Alcohol+CRT, Ur - Hemoglobin A1c - Lipid panel - TSH - Comprehensive metabolic panel with GFR - CBC with Differential/Platelet - EKG 12-Lead  Mitral valve disease  - Ambulatory referral to Cardiology - EKG 12-Lead  No orders of the defined types were placed in this encounter.   No follow-ups on file.   The patient was advised to call back or seek an in-person evaluation if the symptoms worsen or if the condition fails to improve as anticipated.  I discussed the assessment and treatment plan with the patient. The patient was provided an opportunity to ask questions and all were answered. The patient agreed with the plan and demonstrated an understanding of the instructions.  I, Adaleen Hulgan, PA-C have reviewed all documentation for this visit. The documentation on 09/10/2024  for the exam, diagnosis, procedures, and orders are all accurate and complete.  Jolynn Spencer, [facility], MMS [facility] [phone] (phone) [phone] (fax)  [facility]

## 2024-09-11 LAB — TSH: TSH: 0.881 u[IU]/mL (ref 0.450–4.500)

## 2024-09-11 LAB — CBC WITH DIFFERENTIAL/PLATELET
Basophils Absolute: 0.1 x10E3/uL (ref 0.0–0.2)
Basos: 1 %
EOS (ABSOLUTE): 0.6 x10E3/uL — ABNORMAL HIGH (ref 0.0–0.4)
Eos: 6 %
Hematocrit: 41.7 % (ref 34.0–46.6)
Hemoglobin: 13.5 g/dL (ref 11.1–15.9)
Immature Grans (Abs): 0.1 x10E3/uL (ref 0.0–0.1)
Immature Granulocytes: 1 %
Lymphocytes Absolute: 3 x10E3/uL (ref 0.7–3.1)
Lymphs: 30 %
MCH: 29.8 pg (ref 26.6–33.0)
MCHC: 32.4 g/dL (ref 31.5–35.7)
MCV: 92 fL (ref 79–97)
Monocytes Absolute: 0.7 x10E3/uL (ref 0.1–0.9)
Monocytes: 7 %
Neutrophils Absolute: 5.5 x10E3/uL (ref 1.4–7.0)
Neutrophils: 55 %
Platelets: 248 x10E3/uL (ref 150–450)
RBC: 4.53 x10E6/uL (ref 3.77–5.28)
RDW: 12.6 % (ref 11.7–15.4)
WBC: 9.9 x10E3/uL (ref 3.4–10.8)

## 2024-09-11 LAB — HEMOGLOBIN A1C
Est. average glucose Bld gHb Est-mCnc: 114 mg/dL
Hgb A1c MFr Bld: 5.6 % (ref 4.8–5.6)

## 2024-09-11 LAB — COMPREHENSIVE METABOLIC PANEL WITH GFR
ALT: 13 IU/L (ref 0–32)
AST: 19 IU/L (ref 0–40)
Albumin: 4.1 g/dL (ref 3.8–4.9)
Alkaline Phosphatase: 102 IU/L (ref 49–135)
BUN/Creatinine Ratio: 19 (ref 9–23)
BUN: 16 mg/dL (ref 6–24)
Bilirubin Total: 0.2 mg/dL (ref 0.0–1.2)
CO2: 25 mmol/L (ref 20–29)
Calcium: 9.5 mg/dL (ref 8.7–10.2)
Chloride: 100 mmol/L (ref 96–106)
Creatinine, Ser: 0.86 mg/dL (ref 0.57–1.00)
Globulin, Total: 3 g/dL (ref 1.5–4.5)
Glucose: 84 mg/dL (ref 70–99)
Potassium: 4.5 mmol/L (ref 3.5–5.2)
Sodium: 138 mmol/L (ref 134–144)
Total Protein: 7.1 g/dL (ref 6.0–8.5)
eGFR: 82 mL/min/1.73 (ref >59)

## 2024-09-11 LAB — LIPID PANEL
Chol/HDL Ratio: 4.2 ratio (ref 0.0–4.4)
Cholesterol, Total: 282 mg/dL — ABNORMAL HIGH (ref 100–199)
HDL: 67 mg/dL (ref >39)
LDL Chol Calc (NIH): 183 mg/dL — ABNORMAL HIGH (ref 0–99)
Triglycerides: 173 mg/dL — ABNORMAL HIGH (ref 0–149)
VLDL Cholesterol Cal: 32 mg/dL (ref 5–40)

## 2024-09-12 ENCOUNTER — Ambulatory Visit: Payer: Self-pay | Admitting: Physician Assistant

## 2024-09-12 DIAGNOSIS — I071 Rheumatic tricuspid insufficiency: Secondary | ICD-10-CM | POA: Insufficient documentation

## 2024-09-12 DIAGNOSIS — G8929 Other chronic pain: Secondary | ICD-10-CM | POA: Insufficient documentation

## 2024-09-12 DIAGNOSIS — Z9181 History of falling: Secondary | ICD-10-CM | POA: Insufficient documentation

## 2024-09-14 LAB — SPECIMEN STATUS REPORT

## 2024-09-14 NOTE — Unmapped External Note (Signed)
 Pre-op instructions given to patient/caregiver regarding: NPO guidelines, CHG Wipes and/or Soap and SSI prevention, and DOS medications.  Preparing for Surgery pamphlet reviewed with patient/caregiver and copy provided. Discussed DOS contact and/or need for responsible caregiver 24 hours after anesthesia for outpatient procedure.  Patient/caregiver verbalized understanding.  Implants: Neck  Arm Restrictions: N/A    OSA:  No  Wears:  N/A

## 2024-09-16 ENCOUNTER — Other Ambulatory Visit: Payer: Self-pay | Admitting: Physician Assistant

## 2024-09-16 DIAGNOSIS — F5101 Primary insomnia: Secondary | ICD-10-CM

## 2024-09-19 NOTE — Progress Notes (Unsigned)
  Cardiology Office Note   Date:  09/21/2024  ID:  Misty Robbins, Misty Robbins Mar 19, 1971, MRN [id number] PCP: Dineen Channel, PA-C  [facility] Providers Cardiologist:  Caron Poser, MD     History of Present Illness KYLIA Robbins is a 53 y.o. female PMH HTN, HLD, PSVT who presents for perioperative risk assessment for an upcoming spine surgery.  Patient reports she is overall feeling well.  She denies any ongoing chest pain or dyspnea.  She has palpitations which are well-controlled with low-dose metoprolol .  She notes a history of statin intolerance.  She is able to perform greater than 4 METS without issue.  Last LDL 183 08/2024.  Relevant CVD History -TTE OSH 02/2019 normal biventricular function, mild MR, mild TR   ROS: Pt denies any chest discomfort, jaw pain, arm pain, palpitations, syncope, presyncope, orthopnea, PND, or LE edema.  Studies Reviewed I have independently reviewed the patient's ECG, previous cardiac studies, recent medical records, recent blood work.  Physical Exam VS:  BP 116/80 (BP Location: Left Arm, Patient Position: Sitting, Cuff Size: Normal)   Pulse (!) 56   Ht 5' 7 (1.702 m)   Wt 154 lb (69.9 kg)   SpO2 98%   BMI 24.12 kg/m        Wt Readings from Last 3 Encounters:  09/21/24 154 lb (69.9 kg)  09/10/24 150 lb 4.8 oz (68.2 kg)  07/01/24 142 lb (64.4 kg)    GEN: No acute distress. NECK: No JVD; No carotid bruits. CARDIAC: RRR, no murmurs, rubs, gallops. RESPIRATORY:  Clear to auscultation. EXTREMITIES:  Warm and well-perfused. No edema.  ASSESSMENT AND PLAN Perioperative cardiovascular risk assessment RCRI of 0 indicating low perioperative cardiovascular risk for a intermediate risk operation.  She has no significant angina or dyspnea.  She is able to perform greater than 4 METS.  Prior echo has shown only mild MR/TR which is not hemodynamically significant.  Given these findings, she can proceed with surgery without further testing or  treatment.  HLD Statin intolerant Last LDL 183 08/2024.  Approaching FH levels.  She has tried multiple statin medications with significant side effects per her report.  She is unable to take statins.  Discussed Repatha .  She is amenable to starting this.  Mild MR Mild TR Seen on prior echo at an outside facility 2020.  She has no significant ongoing symptoms.  Unlikely to be of significant hemodynamic consequence.  We can obtain an updated echo to ensure there has not been significant progression.  This does not need to stand in the way of her surgery.        Dispo: RTC 1 year with repeat echo  Signed, Caron Poser, MD

## 2024-09-21 ENCOUNTER — Telehealth: Payer: Self-pay

## 2024-09-21 ENCOUNTER — Other Ambulatory Visit (HOSPITAL_COMMUNITY): Payer: Self-pay

## 2024-09-21 ENCOUNTER — Telehealth: Payer: Self-pay | Admitting: Pharmacy Technician

## 2024-09-21 ENCOUNTER — Ambulatory Visit

## 2024-09-21 VITALS — BP 116/80 | HR 56 | Ht 67.0 in | Wt 154.0 lb

## 2024-09-21 DIAGNOSIS — I340 Nonrheumatic mitral (valve) insufficiency: Secondary | ICD-10-CM

## 2024-09-21 DIAGNOSIS — Z01810 Encounter for preprocedural cardiovascular examination: Secondary | ICD-10-CM | POA: Diagnosis not present

## 2024-09-21 DIAGNOSIS — E782 Mixed hyperlipidemia: Secondary | ICD-10-CM

## 2024-09-21 DIAGNOSIS — I361 Nonrheumatic tricuspid (valve) insufficiency: Secondary | ICD-10-CM

## 2024-09-21 DIAGNOSIS — Z789 Other specified health status: Secondary | ICD-10-CM

## 2024-09-21 MED ORDER — REPATHA SURECLICK 140 MG/ML ~~LOC~~ SOAJ: 140.0000 mg | SUBCUTANEOUS | 6 refills | Status: AC

## 2024-09-21 NOTE — Telephone Encounter (Signed)
 Pharmacy Patient Advocate Encounter  Received notification from [facility] that Prior Authorization for repatha  has been APPROVED from 09/21/24 to 03/21/25   PA #/Case ID/Reference #: [id number]

## 2024-09-21 NOTE — Telephone Encounter (Signed)
   Pharmacy Patient Advocate Encounter   Received notification from Pt Calls Messages that prior authorization for REPATHA  is required/requested.   Insurance verification completed.   The patient is insured through [employer].   Per test claim: PA required; PA submitted to above mentioned insurance via Latent Key/confirmation #/EOC [facility] Status is pending

## 2024-09-21 NOTE — Patient Instructions (Signed)
 Medication Instructions:  Your physician recommends the following medication changes.    START TAKING: Repatha     Lab Work: No labs ordered today    Testing/Procedures: Your physician has requested that you have an echocardiogram. Echocardiography is a painless test that uses sound waves to create images of your heart. It provides your doctor with information about the size and shape of your heart and how well your heart's chambers and valves are working.   You may receive an ultrasound enhancing agent through an IV if needed to better visualize your heart during the echo. This procedure takes approximately one hour.  There are no restrictions for this procedure.  This will take place at [address] [facility]) [address], [place] [address]  Please note: We ask at that you not bring children with you during ultrasound (echo/ vascular) testing. Due to room size and safety concerns, children are not allowed in the ultrasound rooms during exams. Our front office staff cannot provide observation of children in our lobby area while testing is being conducted. An adult accompanying a patient to their appointment will only be allowed in the ultrasound room at the discretion of the ultrasound technician under special circumstances. We apologize for any inconvenience.   Follow-Up: At [facility], you and your health needs are our priority.  As part of our continuing mission to provide you with exceptional heart care, our providers are all part of one team.  This team includes your primary Cardiologist (physician) and Advanced Practice Providers or APPs (Physician Assistants and Nurse Practitioners) who all work together to provide you with the care you need, when you need it.  Your next appointment:   1 year(s)  Provider:   Caron Poser, MD

## 2024-09-21 NOTE — Telephone Encounter (Signed)
 Patient called stating [facility] told her they are having trouble getting her insurance to cover repatha .

## 2024-09-23 NOTE — Progress Notes (Signed)
 ORTHOPAEDIC SPINE PROGRESS NOTE  Date: 09/23/2024 11:39 AM Primary Team: Orthopedics [facility])  Orthopaedic Contact Information:  - Current orthopaedic contact: Fairy Rozelle Hides, MD - Current orthopaedic care attending: Dr. Dena - On nights (6pm-6am), weekends, and holidays, please page Orthopaedic Consult pager [phone])  Diagnosis: Cervical Myelopathy Procedure:  C4-T1 ACDF Date of surgery: 11/26  ASSESSMENT: Misty Robbins is a 53 y.o. female who is s/p aforementioned procedure  PLAN: - Weightbearing Status: as tolerated - Activity Restrictions: none - Imaging: Upright X-ray on POD1 - Diet: Regular - Antibiotics: Ancef  x23hrs  - Analgesia: MMPC, Toradol  & Decadron  x 23hrs - Anticoagulation/DVT PPX: SCD - Foley: none - Therapy: PT/OT  - Please keep dressing intact. RN may reinforce as needed  - Follow up plan: 6 week follow up scheduled  INTERVAL HISTORY: No acute events.  Pain transitioned to oral medications. Plan for DC tomorrow.  OBJECTIVE: Physical Exam BP 128/77   Pulse 58   Temp 36.6 C (97.9 F) (Oral)   Resp 17   SpO2 100%  General: NAD, AAOx3, well nourished female  CV: RRR on monitor Pulm: NWOB Location of operation: Posterior Neck. Dressing/incision small strikethrough otherwise, d/i. Gross motor/sensation intact  No sensory deficits  IMAGING: All pertinent orthopedic imaging was personally reviewed.   LABS: Lab Results  Component Value Date   WBC 9.2 09/14/2024   RBC 4.28 09/14/2024   HGB 12.9 09/14/2024   HCT 37.6 09/14/2024   Platelet 228 09/14/2024   Potassium 3.7 09/14/2024     Principal Problem:   Cervical myelopathy    (CMS-HCC)   Fairy LITTIE Hides, MD

## 2024-09-24 NOTE — Care Plan (Signed)
 [facility]  [facility] Referral Clinical Care Management phone [phone] fax [phone]  Demographics Patient Name: Misty Robbins Date of Birth: [birth year] Gender: Female [facility] Medical Record #: [id number] Billing Address: [address] [place] [address] Destination Address: Mailing Address:  [address] [place] [address]  Anticipated Discharge Date: 09/24/24 Medicare homebound criteria met? Yes Known concerns about discharge environment? No Right to Choice document & provider list(s) provided and discussed with patient/family? Yes Teachable caregiver identified: Caregiver: Extended Emergency Contact Information Primary Emergency Contact: Tantillo,JOHNNY Address: [address]          [place], [place] [address] [place] Home Phone: [phone] Mobile Phone: [phone] Relation: Spouse Additional Information: N/A Language Barrier (if yes, identify language): No  This Document is a Referral Only Additional information, including specific home care orders and instructions, will be provided in the electronic discharge summary.   Clinical Condition []  See attached medical records Admitting Diagnosis:  Cervical myelopathy    (CMS-HCC) [G95.9] Past Medical History:   has a past medical history of Arthritis (2010), Asthma (HHS-HCC) (1995), Heart valve disease (2005), Hyperlipidemia (1995), and Hypertension (1995). Past Surgical History:   has a past surgical history that includes Appendectomy (2018); Hysterectomy (2018); Spinal fusion (2011); and Other surgical history (2015, 2008, 1981).   Name of MD signing discharge orders and contact info: Vicenta GORMAN Ang Primary Care Physician: Fortunato Albertine LABOR, MD  Services Requested [x]  New Referral []  Resumption of Care  []  This is patient at risk for readmission  Physical Therapy: Please evaluate and treat. NOTE: Weightbearing:  not  applicable   Additional Order Information:   N/A  Requested Start of Care [facility]):  Within 48 hours  CCM Case Manager to electronically sign referral when complete.  Lesley JONELLE Sharps, MSW

## 2024-09-24 NOTE — Care Plan (Signed)
 Right to Choose Post-Acute Care Service Providers [facility] Care Management  Patient Patient Name: Misty Robbins  Date of Birth: 06/29/1971 [facility] Medical Record #: [id number]   Right to Choice [facility] is pleased to provide you with the attached list of post-acute care service providers who are licensed and available to provide the services you need at the zip code of your discharge destination. This list of providers was extracted from the Centers for Medicare and Medicaid Services on-line list of certified agencies or the in-network list supplied by your commercial insurer. You have the freedom to choose the provider which will provide post-discharge services to you.  When possible, [facility] will respect your goals of care, treatment preferences, and other preferences you express. [facility] does not endorse or guarantee the quality of services provided by the agencies listed. [facility] will make reasonable attempts to arrange services with the providers you identify as top choices, however we cannot guarantee service availability from any agency. If services from your choice of provider(s) are not available within timeframes required for your care, we will contact other providers on the attached list to make arrangements.    Financial Disclosure [facility] is a department of [facility].     For Questions or Concerns If you have any questions about your right to choose or the agencies on the list provided, please contact the person listed below, or the Care Management Department at [phone].

## 2024-09-24 NOTE — BH Treatment Plan (Addendum)
 Orthopedic Surgery Treatment Plan Note  The patient would benefit from a walker and meets the below criteria: A standard walker and related accessories are covered if all of the following criteria (1-3) are met: 1. The beneficiary has a mobility limitation that significantly impairs his/her ability to participate in one or more mobility-related activities of daily living (MRADL) in the home. A mobility limitation is one that: a. Prevents the beneficiary from accomplishing the MRADL entirely, or b. Places the beneficiary at reasonably determined heightened risk of morbidity or mortality secondary to the attempts to perform the MRADL, or c. Prevents the beneficiary from completing the MRADL within a reasonable time frame; and 2. The beneficiary is able to safely use the walker; and 3. The functional mobility deficit can be sufficiently resolved with use of a walker.   Mitchael Coffer, MD Orthopaedic Surgery, PGY-1

## 2024-09-24 NOTE — Care Plan (Signed)
 Shift Summary Patient ambulated 100 ft with minimal assistance and performed transfers with supervision and cueing, supporting functional goals.   Discharge documentation was completed and patient verbalized understanding of rights, with family updated and home equipment confirmed.  Wound dressing and peri-wound area remained clean, dry, and intact, and sepsis risk scores stayed low.  Patient expressed readiness to go home and was agreeable to PT activities.  Overall, patient demonstrated progress toward functional and discharge goals during the shift.   Optimal Comfort and Wellbeing: Ambulation and transfers were performed with minimal to standby assistance, and patient was agreeable to PT and expressed readiness for discharge; skin inspection revealed intact areas where visualized and dressing remained clean, dry, and intact throughout the shift.   Readiness for Transition of Care: Patient verbalized understanding of discharge rights and received all necessary documentation, with family updated and equipment available at home; patient expressed readiness to go home today.   Optimal Functional Ability: Ambulated 100 ft with minimal assistance and performed transfers with supervision and cueing, demonstrating progress toward short-term and long-term goals of returning to prior level of function.   Absence of Infection Signs and Symptoms: Sepsis risk scores remained unchanged and low throughout the shift, and wound dressing and peri-wound area stayed clean and dry.   Optimal Pain Control and Function: Neck pain was present but tailored towards tolerance during PT activities.

## 2024-09-27 ENCOUNTER — Telehealth: Payer: Self-pay

## 2024-09-27 NOTE — Transitions of Care (Post Inpatient/ED Visit) (Signed)
   09/27/2024  Name: Misty Robbins MRN: [id number] DOB: [birth year]  Today's TOC FU Call Status: Today's TOC FU Call Status:: Unsuccessful Call (1st Attempt) Unsuccessful Call (1st Attempt) Date: 09/27/24  Attempted to reach the patient regarding the most recent Inpatient/ED visit. Left a HIPAA approved voicemail message to phone number provided in demographics as preferred number.    Follow Up Plan: Additional outreach attempts will be made to reach the patient to complete the Transitions of Care (Post Inpatient/ED visit) call.   Richerd Fish, RN, BSN, CCM [facility], [facility] Management Coordinator Direct Dial: [phone]

## 2024-09-28 ENCOUNTER — Telehealth: Payer: Self-pay

## 2024-09-28 NOTE — Transitions of Care (Post Inpatient/ED Visit) (Signed)
 09/28/2024  Name: Misty Robbins MRN: [id number] DOB: August 22, 1971  Today's TOC FU Call Status: Today's TOC FU Call Status:: Successful TOC FU Call Completed TOC FU Call Complete Date: 09/28/24  Patient's Name and Date of Birth confirmed. Name, DOB  Transition Care Management Follow-up Telephone Call Date of Discharge: 09/24/24 Discharge Facility: Other (Non-[facility]) Type of Discharge: Inpatient Admission Primary Inpatient Discharge Diagnosis:: Cervical Myelopathy  Procedure:  C4-T1 ACDF How have you been since you were released from the hospital?: Worse (Neck spasm are worse with pain, hard to get comfortable) Any questions or concerns?: No  Items Reviewed: Did you receive and understand the discharge instructions provided?: Yes Medications obtained,verified, and reconciled?: Yes (Medications Reviewed) Any new allergies since your discharge?: No Dietary orders reviewed?: NA Do you have support at home?: Yes People in Home [RPT]: spouse Name of Support/Comfort Primary Source: Johnny  Medications Reviewed Today: Medications Reviewed Today     Reviewed by Eilleen Richerd GRADE, RN (Registered Nurse) on 09/28/24 at 1538  Med List Status: <None>   Medication Order Taking? Sig Documenting Provider Last Dose Status Informant  acetaminophen  (TYLENOL ) 500 MG tablet [id number] Yes Take 500 mg by mouth every 8 (eight) hours as needed. [provider]  Active   albuterol  (VENTOLIN  HFA) 108 (90 Base) MCG/ACT inhaler [id number] Yes Inhale 2 puffs into the lungs every 4 (four) hours as needed for wheezing or shortness of breath. Ostwalt, Janna, PA-C  Active   amLODipine  (NORVASC ) 5 MG tablet [id number] Yes TAKE 1 TABLET (5 MG TOTAL) BY MOUTH DAILY. Ostwalt, Janna, PA-C  Active   aspirin (ASPIRIN 81) 81 MG chewable tablet [id number] Yes Chew by mouth 2 (two) times daily. [provider]  Active   cefadroxil (DURICEF) 500 MG capsule [id number] Yes Take 500 mg by mouth 2 (two)  times daily. [provider]  Active   estrogens , conjugated, (PREMARIN ) 1.25 MG tablet [id number] Yes TAKE 1 TABLET (1.25 MG) BY MOUTH DAILY Ostwalt, Janna, PA-C  Active   Evolocumab  (REPATHA  SURECLICK) 140 MG/ML SOAJ [id number]  Inject 140 mg into the skin every 14 (fourteen) days.  Patient not taking: Reported on 09/28/2024   Argentina Clap, MD  Active   HYDROmorphone (DILAUDID) 2 MG tablet [id number] Yes Take by mouth every 12 (twelve) hours. [provider]  Active   meloxicam  (MOBIC ) 15 MG tablet [id number] Yes TAKE 1 TABLET (15 MG TOTAL) BY MOUTH DAILY. Ostwalt, Janna, PA-C  Active   methocarbamol (ROBAXIN) 500 MG tablet [id number] Yes Take 750 mg by mouth 3 (three) times daily. Started on 09/24/24  Patient taking differently: Take 750 mg by mouth 3 (three) times daily as needed for muscle spasms. Started on 09/24/24   [provider]  Active   metoprolol  tartrate (LOPRESSOR ) 50 MG tablet [id number]  TAKE 1 TABLET BY MOUTH TWICE A DAY  Patient taking differently: Take 25 mg by mouth 2 (two) times daily.   Ostwalt, Janna, PA-C  Active   oxycodone  (OXY-IR) 5 MG capsule [id number] Yes Take 5 mg by mouth every 4 (four) hours as needed for pain. [provider]  Active   pravastatin  (PRAVACHOL ) 40 MG tablet [id number]  TAKE 1 TABLET(40 MG) BY MOUTH DAILY  Patient not taking: Reported on 09/28/2024   Kristina Tinnie POUR, PA-C  Active   pregabalin (LYRICA) 75 MG capsule [id number] Yes Take 75 mg by mouth. [provider]  Active   senna (SENOKOT) 8.6 MG TABS tablet [id number] Yes Take 1 tablet by mouth.  [provider]  Active   traZODone  (DESYREL ) 100 MG tablet [id number] Yes TAKE 1 TABLET BY MOUTH EVERY NIGHT AT BEDTIME FOR SLEEP Ostwalt, Janna, PA-C  Active           **Patient states she is taking Miralax daily  Home Care and Equipment/Supplies: Were Home Health Services Ordered?: NA Any new equipment or medical supplies ordered?:  NA  Functional Questionnaire: Do you need assistance with bathing/showering or dressing?: Yes (set up and husband helps with tops) Do you need assistance with meal preparation?: Yes Do you need assistance with eating?: Yes (getting food set up) Do you have difficulty maintaining continence: No Do you need assistance with getting out of bed/getting out of a chair/moving?: Yes Do you have difficulty managing or taking your medications?: No  Follow up appointments reviewed: PCP Follow-up appointment confirmed?: Yes Date of PCP follow-up appointment?: 10/12/24 (This is not a hospital follow up and patient does NOT want to change appointment times) Follow-up Provider: Jolynn Householder, [facility] Specialist [facility] Follow-up appointment confirmed?: Yes Date of Specialist follow-up appointment?: 11/02/24 Follow-Up Specialty Provider:: [facility] Do you need transportation to your follow-up appointment?: No Do you understand care options if your condition(s) worsen?: Yes-patient verbalized understanding (I have contacted the [facility] neuro spine about my paiin)  Education for self-mgmt of post op cerival follow up provided during this outreach regarding: -s/s of worsening condition and when to seek medical attention -importance of completing all post discharge hospital hospital follow up appts -adherence to med regimen [facility] TOC 30-day program enrollment reviewed and discussed with pt/caregiver. They have declined enrollment in 30 day TOC program due to:   Richerd Fish, RN, BSN, CCM [facility], [facility] Management Coordinator Direct Dial: [phone]

## 2024-10-14 ENCOUNTER — Ambulatory Visit: Admitting: Physician Assistant

## 2024-11-04 ENCOUNTER — Other Ambulatory Visit: Payer: Self-pay | Admitting: Physician Assistant

## 2024-11-04 ENCOUNTER — Encounter: Payer: Self-pay | Admitting: Physician Assistant

## 2024-11-04 ENCOUNTER — Ambulatory Visit: Admitting: Physician Assistant

## 2024-11-04 VITALS — BP 144/83 | HR 59 | Resp 16 | Wt 155.8 lb

## 2024-11-04 DIAGNOSIS — K219 Gastro-esophageal reflux disease without esophagitis: Secondary | ICD-10-CM

## 2024-11-04 DIAGNOSIS — E7800 Pure hypercholesterolemia, unspecified: Secondary | ICD-10-CM | POA: Diagnosis not present

## 2024-11-04 DIAGNOSIS — Z780 Asymptomatic menopausal state: Secondary | ICD-10-CM

## 2024-11-04 DIAGNOSIS — R52 Pain, unspecified: Secondary | ICD-10-CM | POA: Diagnosis not present

## 2024-11-04 DIAGNOSIS — I10 Essential (primary) hypertension: Secondary | ICD-10-CM | POA: Diagnosis not present

## 2024-11-04 NOTE — Progress Notes (Unsigned)
 " Established patient visit  Patient: Misty Robbins   DOB: September 21, 1971   54 y.o. Female  MRN: [id number] Visit Date: 11/04/2024  Today's healthcare provider: Jolynn Spencer, PA-C   Chief Complaint  Patient presents with   Medical Management of Chronic Issues   Referral    To the pain clinic   Medication Refill    Lyrica, Premarin    Subjective     HPI     Referral    Additional comments: To the pain clinic        Medication Refill    Additional comments: Lyrica, Premarin       Last edited by Rosas, Joseline E, CMA on 11/04/2024  1:01 PM.       Discussed the use of AI scribe software for clinical note transcription with the patient, who gave verbal consent to proceed.  History of Present Illness Misty Robbins is a 54 year old female who presents for pain management and medication review.  She reports severe worsening of chronic pain after a recent fall while carrying groceries, which caused her leg to give way and led to a broken titanium plate and spinal cord compression. She recently had spinal surgery with staple removal a few days ago. The post surgery pain is significant and ongoing.   She is worried that injections near the recent surgical site may be unsafe and prefers a pain management approach focused on medications rather than injections, despite a referral to a pain clinic.  She has persistent shoulder pain after a fall while opening a new freezer. An orthopedic injection at [facility] did not relieve symptoms. She has been unable to use her arm for six weeks and has been mostly bedridden, with pain radiating from her shoulder and collarbone down her back and causing headaches, which limits daily activities.  She is taking Premarin  at the maximum dose for severe hot flashes. Any dose reduction or stopping Premarin  causes extreme hot flashes, and other medications have not helped.  She reports her blood pressure has been pretty good and denies vision changes,  chest pain, shortness of breath, or palpitations. She takes chewable aspirin twice daily and recently changed from pravastatin  to Repatha  injections for cholesterol.  She has not started formal physical therapy because of a negative interaction with a home health provider. She is performing her own stretches at home and plans to start outpatient physical therapy.       07/01/2024    2:09 PM 06/01/2024    3:57 PM 04/14/2024   11:36 AM  Depression screen PHQ 2/9  Decreased Interest 0 0 0  Down, Depressed, Hopeless 1 0 0  PHQ - 2 Score 1 0 0  Altered sleeping 0 1 0  Tired, decreased energy 1 2 0  Change in appetite 0 1 0  Feeling bad or failure about yourself  0 0 0  Trouble concentrating 2 2 0  Moving slowly or fidgety/restless 0 0 0  Suicidal thoughts 0 0 0  PHQ-9 Score 4  6  0   Difficult doing work/chores Somewhat difficult Somewhat difficult Not difficult at all     Data saved with a previous flowsheet row definition      07/01/2024    2:09 PM 06/01/2024    3:57 PM 07/28/2023    2:47 PM 03/06/2023   11:06 AM  GAD 7 : Generalized Anxiety Score  Nervous, Anxious, on Edge 0 0    Control/stop worrying 1 0    Worry too  much - different things 0 0    Trouble relaxing 1 2    Restless 1 1    Easily annoyed or irritable 1 1    Afraid - awful might happen 0 0    Total GAD 7 Score 4 4    Anxiety Difficulty Somewhat difficult Somewhat difficult       Information is confidential and restricted. Go to Review Flowsheets to unlock data.    Medications: Show/hide medication list[1]  Review of Systems All negative Except see HPI   {Insert previous labs (optional):23779} {See past labs  Heme  Chem  Endocrine  Serology  Results Review (optional):1}   Objective    BP (!) 144/83 (BP Location: Left Arm, Patient Position: Sitting, Cuff Size: Large)   Pulse (!) 59   Resp 16   Wt 155 lb 12.8 oz (70.7 kg)   SpO2 96%   BMI 24.40 kg/m  {Insert last BP/Wt (optional):23777}{See vitals  history (optional):1}   Physical Exam Vitals reviewed.  Constitutional:      General: She is not in acute distress.    Appearance: Normal appearance. She is well-developed. She is not diaphoretic.  HENT:     Head: Normocephalic and atraumatic.  Eyes:     General: No scleral icterus.    Conjunctiva/sclera: Conjunctivae normal.  Neck:     Thyroid : No thyromegaly.  Cardiovascular:     Rate and Rhythm: Normal rate and regular rhythm.     Pulses: Normal pulses.     Heart sounds: Normal heart sounds. No murmur heard. Pulmonary:     Effort: Pulmonary effort is normal. No respiratory distress.     Breath sounds: Normal breath sounds. No wheezing, rhonchi or rales.  Musculoskeletal:     Cervical back: Neck supple.     Right lower leg: No edema.     Left lower leg: No edema.  Lymphadenopathy:     Cervical: No cervical adenopathy.  Skin:    General: Skin is warm and dry.     Findings: No rash.  Neurological:     Mental Status: She is alert and oriented to person, place, and time. Mental status is at baseline.  Psychiatric:        Mood and Affect: Mood normal.        Behavior: Behavior normal.      No results found for any visits on 11/04/24.      Assessment & Plan Chronic pain management following spine surgery Chronic pain post-surgery exacerbated by fall. Current pain management inadequate. Prefers medication management over procedures. - Referred to [facility] pain clinic or other pain clinic for medication management. - Specified preference for medication management in referral. - Will contact scheduler to check pain clinics specializing in medication management. Referral to pain clinic was placed to [facility] pain clinic as well by [facility] ortho surgery  Right shoulder pain Persistent pain post-fall despite previous injection.  - Consider referral to a general orthopedic specialist for shoulder management if pt agrees.  Essential hypertension Chronic and unstable Hypertension  poorly controlled, likely due to pain and stress. Blood pressure management critical due to thrombotic risk with hormone therapy. - Continue to monitor blood pressure regularly. - Continue current antihypertensive regimen. Continue lifestyle modifications We will follow-up  Menopausal symptoms Severe symptoms managed with Premarin . Concerns about thrombotic risks  Difficulty with oral absorption. - Consulted with in-clinic pharmacist regarding transition from Premarin  to alternative therapy. Was advised to  - Consider tapering Premarin  dose and transitioning to a  non-hormonal medication. - Prescribe medication for one month and reassess. Yes, hot flashes can still bother a 54 year old female after a hysterectomy done 10 years ago. Management options include hormone therapy (HT) and non-hormonal treatments.  Hormone therapy (HT) with estrogen alone is effective for women without a uterus and can be considered if there are no contraindications.[1-3]  Non-hormonal treatments such as selective serotonin reuptake inhibitors (SSRIs), serotonin-norepinephrine reuptake inhibitors (SNRIs), gabapentin , or clonidine can be used if HT is not suitable.[1-3]  Cognitive-behavioral therapy (CBT) may also be beneficial for managing vasomotor symptoms (VMS).[2]  Regular reassessment of the need for continued HT is recommended, and the lowest effective dose should be used for the shortest duration necessary.[1-3]  Transdermal routes of HT are preferred for women with a higher risk of thromboembolic events.[4]  Acquired conditions like advanced age, obesity, cancer, and immobility also contribute to a higher risk.[3]  Pregnancy and postpartum periods are associated with increased thromboembolic risk due to physiological changes.[2]  Certain medications, including Janus kinase (JAK) inhibitors (e.g., baricitinib, tofacitinib, upadacitinib) and combined oral contraceptives, are linked to higher thromboembolic  risk.[4-7]  Cancer treatments, particularly those involving thalidomide, are associated with an increased risk of thromboembolic events.[8] Hypercholesterolemia Chronic and stable Managed with Repatha  injections. Off pravastatin , Repatha  effective. The 10-year ASCVD risk score (Arnett DK, et al., 2019) is: 5.4% - Continue Repatha  injections. Continue low-cholesterol diet and regular exercise Will follow-up   No orders of the defined types were placed in this encounter.   No follow-ups on file.   The patient was advised to call back or seek an in-person evaluation if the symptoms worsen or if the condition fails to improve as anticipated.  I discussed the assessment and treatment plan with the patient. The patient was provided an opportunity to ask questions and all were answered. The patient agreed with the plan and demonstrated an understanding of the instructions.  I, Tehya Leath, PA-C have reviewed all documentation for this visit. The documentation on 11/04/2024  for the exam, diagnosis, procedures, and orders are all accurate and complete.  Jolynn Spencer, [facility], MMS [facility] [phone] (phone) [phone] (fax)  [facility]     [1]  Outpatient Medications Prior to Visit  Medication Sig   acetaminophen  (TYLENOL ) 500 MG tablet Take 500 mg by mouth every 8 (eight) hours as needed.   albuterol  (VENTOLIN  HFA) 108 (90 Base) MCG/ACT inhaler Inhale 2 puffs into the lungs every 4 (four) hours as needed for wheezing or shortness of breath.   amLODipine  (NORVASC ) 5 MG tablet TAKE 1 TABLET (5 MG TOTAL) BY MOUTH DAILY.   aspirin (ASPIRIN 81) 81 MG chewable tablet Chew by mouth 2 (two) times daily.   cefadroxil (DURICEF) 500 MG capsule Take 500 mg by mouth 2 (two) times daily.   estrogens , conjugated, (PREMARIN ) 1.25 MG tablet TAKE 1 TABLET (1.25 MG) BY MOUTH DAILY   Evolocumab  (REPATHA  SURECLICK) 140 MG/ML SOAJ Inject 140 mg into the skin every 14  (fourteen) days.   HYDROmorphone (DILAUDID) 2 MG tablet Take by mouth every 12 (twelve) hours.   meloxicam  (MOBIC ) 15 MG tablet TAKE 1 TABLET (15 MG TOTAL) BY MOUTH DAILY.   methocarbamol (ROBAXIN) 500 MG tablet Take 750 mg by mouth 3 (three) times daily. Started on 09/24/24 (Patient taking differently: Take 750 mg by mouth 3 (three) times daily as needed for muscle spasms. Started on 09/24/24)   metoprolol  tartrate (LOPRESSOR ) 50 MG tablet TAKE 1 TABLET BY MOUTH TWICE A DAY (Patient  taking differently: Take 25 mg by mouth 2 (two) times daily.)   oxycodone  (OXY-IR) 5 MG capsule Take 5 mg by mouth every 4 (four) hours as needed for pain.   pregabalin (LYRICA) 75 MG capsule Take 75 mg by mouth.   senna (SENOKOT) 8.6 MG TABS tablet Take 1 tablet by mouth.   traZODone  (DESYREL ) 100 MG tablet TAKE 1 TABLET BY MOUTH EVERY NIGHT AT BEDTIME FOR SLEEP   polyethylene glycol (MIRALAX / GLYCOLAX) 17 g packet Take 17 g by mouth daily.   pravastatin  (PRAVACHOL ) 40 MG tablet TAKE 1 TABLET(40 MG) BY MOUTH DAILY (Patient not taking: Reported on 09/28/2024)   No facility-administered medications prior to visit.   "

## 2024-11-08 ENCOUNTER — Other Ambulatory Visit: Payer: Self-pay | Admitting: Physician Assistant

## 2024-11-08 DIAGNOSIS — Z780 Asymptomatic menopausal state: Secondary | ICD-10-CM

## 2024-11-08 MED ORDER — ESTRADIOL 0.025 MG/24HR TD PTWK: MEDICATED_PATCH | TRANSDERMAL | 12 refills | Status: DC

## 2024-11-09 ENCOUNTER — Ambulatory Visit: Attending: Orthopaedic Surgery of the Spine

## 2024-11-09 ENCOUNTER — Encounter: Payer: Self-pay | Admitting: Physician Assistant

## 2024-11-09 DIAGNOSIS — M542 Cervicalgia: Secondary | ICD-10-CM | POA: Diagnosis present

## 2024-11-09 DIAGNOSIS — Z780 Asymptomatic menopausal state: Secondary | ICD-10-CM

## 2024-11-09 DIAGNOSIS — R29898 Other symptoms and signs involving the musculoskeletal system: Secondary | ICD-10-CM | POA: Insufficient documentation

## 2024-11-09 NOTE — Therapy (Signed)
 " OUTPATIENT PHYSICAL THERAPY CERVICAL EVALUATION   Patient Name: Misty Robbins MRN: [id number] DOB:1971-03-16, 54 y.o., female Today's Date: 11/09/2024  END OF SESSION:  PT End of Session - 11/09/24 1356     Visit Number 1    Number of Visits 17    Date for Recertification  01/04/25    PT Start Time 1348    PT Stop Time 1430    PT Time Calculation (min) 42 min    Activity Tolerance Patient tolerated treatment well          Past Medical History:  Diagnosis Date   Allergy    Anxiety    Arthritis    Asthma    Bipolar 1 disorder (HCC)    Chest pain    COPD (chronic obstructive pulmonary disease) (HCC)    Depression    GERD (gastroesophageal reflux disease)    History of kidney stones    Hyperlipidemia    Hypertension    Insomnia    Mitral valve regurgitation    Osteoporosis    Peripheral neuropathy    Pyelonephritis    Tricuspid valve regurgitation    Past Surgical History:  Procedure Laterality Date   ABDOMINAL HYSTERECTOMY  2000   APPENDECTOMY     BACK SURGERY     CERVICAL FUSION  2011,2012   c2-7   COLONOSCOPY WITH PROPOFOL  N/A 04/13/2018   Procedure: COLONOSCOPY WITH PROPOFOL ;  Surgeon: Misty Carmine, MD;  Location: [facility];  Service: Endoscopy;  Laterality: N/A;   ESOPHAGOGASTRODUODENOSCOPY (EGD) WITH PROPOFOL  N/A 04/13/2018   Procedure: ESOPHAGOGASTRODUODENOSCOPY (EGD) WITH PROPOFOL ;  Surgeon: Misty Carmine, MD;  Location: [facility];  Service: Endoscopy;  Laterality: N/A;   LAPAROSCOPY N/A 05/25/2018   Procedure: LAPAROSCOPY DIAGNOSTIC;  Surgeon: Misty Misty PARAS, MD;  Location: [facility];  Service: Gynecology;  Laterality: N/A;   LYSIS OF ADHESION N/A 05/25/2018   Procedure: LYSIS OF ADHESION;  Surgeon: Misty Robbins, Misty PARAS, MD;  Location: [facility];  Service: Gynecology;  Laterality: N/A;   POLYPECTOMY  04/13/2018   Procedure: POLYPECTOMY;  Surgeon: Misty Carmine, MD;  Location: [facility];  Service: Endoscopy;;   SKIN  GRAFT  1981   from right thigh to bilateral ankles and right knee   SPINE SURGERY     TONSILLECTOMY  2013   TRACHELECTOMY N/A 05/25/2018   Procedure: TRACHELECTOMY;  Surgeon: Misty Robbins, Misty PARAS, MD;  Location: [facility];  Service: Gynecology;  Laterality: N/A;   TUBAL LIGATION     Patient Active Problem List   Diagnosis Date Noted   History of recent fall 09/12/2024   Tricuspid valve insufficiency 09/12/2024   Chronic neck and back pain 09/12/2024   Abnormal CT scan, cervical spine 07/01/2024   Neck pain with history of cervical spinal surgery 07/01/2024   Neck pain 07/01/2024   Primary insomnia 12/04/2022   Elevated cholesterol 12/04/2022   Menopause 12/04/2022   Bipolar and related disorder (HCC) 12/04/2022   Midline low back pain with left-sided sciatica 12/04/2022   Postsurgical menopause 12/04/2022   Primary osteoarthritis involving multiple joints 11/23/2019   Acute midline low back pain without sciatica 04/28/2019   Dysuria 03/29/2019   Cervical motion tenderness 01/15/2019   Essential hypertension 01/15/2019   Mitral valve disorder 01/15/2019   Gastroesophageal reflux disease without esophagitis 01/15/2019   Encounter for general adult medical examination with abnormal findings 01/15/2019   Postoperative state 05/25/2018   Diarrhea    Benign neoplasm of cecum    Loss of weight  Chronic gastritis     PCP: Ostwalt, Janna, PA-C   REFERRING PROVIDER: Dena Berg, MD   REFERRING DIAG: G95.9 (ICD-10-CM) - Disease of spinal cord, unspecified   THERAPY DIAG:  Painful cervical ROM - Plan: PT plan of care cert/re-cert  Cervical pain - Plan: PT plan of care cert/re-cert  Decreased ROM of neck - Plan: PT plan of care cert/re-cert  Rationale for Evaluation and Treatment: Rehabilitation  ONSET DATE: 09/22/24  SUBJECTIVE:                                                                                                                                                                                                          SUBJECTIVE STATEMENT:  Pt reports that she had spinal fusion the day before Thanksgiving and was supposed to have PT services at home once being discharged, but the person was very rude and pt requested that they not come at all due to how rude they were.  Pt notes that she is seeking therapy for cervical ROM and to assist in reducing the swelling that she feels in the neck currently.    Pt has had 3 total surgeries in the neck, with the most recent being due to a fall.  Pt reports that she messed up the screws and broke the plate that was in the neck.  Pt reports the first 2 surgeries they went with an anterior approach, and the last was posterior due to the injury and needing extensive work being done.  C2-T1 fusion performed in the most recent surgery.  Hand dominance: Right  PERTINENT HISTORY:   Per MD visit on 11/04/24: Misty Robbins is a 54 year old female who presents for pain management and medication review.   She reports severe worsening of chronic pain after a recent fall while carrying groceries, which caused her leg to give way and led to a broken titanium plate and spinal cord compression. She recently had spinal surgery with staple removal a few days ago. The post surgery pain is significant and ongoing.   She is worried that injections near the recent surgical site may be unsafe and prefers a pain management approach focused on medications rather than injections, despite a referral to a pain clinic.   She has persistent shoulder pain after a fall while opening a new freezer. An orthopedic injection at [facility] did not relieve symptoms. She has been unable to use her arm for six weeks and has been mostly bedridden, with pain radiating from her shoulder and collarbone down her back and  causing headaches, which limits daily activities.   She has not started formal physical therapy because of a negative interaction with  a home health provider. She is performing her own stretches at home and plans to start outpatient physical therapy.  PAIN:  Are you having pain? Yes: NPRS scale: 4/10 Pain location: Occipital region and around C5, along with some mid thoracic pain as well. Pain description: Dull achey, throbbing Aggravating factors: movement and being upright Relieving factors: laying head down  PRECAUTIONS: Cervical;  Pt has been cleared to do activity, but advised to perform as tolerated.  Pt does have a broke plate and a fractured screw on the anterior portion of the neck that will continue to be monitored by MD moving forward.  RED FLAGS: None    WEIGHT BEARING RESTRICTIONS: No  FALLS:  Has patient fallen in last 6 months? Yes. 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  Pt unable to hold a gallon of tea and would like to be able to drive with rotation.  NEXT MD VISIT: July, 2026  OBJECTIVE:  Note: Objective measures were completed at Evaluation unless otherwise noted.  DIAGNOSTIC FINDINGS:   X-Ray Cervical Spine  Impression: 1. Posterior spinal fusion spanning C2-T1.  No hardware complication. 2.  Similar fracture of the ACDF plate at the level of C6 with slight anterior separation from the adjacent vertebral body at the level of the C5 corpectomy.  The left C4 vertebral body screw remains fracture and changed in position.   PATIENT SURVEYS:  NDI:  NECK DISABILITY INDEX  Date: 11/09/2024 Score  Pain intensity 1 = The pain is very mild at the moment  2. Personal care (washing, dressing, etc.) 1 =  I can look after myself normally but it causes extra pain  3.  Lifting 3 = Pain prevents me from lifting heavy weights but I can manage light to medium   weights if they are conveniently positioned  4. Reading 4 =  I can hardly read at all because of severe pain in my neck  5. Headaches 3 = I have moderate headaches, which come frequently  6. Concentration 3 = I have a lot of difficulty in concentrating when I want to  7. Work 2 = I can do most of my usual work, but no more  8. Driving 5 = I can't drive my car at all  9. Sleeping 3 =  My sleep is moderately disturbed (2-3 hrs sleepless)  10. Recreation 4 =  I can hardly do any recreation activities because of pain in my neck  Total 29/50   Minimum Detectable Change (90% confidence): 5 points or 10% points  COGNITION: Overall cognitive status: Within functional limits for tasks assessed  SENSATION: WFL  POSTURE: decreased thoracic kyphosis  PALPATION: Pt with significant tightness in the L paraspinals of the cervical region, along with L UT tightness noted.  Pt also having some tightness with TP's note din the periscapular region.     CERVICAL ROM:   Active ROM A/PROM (deg) eval  Flexion 14  Extension 25  Right lateral flexion 9  Left lateral flexion 14  Right rotation 20  Left rotation 18   (Blank rows = not tested)     TREATMENT DATE: 11/09/2024  Evaluation  Self-Care/Home Management:  Pt given education on current POC, the findings of the evaluation, and the ways in which skilled therapy can address the current deficits in the cervical spine and musculature strength in the UE's.  Pt instructed on how this can improve their overall function and maintain/improve their overall quality of life.     Manual:  Supine STM to cervical region to increase extensibility of the paraspinals Supine suboccipital release technique to decrease cervicalgia Supine UT/Levator stretch, 30 sec bouts to increase tissue extensibility of the cervical region    PATIENT EDUCATION:  Education  details: Pt educated on role of PT and services provided during current POC, along with prognosis and information about the clinic.  Pt also educated on requesting restrictions from MD in regards to therapy due to the plate being broken and the fractured screw.  Pt also given educated as noted above regarding desensitization technique for the neck and shoulder region to calm the area down. Person educated: Patient Education method: Explanation, Demonstration, Tactile cues, and Verbal cues Education comprehension: verbalized understanding and returned demonstration  HOME EXERCISE PROGRAM:  To be given at the next visit when cleared by MD.  ASSESSMENT:  CLINICAL IMPRESSION: Patient is a 54 y.o. female who was seen today for physical therapy evaluation and treatment for neck pain and reduced ROM of the cervical spine s/p C2-T1 fusion following a fall.  Pt has broken hardware and a fractured screw in the anterior portion of the neck and needs to be monitored.  Pt demonstrates a reduction in overall ROM of the cervical spine due to the surgical procedure she had, and will likely not obtain full ROM of the cervical spine.  Pt also is noting increased pain and this is likely due to a lack of mobility in the spine and needing to stretch the soft tissues surrounding the region.  Pt self-reports it is limiting her daily tasks, most importantly her ability to drive safely.  Pt is hopeful that therapy can assist in providing relief while also improving overall ROM of the shoulder.  Pt will benefit from skilled therapy to address tolerance, ROM, pain, and strength impairments necessary for improvement in quality of life.  Pt. demonstrates understanding of this plan of care and agrees with this plan.   OBJECTIVE IMPAIRMENTS: decreased activity tolerance, decreased ROM, decreased strength, hypomobility, impaired UE functional use, postural dysfunction, and pain.   ACTIVITY LIMITATIONS: carrying, lifting, bending,  and hygiene/grooming  PARTICIPATION LIMITATIONS: meal prep, cleaning, laundry, driving, shopping, community activity, occupation, and yard work  PERSONAL FACTORS: Past/current experiences, Time since onset of injury/illness/exacerbation, and 3+ comorbidities: anxiety, arthritis, COPD, depression, HTN, osteoporsis are also affecting patient's functional outcome.   REHAB POTENTIAL: Fair pt may not be able to make large improvements in the overall range of her cervical spine due to the length of her spinal fusion being from C2-T1  CLINICAL DECISION MAKING: Evolving/moderate complexity  EVALUATION COMPLEXITY: Moderate   GOALS: Goals reviewed with patient? Yes  SHORT TERM GOALS: Target date: 12/07/2024  Pt will be independent with HEP in order to demonstrate increased ability to perform tasks related to occupation/hobbies. Baseline: Pt given desensitization exercise and will be given formal HEP once cleared by MD to perform tasks. Goal status: INITIAL  LONG TERM GOALS: Target date: 01/04/2025  1.  Pt will decrease worst neck pain by at least 2 points on  the NPRS in order to demonstrate clinically significant reduction in neck pain. Baseline: 4/10 Goal status: INITIAL  2.  Pt will decrease NDI score by at least 19% in order demonstrate clinically significant reduction in neck pain/disability.       Baseline: 30/50; 60% Goal status: INITIAL  3.  Pt will increase strength of the UE's by at least 1/2 MMT grade in order to demonstrate improvement in strength and function. Baseline: To be determined at the next visit Goal status: INITIAL   PLAN:  PT FREQUENCY: 2x/week  PT DURATION: 8 weeks  PLANNED INTERVENTIONS: 97750- Physical Performance Testing, 97110-Therapeutic exercises, 97530- Therapeutic activity, 97112- Neuromuscular re-education, 97535- Self Care, [id number]- Manual therapy, 20560 (1-2 muscles), 20561 (3+ muscles)- Dry Needling, Patient/Family education, Joint mobilization, Spinal  mobilization, Scar mobilization, Cryotherapy, and Moist heat  PLAN FOR NEXT SESSION:   Check on clearance from MD in regards to restrictions with the current screw fracture and the plate being broken as well.   Assess compliance with desensitization exercise, and continue to work on manual therapy in order to improve tolerance and muscle extensibility    Fonda Simpers, PT, DPT Physical Therapist - [facility]  11/09/2024, 4:29 PM   "

## 2024-11-16 ENCOUNTER — Ambulatory Visit

## 2024-11-16 DIAGNOSIS — M542 Cervicalgia: Secondary | ICD-10-CM | POA: Diagnosis not present

## 2024-11-16 DIAGNOSIS — R29898 Other symptoms and signs involving the musculoskeletal system: Secondary | ICD-10-CM

## 2024-11-16 NOTE — Therapy (Signed)
 " OUTPATIENT PHYSICAL THERAPY CERVICAL TREATMENT   Patient Name: Misty Robbins MRN: [id number] DOB:[birth year], 54 y.o., female Today's Date: 11/16/2024  END OF SESSION:  PT End of Session - 11/16/24 1126     Visit Number 2    Number of Visits 17    Date for Recertification  01/04/25    PT Start Time 1122    PT Stop Time 1200    PT Time Calculation (min) 38 min    Activity Tolerance Patient tolerated treatment well          Past Medical History:  Diagnosis Date   Allergy    Anxiety    Arthritis    Asthma    Bipolar 1 disorder (HCC)    Chest pain    COPD (chronic obstructive pulmonary disease) (HCC)    Depression    GERD (gastroesophageal reflux disease)    History of kidney stones    Hyperlipidemia    Hypertension    Insomnia    Mitral valve regurgitation    Osteoporosis    Peripheral neuropathy    Pyelonephritis    Tricuspid valve regurgitation    Past Surgical History:  Procedure Laterality Date   ABDOMINAL HYSTERECTOMY  2000   APPENDECTOMY     BACK SURGERY     CERVICAL FUSION  2011,2012   c2-7   COLONOSCOPY WITH PROPOFOL  N/A 04/13/2018   Procedure: COLONOSCOPY WITH PROPOFOL ;  Surgeon: Jinny Carmine, MD;  Location: [facility];  Service: Endoscopy;  Laterality: N/A;   ESOPHAGOGASTRODUODENOSCOPY (EGD) WITH PROPOFOL  N/A 04/13/2018   Procedure: ESOPHAGOGASTRODUODENOSCOPY (EGD) WITH PROPOFOL ;  Surgeon: Jinny Carmine, MD;  Location: [facility];  Service: Endoscopy;  Laterality: N/A;   LAPAROSCOPY N/A 05/25/2018   Procedure: LAPAROSCOPY DIAGNOSTIC;  Surgeon: Lovetta Debby PARAS, MD;  Location: [facility];  Service: Gynecology;  Laterality: N/A;   LYSIS OF ADHESION N/A 05/25/2018   Procedure: LYSIS OF ADHESION;  Surgeon: Schermerhorn, Debby PARAS, MD;  Location: [facility];  Service: Gynecology;  Laterality: N/A;   POLYPECTOMY  04/13/2018   Procedure: POLYPECTOMY;  Surgeon: Jinny Carmine, MD;  Location: [facility];  Service: Endoscopy;;   SKIN  GRAFT  1981   from right thigh to bilateral ankles and right knee   SPINE SURGERY     TONSILLECTOMY  2013   TRACHELECTOMY N/A 05/25/2018   Procedure: TRACHELECTOMY;  Surgeon: Schermerhorn, Debby PARAS, MD;  Location: [facility];  Service: Gynecology;  Laterality: N/A;   TUBAL LIGATION     Patient Active Problem List   Diagnosis Date Noted   History of recent fall 09/12/2024   Tricuspid valve insufficiency 09/12/2024   Chronic neck and back pain 09/12/2024   Abnormal CT scan, cervical spine 07/01/2024   Neck pain with history of cervical spinal surgery 07/01/2024   Neck pain 07/01/2024   Primary insomnia 12/04/2022   Elevated cholesterol 12/04/2022   Menopause 12/04/2022   Bipolar and related disorder (HCC) 12/04/2022   Midline low back pain with left-sided sciatica 12/04/2022   Postsurgical menopause 12/04/2022   Primary osteoarthritis involving multiple joints 11/23/2019   Acute midline low back pain without sciatica 04/28/2019   Dysuria 03/29/2019   Cervical motion tenderness 01/15/2019   Essential hypertension 01/15/2019   Mitral valve disorder 01/15/2019   Gastroesophageal reflux disease without esophagitis 01/15/2019   Encounter for general adult medical examination with abnormal findings 01/15/2019   Postoperative state 05/25/2018   Diarrhea    Benign neoplasm of cecum    Loss of weight  Chronic gastritis     PCP: Ostwalt, Janna, PA-C   REFERRING PROVIDER: Dena Berg, MD   REFERRING DIAG: G95.9 (ICD-10-CM) - Disease of spinal cord, unspecified   THERAPY DIAG:  Painful cervical ROM  Cervical pain  Decreased ROM of neck  Rationale for Evaluation and Treatment: Rehabilitation  ONSET DATE: 09/22/24  SUBJECTIVE:                                                                                                                                                                                                         SUBJECTIVE STATEMENT:  Pt reports that she was  cleared by her MD to perform any movement, just to let pain be her guide.  Pt showed therapist the secure message with MD.  From Eval:  Pt reports that she had spinal fusion the day before Thanksgiving and was supposed to have PT services at home once being discharged, but the person was very rude and pt requested that they not come at all due to how rude they were.  Pt notes that she is seeking therapy for cervical ROM and to assist in reducing the swelling that she feels in the neck currently.    Pt has had 3 total surgeries in the neck, with the most recent being due to a fall.  Pt reports that she messed up the screws and broke the plate that was in the neck.  Pt reports the first 2 surgeries they went with an anterior approach, and the last was posterior due to the injury and needing extensive work being done.  C2-T1 fusion performed in the most recent surgery.  Hand dominance: Right  PERTINENT HISTORY:   Per MD visit on 11/04/24: Misty Robbins is a 54 year old female who presents for pain management and medication review.   She reports severe worsening of chronic pain after a recent fall while carrying groceries, which caused her leg to give way and led to a broken titanium plate and spinal cord compression. She recently had spinal surgery with staple removal a few days ago. The post surgery pain is significant and ongoing.   She is worried that injections near the recent surgical site may be unsafe and prefers a pain management approach focused on medications rather than injections, despite a referral to a pain clinic.   She has persistent shoulder pain after a fall while opening a new freezer. An orthopedic injection at [facility] did not relieve symptoms. She has been unable to use her arm for six weeks and has been mostly bedridden,  with pain radiating from her shoulder and collarbone down her back and causing headaches, which limits daily activities.   She has not started formal  physical therapy because of a negative interaction with a home health provider. She is performing her own stretches at home and plans to start outpatient physical therapy.  PAIN:  Are you having pain? Yes: NPRS scale: 4/10 Pain location: Occipital region and around C5, along with some mid thoracic pain as well. Pain description: Dull achey, throbbing Aggravating factors: movement and being upright Relieving factors: laying head down  PRECAUTIONS: Cervical;  Pt has been cleared to do activity, but advised to perform as tolerated.  Pt does have a broke plate and a fractured screw on the anterior portion of the neck that will continue to be monitored by MD moving forward.  RED FLAGS: None    WEIGHT BEARING RESTRICTIONS: No  FALLS:  Has patient fallen in last 6 months? Yes. 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  Pt unable to hold a gallon of tea and would like to be able to drive with rotation.  NEXT MD VISIT: July, 2026  OBJECTIVE:  Note: Objective measures were completed at Evaluation unless otherwise noted.  DIAGNOSTIC FINDINGS:   X-Ray Cervical Spine  Impression: 1. Posterior spinal fusion spanning C2-T1.  No hardware complication. 2.  Similar fracture of the ACDF plate at the level of C6 with slight anterior separation from the adjacent vertebral body at the level of the C5 corpectomy.  The left C4 vertebral body screw remains fracture and changed in position.   PATIENT SURVEYS:  NDI:  NECK DISABILITY INDEX  Date: 11/09/2024 Score  Pain intensity 1 = The pain is very mild at the moment  2. Personal care (washing, dressing, etc.) 1 =  I can  look after myself normally but it causes extra pain  3. Lifting 3 = Pain prevents me from lifting heavy weights but I can manage light to medium   weights if they are conveniently positioned  4. Reading 4 =  I can hardly read at all because of severe pain in my neck  5. Headaches 3 = I have moderate headaches, which come frequently  6. Concentration 3 = I have a lot of difficulty in concentrating when I want to  7. Work 2 = I can do most of my usual work, but no more  8. Driving 5 = I can't drive my car at all  9. Sleeping 3 =  My sleep is moderately disturbed (2-3 hrs sleepless)  10. Recreation 4 =  I can hardly do any recreation activities because of pain in my neck  Total 29/50   Minimum Detectable Change (90% confidence): 5 points or 10% points  COGNITION: Overall cognitive status: Within functional limits for tasks assessed  SENSATION: WFL  POSTURE: decreased thoracic kyphosis  PALPATION: Pt with significant tightness in the L paraspinals of the cervical region, along with L UT tightness noted.  Pt also having some tightness with TP's note din the periscapular region.     CERVICAL ROM:   Active ROM A/PROM (deg) eval  Flexion 14  Extension 25  Right lateral  flexion 9  Left lateral flexion 14  Right rotation 20  Left rotation 18   (Blank rows = not tested)     TREATMENT DATE: 11/16/2024  Manual:  Supine STM with TP release to the cervical paraspinals bilaterally to increase extensibility of the paraspinals Supine suboccipital release technique to decrease cervicalgia, 4 minutes x2 Supine UT/Levator stretch, 30 sec bouts to increase tissue extensibility of the cervical region Supine STM to the UE and Levator for pain modulation and increased tissue extensibility  Prone STM with TP release applied to the B periscapular regions, including subscapularis, rhomboids, and infraspinatus for pain modulation   TherEx:  Generation of HEP as noted below and pt performed a  set of each to demonstrate compliance with the exercises  Seated scapular retractions, 2x10 Seated cervical retractions, 2x10 Seated levator stretch, bilaterally, x2 each side, 30 sec bouts Seated UT stretch, bilaterally, x2 each side, 30 sec bouts Seated cervical rotation SNAG's, x10 each side Seated cervical extension SNAG's, x10   PATIENT EDUCATION:  Education details: Pt educated on role of PT and services provided during current POC, along with prognosis and information about the clinic.  Pt also educated on requesting restrictions from MD in regards to therapy due to the plate being broken and the fractured screw.  Pt also given educated as noted above regarding desensitization technique for the neck and shoulder region to calm the area down. Person educated: Patient Education method: Explanation, Demonstration, Tactile cues, and Verbal cues Education comprehension: verbalized understanding and returned demonstration  HOME EXERCISE PROGRAM:  Access Code: [email or web address] URL: [email or web address] Date: 11/16/2024 Prepared by: Sidra Simpers  Exercises - Seated Scapular Retraction  - 1 x daily - 7 x weekly - 3 sets - 10 reps - Seated Cervical Retraction  - 1 x daily - 7 x weekly - 3 sets - 10 reps - Seated Levator Scapulae Stretch  - 1 x daily - 7 x weekly - 1 sets - 2 reps - 30 hold - Seated Upper Trapezius Stretch  - 2 x daily - 7 x weekly - 1 sets - 2 reps - 30 hold - Seated Assisted Cervical Rotation with Towel  - 1 x daily - 7 x weekly - 3 sets - 10 reps  ASSESSMENT:  CLINICAL IMPRESSION:  Patient responded well to the exercises given today as well as the manual therapy approach.  Patient noted a reduction in her overall symptoms following manual therapy, with a significant improvement in her overall range of motion.  Patient feels as though the stretches and manual therapy has improved her overall range of motion since evaluation and she felt more comfortable being  able to drive.  Patient also introduced to exercises that would assist in increasing the overall range of motion in the cervical spine as well.  Patient given HEP to assist with the increased range of motion and to maintain the range that has been improved upon during therapy session.   Pt will continue to benefit from skilled therapy to address remaining deficits in order to improve overall QoL and return to PLOF.      OBJECTIVE IMPAIRMENTS: decreased activity tolerance, decreased ROM, decreased strength, hypomobility, impaired UE functional use, postural dysfunction, and pain.   ACTIVITY LIMITATIONS: carrying, lifting, bending, and hygiene/grooming  PARTICIPATION LIMITATIONS: meal prep, cleaning, laundry, driving, shopping, community activity, occupation, and yard work  PERSONAL FACTORS: Past/current experiences, Time since onset of injury/illness/exacerbation, and 3+ comorbidities: anxiety, arthritis, COPD, depression, HTN, osteoporsis are also affecting patient's functional  outcome.   REHAB POTENTIAL: Fair pt may not be able to make large improvements in the overall range of her cervical spine due to the length of her spinal fusion being from C2-T1  CLINICAL DECISION MAKING: Evolving/moderate complexity  EVALUATION COMPLEXITY: Moderate   GOALS: Goals reviewed with patient? Yes  SHORT TERM GOALS: Target date: 12/07/2024  Pt will be independent with HEP in order to demonstrate increased ability to perform tasks related to occupation/hobbies. Baseline: Pt given desensitization exercise and will be given formal HEP once cleared by MD to perform tasks. Goal status: INITIAL  LONG TERM GOALS: Target date: 01/04/2025  1.  Pt will decrease worst neck pain by at least 2 points on the NPRS in order to demonstrate clinically significant reduction in neck pain. Baseline: 4/10 Goal status: INITIAL  2.  Pt will decrease NDI score by at least 19% in order demonstrate clinically significant  reduction in neck pain/disability.       Baseline: 30/50; 60% Goal status: INITIAL  3.  Pt will increase strength of the UE's by at least 1/2 MMT grade in order to demonstrate improvement in strength and function. Baseline: To be determined at the next visit Goal status: INITIAL   PLAN:  PT FREQUENCY: 2x/week  PT DURATION: 8 weeks  PLANNED INTERVENTIONS: 97750- Physical Performance Testing, 97110-Therapeutic exercises, 97530- Therapeutic activity, 97112- Neuromuscular re-education, 97535- Self Care, [id number]- Manual therapy, 20560 (1-2 muscles), 20561 (3+ muscles)- Dry Needling, Patient/Family education, Joint mobilization, Spinal mobilization, Scar mobilization, Cryotherapy, and Moist heat  PLAN FOR NEXT SESSION:   Assess compliance with desensitization exercise, and continue to work on manual therapy in order to improve tolerance and muscle extensibility    Fonda Simpers, PT, DPT Physical Therapist - [facility]  11/16/24, 2:10 PM   "

## 2024-11-18 ENCOUNTER — Ambulatory Visit

## 2024-11-18 DIAGNOSIS — R29898 Other symptoms and signs involving the musculoskeletal system: Secondary | ICD-10-CM

## 2024-11-18 DIAGNOSIS — M542 Cervicalgia: Secondary | ICD-10-CM | POA: Diagnosis not present

## 2024-11-18 NOTE — Therapy (Signed)
 " OUTPATIENT PHYSICAL THERAPY CERVICAL TREATMENT   Patient Name: Misty Robbins MRN: [id number] DOB:[birth year], 54 y.o., female Today's Date: 11/18/2024  END OF SESSION:  PT End of Session - 11/18/24 1328     Visit Number 3    Number of Visits 17    Date for Recertification  01/04/25    PT Start Time 1328    PT Stop Time 1415    PT Time Calculation (min) 47 min    Activity Tolerance Patient tolerated treatment well         Past Medical History:  Diagnosis Date   Allergy    Anxiety    Arthritis    Asthma    Bipolar 1 disorder (HCC)    Chest pain    COPD (chronic obstructive pulmonary disease) (HCC)    Depression    GERD (gastroesophageal reflux disease)    History of kidney stones    Hyperlipidemia    Hypertension    Insomnia    Mitral valve regurgitation    Osteoporosis    Peripheral neuropathy    Pyelonephritis    Tricuspid valve regurgitation    Past Surgical History:  Procedure Laterality Date   ABDOMINAL HYSTERECTOMY  2000   APPENDECTOMY     BACK SURGERY     CERVICAL FUSION  2011,2012   c2-7   COLONOSCOPY WITH PROPOFOL  N/A 04/13/2018   Procedure: COLONOSCOPY WITH PROPOFOL ;  Surgeon: Jinny Carmine, MD;  Location: [facility];  Service: Endoscopy;  Laterality: N/A;   ESOPHAGOGASTRODUODENOSCOPY (EGD) WITH PROPOFOL  N/A 04/13/2018   Procedure: ESOPHAGOGASTRODUODENOSCOPY (EGD) WITH PROPOFOL ;  Surgeon: Jinny Carmine, MD;  Location: [facility];  Service: Endoscopy;  Laterality: N/A;   LAPAROSCOPY N/A 05/25/2018   Procedure: LAPAROSCOPY DIAGNOSTIC;  Surgeon: Lovetta Debby PARAS, MD;  Location: [facility];  Service: Gynecology;  Laterality: N/A;   LYSIS OF ADHESION N/A 05/25/2018   Procedure: LYSIS OF ADHESION;  Surgeon: Schermerhorn, Debby PARAS, MD;  Location: [facility];  Service: Gynecology;  Laterality: N/A;   POLYPECTOMY  04/13/2018   Procedure: POLYPECTOMY;  Surgeon: Jinny Carmine, MD;  Location: [facility];  Service: Endoscopy;;   SKIN  GRAFT  1981   from right thigh to bilateral ankles and right knee   SPINE SURGERY     TONSILLECTOMY  2013   TRACHELECTOMY N/A 05/25/2018   Procedure: TRACHELECTOMY;  Surgeon: Schermerhorn, Debby PARAS, MD;  Location: [facility];  Service: Gynecology;  Laterality: N/A;   TUBAL LIGATION     Patient Active Problem List   Diagnosis Date Noted   History of recent fall 09/12/2024   Tricuspid valve insufficiency 09/12/2024   Chronic neck and back pain 09/12/2024   Abnormal CT scan, cervical spine 07/01/2024   Neck pain with history of cervical spinal surgery 07/01/2024   Neck pain 07/01/2024   Primary insomnia 12/04/2022   Elevated cholesterol 12/04/2022   Menopause 12/04/2022   Bipolar and related disorder (HCC) 12/04/2022   Midline low back pain with left-sided sciatica 12/04/2022   Postsurgical menopause 12/04/2022   Primary osteoarthritis involving multiple joints 11/23/2019   Acute midline low back pain without sciatica 04/28/2019   Dysuria 03/29/2019   Cervical motion tenderness 01/15/2019   Essential hypertension 01/15/2019   Mitral valve disorder 01/15/2019   Gastroesophageal reflux disease without esophagitis 01/15/2019   Encounter for general adult medical examination with abnormal findings 01/15/2019   Postoperative state 05/25/2018   Diarrhea    Benign neoplasm of cecum    Loss of weight  Chronic gastritis     PCP: Ostwalt, Janna, PA-C   REFERRING PROVIDER: Dena Berg, MD   REFERRING DIAG: G95.9 (ICD-10-CM) - Disease of spinal cord, unspecified   THERAPY DIAG:  Painful cervical ROM  Cervical pain  Decreased ROM of neck  Rationale for Evaluation and Treatment: Rehabilitation  ONSET DATE: 09/22/24  SUBJECTIVE:                                                                                                                                                                                                         SUBJECTIVE STATEMENT:  Pt reports that she is  at a 4/10 today as far as stiffness.  Pt notes that she felt much better after the last session and had her significant other massage her as well to keep the reduction in symptoms.  From Eval:  Pt reports that she had spinal fusion the day before Thanksgiving and was supposed to have PT services at home once being discharged, but the person was very rude and pt requested that they not come at all due to how rude they were.  Pt notes that she is seeking therapy for cervical ROM and to assist in reducing the swelling that she feels in the neck currently.    Pt has had 3 total surgeries in the neck, with the most recent being due to a fall.  Pt reports that she messed up the screws and broke the plate that was in the neck.  Pt reports the first 2 surgeries they went with an anterior approach, and the last was posterior due to the injury and needing extensive work being done.  C2-T1 fusion performed in the most recent surgery.  Hand dominance: Right  PERTINENT HISTORY:   Per MD visit on 11/04/24: Misty Robbins is a 55 year old female who presents for pain management and medication review.   She reports severe worsening of chronic pain after a recent fall while carrying groceries, which caused her leg to give way and led to a broken titanium plate and spinal cord compression. She recently had spinal surgery with staple removal a few days ago. The post surgery pain is significant and ongoing.   She is worried that injections near the recent surgical site may be unsafe and prefers a pain management approach focused on medications rather than injections, despite a referral to a pain clinic.   She has persistent shoulder pain after a fall while opening a new freezer. An orthopedic injection at [facility] did not relieve symptoms. She has been unable to  use her arm for six weeks and has been mostly bedridden, with pain radiating from her shoulder and collarbone down her back and causing headaches, which  limits daily activities.   She has not started formal physical therapy because of a negative interaction with a home health provider. She is performing her own stretches at home and plans to start outpatient physical therapy.  PAIN:  Are you having pain? Yes: NPRS scale: 4/10 Pain location: Occipital region and around C5, along with some mid thoracic pain as well. Pain description: Dull achey, throbbing Aggravating factors: movement and being upright Relieving factors: laying head down  PRECAUTIONS: Cervical;  Pt has been cleared to do activity, but advised to perform as tolerated.  Pt does have a broke plate and a fractured screw on the anterior portion of the neck that will continue to be monitored by MD moving forward.  RED FLAGS: None    WEIGHT BEARING RESTRICTIONS: No  FALLS:  Has patient fallen in last 6 months? Yes. 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  Pt unable to hold a gallon of tea and would like to be able to drive with rotation.  NEXT MD VISIT: July, 2026  OBJECTIVE:  Note: Objective measures were completed at Evaluation unless otherwise noted.  DIAGNOSTIC FINDINGS:   X-Ray Cervical Spine  Impression: 1. Posterior spinal fusion spanning C2-T1.  No hardware complication. 2.  Similar fracture of the ACDF plate at the level of C6 with slight anterior separation from the adjacent vertebral body at the level of the C5 corpectomy.  The left C4 vertebral body screw remains fracture and changed in position.   PATIENT SURVEYS:  NDI:  NECK DISABILITY INDEX  Date: 11/09/2024 Score  Pain intensity 1 = The pain is very mild at the moment   2. Personal care (washing, dressing, etc.) 1 =  I can look after myself normally but it causes extra pain  3. Lifting 3 = Pain prevents me from lifting heavy weights but I can manage light to medium   weights if they are conveniently positioned  4. Reading 4 =  I can hardly read at all because of severe pain in my neck  5. Headaches 3 = I have moderate headaches, which come frequently  6. Concentration 3 = I have a lot of difficulty in concentrating when I want to  7. Work 2 = I can do most of my usual work, but no more  8. Driving 5 = I can't drive my car at all  9. Sleeping 3 =  My sleep is moderately disturbed (2-3 hrs sleepless)  10. Recreation 4 =  I can hardly do any recreation activities because of pain in my neck  Total 29/50   Minimum Detectable Change (90% confidence): 5 points or 10% points  COGNITION: Overall cognitive status: Within functional limits for tasks assessed  SENSATION: WFL  POSTURE: decreased thoracic kyphosis  PALPATION: Pt with significant tightness in the L paraspinals of the cervical region, along with L UT tightness noted.  Pt also having some tightness with TP's note din the periscapular region.     CERVICAL ROM:   Active ROM A/PROM (  deg) eval  Flexion 14  Extension 25  Right lateral flexion 9  Left lateral flexion 14  Right rotation 20  Left rotation 18   (Blank rows = not tested)     TREATMENT DATE: 11/18/2024    Manual:  Supine STM with TP release to the cervical paraspinals bilaterally to increase extensibility of the paraspinals Supine suboccipital release technique to decrease cervicalgia, 4 minutes x2 Supine UT/Levator stretch, 30 sec bouts to increase tissue extensibility of the cervical region Supine STM to the UE and Levator for pain modulation and increased tissue extensibility  Seated STM with TP release applied to the L periscapular region, including subscapularis, rhomboids, and infraspinatus for pain  modulation   TherEx:  Standing high rows at Omega, 10#, x10  Standing shoulder isometric flexion into physioball, 3 sec holds, 2x10 Standing shoulder isometric abduction into physioball, 3 sec holds, 2x10 Standing shoulder isometric adduction into physioball, 3 sec holds, 2x10 Standing shoulder isometric abduction into physioball, 3 sec holds, 2x10     PATIENT EDUCATION:  Education details: Pt educated on role of PT and services provided during current POC, along with prognosis and information about the clinic.  Pt also educated on requesting restrictions from MD in regards to therapy due to the plate being broken and the fractured screw.  Pt also given educated as noted above regarding desensitization technique for the neck and shoulder region to calm the area down. Person educated: Patient Education method: Explanation, Demonstration, Tactile cues, and Verbal cues Education comprehension: verbalized understanding and returned demonstration  HOME EXERCISE PROGRAM:  Access Code: [email or web address] URL: [email or web address] Date: 11/16/2024 Prepared by: Sidra Simpers  Exercises - Seated Scapular Retraction  - 1 x daily - 7 x weekly - 3 sets - 10 reps - Seated Cervical Retraction  - 1 x daily - 7 x weekly - 3 sets - 10 reps - Seated Levator Scapulae Stretch  - 1 x daily - 7 x weekly - 1 sets - 2 reps - 30 hold - Seated Upper Trapezius Stretch  - 2 x daily - 7 x weekly - 1 sets - 2 reps - 30 hold - Seated Assisted Cervical Rotation with Towel  - 1 x daily - 7 x weekly - 3 sets - 10 reps  ASSESSMENT:  CLINICAL IMPRESSION:  When performing high row, pt noted to have significant pain in the L shoulder and was unable to perform the exercise without modification.  Pt noted to have significant pain with the L shoulder activities, therefore was assessed and found that pt may have an underlying rotator cuff involvement.  Pt was given new exercises to target the rotator cuff  musculature and advised pt to perform within pain tolerable region.  Pt understanding and voiced compliance.  Pt overall noted to have a reduction in overall pain in the neck area and will continue to benefit from therapy to address this.  Pt may be limited in postural stability due to the weakness in the shoulders, and limited in participation due to the pain in the L shoulder with active ROM of the L shoulder.   Pt will continue to benefit from skilled therapy to address remaining deficits in order to improve overall QoL and return to PLOF.       OBJECTIVE IMPAIRMENTS: decreased activity tolerance, decreased ROM, decreased strength, hypomobility, impaired UE functional use, postural dysfunction, and pain.   ACTIVITY LIMITATIONS: carrying, lifting, bending, and hygiene/grooming  PARTICIPATION LIMITATIONS: meal prep, cleaning, laundry, driving, shopping, community activity, occupation,  and yard work  PERSONAL FACTORS: Past/current experiences, Time since onset of injury/illness/exacerbation, and 3+ comorbidities: anxiety, arthritis, COPD, depression, HTN, osteoporsis are also affecting patient's functional outcome.   REHAB POTENTIAL: Fair pt may not be able to make large improvements in the overall range of her cervical spine due to the length of her spinal fusion being from C2-T1  CLINICAL DECISION MAKING: Evolving/moderate complexity  EVALUATION COMPLEXITY: Moderate   GOALS: Goals reviewed with patient? Yes  SHORT TERM GOALS: Target date: 12/07/2024  Pt will be independent with HEP in order to demonstrate increased ability to perform tasks related to occupation/hobbies. Baseline: Pt given desensitization exercise and will be given formal HEP once cleared by MD to perform tasks. Goal status: INITIAL  LONG TERM GOALS: Target date: 01/04/2025  1.  Pt will decrease worst neck pain by at least 2 points on the NPRS in order to demonstrate clinically significant reduction in neck  pain. Baseline: 4/10 Goal status: INITIAL  2.  Pt will decrease NDI score by at least 19% in order demonstrate clinically significant reduction in neck pain/disability.       Baseline: 30/50; 60% Goal status: INITIAL  3.  Pt will increase strength of the UE's by at least 1/2 MMT grade in order to demonstrate improvement in strength and function. Baseline: To be determined at the next visit Goal status: INITIAL   PLAN:  PT FREQUENCY: 2x/week  PT DURATION: 8 weeks  PLANNED INTERVENTIONS: 97750- Physical Performance Testing, 97110-Therapeutic exercises, 97530- Therapeutic activity, 97112- Neuromuscular re-education, 97535- Self Care, [id number]- Manual therapy, 20560 (1-2 muscles), 20561 (3+ muscles)- Dry Needling, Patient/Family education, Joint mobilization, Spinal mobilization, Scar mobilization, Cryotherapy, and Moist heat  PLAN FOR NEXT SESSION:   Assess compliance with desensitization exercise, and continue to work on manual therapy in order to improve tolerance and muscle extensibility    Fonda Simpers, PT, DPT Physical Therapist - [facility]  11/18/24, 2:14 PM   "

## 2024-11-23 ENCOUNTER — Other Ambulatory Visit: Payer: Self-pay | Admitting: Physician Assistant

## 2024-11-23 DIAGNOSIS — R52 Pain, unspecified: Secondary | ICD-10-CM

## 2024-11-30 ENCOUNTER — Other Ambulatory Visit: Payer: Self-pay | Admitting: Physician Assistant

## 2024-11-30 ENCOUNTER — Ambulatory Visit

## 2024-11-30 DIAGNOSIS — I10 Essential (primary) hypertension: Secondary | ICD-10-CM

## 2024-12-02 ENCOUNTER — Other Ambulatory Visit: Payer: Self-pay

## 2024-12-02 ENCOUNTER — Other Ambulatory Visit (HOSPITAL_COMMUNITY): Payer: Self-pay

## 2024-12-02 ENCOUNTER — Telehealth: Payer: Self-pay

## 2024-12-02 ENCOUNTER — Other Ambulatory Visit: Payer: Self-pay | Admitting: Physician Assistant

## 2024-12-02 ENCOUNTER — Ambulatory Visit

## 2024-12-02 DIAGNOSIS — M542 Cervicalgia: Secondary | ICD-10-CM

## 2024-12-02 DIAGNOSIS — Z780 Asymptomatic menopausal state: Secondary | ICD-10-CM

## 2024-12-02 DIAGNOSIS — R29898 Other symptoms and signs involving the musculoskeletal system: Secondary | ICD-10-CM

## 2024-12-02 MED ORDER — ESTROGENS CONJUGATED 0.3 MG PO TABS: ORAL_TABLET | ORAL | 0 refills | Status: AC

## 2024-12-02 NOTE — Therapy (Signed)
 " OUTPATIENT PHYSICAL THERAPY CERVICAL TREATMENT   Patient Name: Misty Robbins MRN: [id number] DOB:07-04-1971, 54 y.o., female Today's Date: 12/02/2024  END OF SESSION:  PT End of Session - 12/02/24 0858     Visit Number 4    Number of Visits 17    Date for Recertification  01/04/25    PT Start Time 0900    PT Stop Time 0943    PT Time Calculation (min) 43 min          Past Medical History:  Diagnosis Date   Allergy    Anxiety    Arthritis    Asthma    Bipolar 1 disorder (HCC)    Chest pain    COPD (chronic obstructive pulmonary disease) (HCC)    Depression    GERD (gastroesophageal reflux disease)    History of kidney stones    Hyperlipidemia    Hypertension    Insomnia    Mitral valve regurgitation    Osteoporosis    Peripheral neuropathy    Pyelonephritis    Tricuspid valve regurgitation    Past Surgical History:  Procedure Laterality Date   ABDOMINAL HYSTERECTOMY  2000   APPENDECTOMY     BACK SURGERY     CERVICAL FUSION  2011,2012   c2-7   COLONOSCOPY WITH PROPOFOL  N/A 04/13/2018   Procedure: COLONOSCOPY WITH PROPOFOL ;  Surgeon: Jinny Carmine, MD;  Location: [facility];  Service: Endoscopy;  Laterality: N/A;   ESOPHAGOGASTRODUODENOSCOPY (EGD) WITH PROPOFOL  N/A 04/13/2018   Procedure: ESOPHAGOGASTRODUODENOSCOPY (EGD) WITH PROPOFOL ;  Surgeon: Jinny Carmine, MD;  Location: [facility];  Service: Endoscopy;  Laterality: N/A;   LAPAROSCOPY N/A 05/25/2018   Procedure: LAPAROSCOPY DIAGNOSTIC;  Surgeon: Lovetta Debby PARAS, MD;  Location: [facility];  Service: Gynecology;  Laterality: N/A;   LYSIS OF ADHESION N/A 05/25/2018   Procedure: LYSIS OF ADHESION;  Surgeon: Schermerhorn, Debby PARAS, MD;  Location: [facility];  Service: Gynecology;  Laterality: N/A;   POLYPECTOMY  04/13/2018   Procedure: POLYPECTOMY;  Surgeon: Jinny Carmine, MD;  Location: [facility];  Service: Endoscopy;;   SKIN GRAFT  1981   from right thigh to bilateral ankles and  right knee   SPINE SURGERY     TONSILLECTOMY  2013   TRACHELECTOMY N/A 05/25/2018   Procedure: TRACHELECTOMY;  Surgeon: Schermerhorn, Debby PARAS, MD;  Location: [facility];  Service: Gynecology;  Laterality: N/A;   TUBAL LIGATION     Patient Active Problem List   Diagnosis Date Noted   History of recent fall 09/12/2024   Tricuspid valve insufficiency 09/12/2024   Chronic neck and back pain 09/12/2024   Abnormal CT scan, cervical spine 07/01/2024   Neck pain with history of cervical spinal surgery 07/01/2024   Neck pain 07/01/2024   Primary insomnia 12/04/2022   Elevated cholesterol 12/04/2022   Menopause 12/04/2022   Bipolar and related disorder (HCC) 12/04/2022   Midline low back pain with left-sided sciatica 12/04/2022   Postsurgical menopause 12/04/2022   Primary osteoarthritis involving multiple joints 11/23/2019   Acute midline low back pain without sciatica 04/28/2019   Dysuria 03/29/2019   Cervical motion tenderness 01/15/2019   Essential hypertension 01/15/2019   Mitral valve disorder 01/15/2019   Gastroesophageal reflux disease without esophagitis 01/15/2019   Encounter for general adult medical examination with abnormal findings 01/15/2019   Postoperative state 05/25/2018   Diarrhea    Benign neoplasm of cecum    Loss of weight    Chronic gastritis  PCP: Ostwalt, Janna, PA-C   REFERRING PROVIDER: Dena Berg, MD   REFERRING DIAG: G95.9 (ICD-10-CM) - Disease of spinal cord, unspecified   THERAPY DIAG:  Painful cervical ROM  Cervical pain  Decreased ROM of neck  Rationale for Evaluation and Treatment: Rehabilitation  ONSET DATE: 09/22/24  SUBJECTIVE:                                                                                                                                                                                                         SUBJECTIVE STATEMENT:  Pt reports 6/10 at base of neck upon arrival into clinic today.  Pt notes this  might be due to the weather. Pt reports being accepted into pain clinic and is excited about that.   From Eval:  Pt reports that she had spinal fusion the day before Thanksgiving and was supposed to have PT services at home once being discharged, but the person was very rude and pt requested that they not come at all due to how rude they were.  Pt notes that she is seeking therapy for cervical ROM and to assist in reducing the swelling that she feels in the neck currently.    Pt has had 3 total surgeries in the neck, with the most recent being due to a fall.  Pt reports that she messed up the screws and broke the plate that was in the neck.  Pt reports the first 2 surgeries they went with an anterior approach, and the last was posterior due to the injury and needing extensive work being done.  C2-T1 fusion performed in the most recent surgery.  Hand dominance: Right  PERTINENT HISTORY:   Per MD visit on 11/04/24: Misty Robbins is a 54 year old female who presents for pain management and medication review.   She reports severe worsening of chronic pain after a recent fall while carrying groceries, which caused her leg to give way and led to a broken titanium plate and spinal cord compression. She recently had spinal surgery with staple removal a few days ago. The post surgery pain is significant and ongoing.   She is worried that injections near the recent surgical site may be unsafe and prefers a pain management approach focused on medications rather than injections, despite a referral to a pain clinic.   She has persistent shoulder pain after a fall while opening a new freezer. An orthopedic injection at [facility] did not relieve symptoms. She has been unable to use her arm for six weeks and has been mostly bedridden,  with pain radiating from her shoulder and collarbone down her back and causing headaches, which limits daily activities.   She has not started formal physical therapy because  of a negative interaction with a home health provider. She is performing her own stretches at home and plans to start outpatient physical therapy.  PAIN:  Are you having pain? Yes: NPRS scale: 4/10 Pain location: Occipital region and around C5, along with some mid thoracic pain as well. Pain description: Dull achey, throbbing Aggravating factors: movement and being upright Relieving factors: laying head down  PRECAUTIONS: Cervical;  Pt has been cleared to do activity, but advised to perform as tolerated.  Pt does have a broke plate and a fractured screw on the anterior portion of the neck that will continue to be monitored by MD moving forward.  RED FLAGS: None    WEIGHT BEARING RESTRICTIONS: No  FALLS:  Has patient fallen in last 6 months? Yes. 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  Pt unable to hold a gallon of tea and would like to be able to drive with rotation.  NEXT MD VISIT: July, 2026  OBJECTIVE:  Note: Objective measures were completed at Evaluation unless otherwise noted.  DIAGNOSTIC FINDINGS:   X-Ray Cervical Spine  Impression: 1. Posterior spinal fusion spanning C2-T1.  No hardware complication. 2.  Similar fracture of the ACDF plate at the level of C6 with slight anterior separation from the adjacent vertebral body at the level of the C5 corpectomy.  The left C4 vertebral body screw remains fracture and changed in position.   PATIENT SURVEYS:  NDI:  NECK DISABILITY INDEX  Date: 11/09/2024 Score  Pain intensity 1 = The pain is very mild at the moment  2. Personal care (washing, dressing, etc.) 1 =  I can look after myself normally  but it causes extra pain  3. Lifting 3 = Pain prevents me from lifting heavy weights but I can manage light to medium   weights if they are conveniently positioned  4. Reading 4 =  I can hardly read at all because of severe pain in my neck  5. Headaches 3 = I have moderate headaches, which come frequently  6. Concentration 3 = I have a lot of difficulty in concentrating when I want to  7. Work 2 = I can do most of my usual work, but no more  8. Driving 5 = I can't drive my car at all  9. Sleeping 3 =  My sleep is moderately disturbed (2-3 hrs sleepless)  10. Recreation 4 =  I can hardly do any recreation activities because of pain in my neck  Total 29/50   Minimum Detectable Change (90% confidence): 5 points or 10% points  COGNITION: Overall cognitive status: Within functional limits for tasks assessed  SENSATION: WFL  POSTURE: decreased thoracic kyphosis  PALPATION: Pt with significant tightness in the L paraspinals of the cervical region, along with L UT tightness noted.  Pt also having some tightness with TP's note din the periscapular region.     CERVICAL ROM:   Active ROM A/PROM (deg) eval  Flexion 14  Extension 25  Right lateral  flexion 9  Left lateral flexion 14  Right rotation 20  Left rotation 18   (Blank rows = not tested)     TREATMENT DATE: 12/02/2024  Manual:  Supine STM with TP release to the cervical paraspinals bilaterally to increase extensibility of the paraspinals Supine suboccipital release technique to decrease cervicalgia, 4 minutes x2 Supine UT/Levator stretch, 30 sec bouts to increase tissue extensibility of the cervical region Supine STM to the UE and Levator for pain modulation and increased tissue extensibility  Seated STM with TP release applied to the L periscapular region, including subscapularis, rhomboids, and infraspinatus for pain modulation   TherEx: Supine Chin tucks with 3 sec hold, 2 x 10 Supine cervical rotation with 3 sec hold  at end range, 2 x 10  Standing wall angels 2 x 10 Standing serratus rolls on blue foam roller, 2 x 10    PATIENT EDUCATION:  Education details: Pt educated on role of PT and services provided during current POC, along with prognosis and information about the clinic.  Pt also educated on requesting restrictions from MD in regards to therapy due to the plate being broken and the fractured screw.  Pt also given educated as noted above regarding desensitization technique for the neck and shoulder region to calm the area down. Person educated: Patient Education method: Explanation, Demonstration, Tactile cues, and Verbal cues Education comprehension: verbalized understanding and returned demonstration  HOME EXERCISE PROGRAM:  Access Code: [email or web address] URL: [email or web address] Date: 11/16/2024 Prepared by: Sidra Simpers  Exercises - Seated Scapular Retraction  - 1 x daily - 7 x weekly - 3 sets - 10 reps - Seated Cervical Retraction  - 1 x daily - 7 x weekly - 3 sets - 10 reps - Seated Levator Scapulae Stretch  - 1 x daily - 7 x weekly - 1 sets - 2 reps - 30 hold - Seated Upper Trapezius Stretch  - 2 x daily - 7 x weekly - 1 sets - 2 reps - 30 hold - Seated Assisted Cervical Rotation with Towel  - 1 x daily - 7 x weekly - 3 sets - 10 reps  ASSESSMENT:  CLINICAL IMPRESSION:  When performing UT stretch pt unable to put right arm behind her back. When performing Levator scap stretch pt reports looking down is the hardest thing for her to do. Pt overall noted to have a reduction in overall pain in the neck area from 6/10 to 3-4/10 and will continue to benefit from therapy to address this.  Pt may be limited in postural stability due to the weakness in the shoulders, and limited in participation due to the pain in the L shoulder with active ROM of the L shoulder.   Pt will continue to benefit from skilled therapy to address remaining deficits in order to improve overall QoL and return to  PLOF.     OBJECTIVE IMPAIRMENTS: decreased activity tolerance, decreased ROM, decreased strength, hypomobility, impaired UE functional use, postural dysfunction, and pain.   ACTIVITY LIMITATIONS: carrying, lifting, bending, and hygiene/grooming  PARTICIPATION LIMITATIONS: meal prep, cleaning, laundry, driving, shopping, community activity, occupation, and yard work  PERSONAL FACTORS: Past/current experiences, Time since onset of injury/illness/exacerbation, and 3+ comorbidities: anxiety, arthritis, COPD, depression, HTN, osteoporsis are also affecting patient's functional outcome.   REHAB POTENTIAL: Fair pt may not be able to make large improvements in the overall range of her cervical spine due to the length of her spinal fusion being from C2-T1  CLINICAL DECISION MAKING: Evolving/moderate complexity  EVALUATION COMPLEXITY: Moderate   GOALS: Goals reviewed with patient? Yes  SHORT TERM GOALS: Target date: 12/07/2024  Pt will be independent with HEP in order to demonstrate increased ability to perform tasks related to occupation/hobbies. Baseline: Pt given desensitization exercise and will be given formal HEP once cleared by MD to perform tasks. Goal status: INITIAL  LONG TERM GOALS: Target date: 01/04/2025  1.  Pt will decrease worst neck pain by at least 2 points on the NPRS in order to demonstrate clinically significant reduction in neck pain. Baseline: 4/10 Goal status: INITIAL  2.  Pt will decrease NDI score by at least 19% in order demonstrate clinically significant reduction in neck pain/disability.       Baseline: 30/50; 60% Goal status: INITIAL  3.  Pt will increase strength of the UE's by at least 1/2 MMT grade in order to demonstrate improvement in strength and function. Baseline: To be determined at the next visit Goal status: INITIAL   PLAN:  PT FREQUENCY: 2x/week  PT DURATION: 8 weeks  PLANNED INTERVENTIONS: 97750- Physical Performance Testing,  97110-Therapeutic exercises, 97530- Therapeutic activity, 97112- Neuromuscular re-education, 97535- Self Care, [id number]- Manual therapy, 20560 (1-2 muscles), 20561 (3+ muscles)- Dry Needling, Patient/Family education, Joint mobilization, Spinal mobilization, Scar mobilization, Cryotherapy, and Moist heat  PLAN FOR NEXT SESSION:   Continue to work on manual therapy in order to improve tolerance and muscle extensibility; focus on Rotator cuff involvement    Laymon GORMAN Perfect, PT, DT Physical Therapist - [place]  [facility]   12/02/24, 9:45 AM   "

## 2024-12-02 NOTE — Telephone Encounter (Signed)
 Pharmacy Patient Advocate Encounter   Received notification from Physician's Office that prior authorization for ESTROGENS  CONJUGATED  (PREMARIN ) is required/requested.   Insurance verification completed.   The patient is insured through [employer].   Per test claim: The current 30 day co-pay is, $1.60.  No PA needed at this time. This test claim was processed through [facility]- copay amounts may vary at other pharmacies due to pharmacy/plan contracts, or as the patient moves through the different stages of their insurance plan.

## 2024-12-07 ENCOUNTER — Ambulatory Visit

## 2024-12-09 ENCOUNTER — Ambulatory Visit

## 2024-12-14 ENCOUNTER — Ambulatory Visit

## 2024-12-16 ENCOUNTER — Ambulatory Visit

## 2024-12-20 ENCOUNTER — Ambulatory Visit

## 2024-12-22 ENCOUNTER — Ambulatory Visit

## 2024-12-28 ENCOUNTER — Ambulatory Visit

## 2024-12-30 ENCOUNTER — Ambulatory Visit

## 2025-01-03 ENCOUNTER — Ambulatory Visit

## 2025-01-05 ENCOUNTER — Ambulatory Visit

## 2025-04-20 ENCOUNTER — Ambulatory Visit

## 2025-08-31 ENCOUNTER — Ambulatory Visit
